# Patient Record
Sex: Female | Born: 1935 | Race: Black or African American | Hispanic: No | Marital: Single | Smoking: Never smoker
Health system: Southern US, Community
[De-identification: ages and names within clinical notes are randomized; demographics above are authoritative.]

## PROBLEM LIST (undated history)

## (undated) DIAGNOSIS — I10 Essential (primary) hypertension: Secondary | ICD-10-CM

## (undated) DIAGNOSIS — I4891 Unspecified atrial fibrillation: Secondary | ICD-10-CM

## (undated) DIAGNOSIS — I8290 Acute embolism and thrombosis of unspecified vein: Secondary | ICD-10-CM

## (undated) DIAGNOSIS — I639 Cerebral infarction, unspecified: Secondary | ICD-10-CM

## (undated) DIAGNOSIS — D649 Anemia, unspecified: Secondary | ICD-10-CM

## (undated) DIAGNOSIS — M1990 Unspecified osteoarthritis, unspecified site: Secondary | ICD-10-CM

## (undated) DIAGNOSIS — I82409 Acute embolism and thrombosis of unspecified deep veins of unspecified lower extremity: Secondary | ICD-10-CM

## (undated) DIAGNOSIS — E785 Hyperlipidemia, unspecified: Secondary | ICD-10-CM

## (undated) DIAGNOSIS — I2510 Atherosclerotic heart disease of native coronary artery without angina pectoris: Secondary | ICD-10-CM

## (undated) DIAGNOSIS — K219 Gastro-esophageal reflux disease without esophagitis: Secondary | ICD-10-CM

## (undated) HISTORY — DX: Hyperlipidemia, unspecified: E78.5

## (undated) HISTORY — DX: Unspecified atrial fibrillation: I48.91

## (undated) HISTORY — DX: Atherosclerotic heart disease of native coronary artery without angina pectoris: I25.10

## (undated) HISTORY — DX: Acute embolism and thrombosis of unspecified vein: I82.90

## (undated) HISTORY — DX: Acute embolism and thrombosis of unspecified deep veins of unspecified lower extremity: I82.409

## (undated) HISTORY — DX: Cerebral infarction, unspecified: I63.9

## (undated) HISTORY — PX: CHOLECYSTECTOMY: SHX55

## (undated) HISTORY — DX: Essential (primary) hypertension: I10

## (undated) HISTORY — DX: Unspecified osteoarthritis, unspecified site: M19.90

## (undated) HISTORY — PX: OTHER SURGICAL HISTORY: SHX169

---

## 1962-12-05 HISTORY — PX: ABDOMINAL HYSTERECTOMY: SHX81

## 1998-04-07 ENCOUNTER — Encounter: Admission: RE | Admit: 1998-04-07 | Discharge: 1998-04-07 | Payer: Self-pay | Admitting: Family Medicine

## 1998-04-09 ENCOUNTER — Ambulatory Visit (HOSPITAL_COMMUNITY): Admission: RE | Admit: 1998-04-09 | Discharge: 1998-04-09 | Payer: Self-pay | Admitting: *Deleted

## 1998-04-20 ENCOUNTER — Encounter: Admission: RE | Admit: 1998-04-20 | Discharge: 1998-04-20 | Payer: Self-pay | Admitting: Family Medicine

## 1998-04-21 ENCOUNTER — Encounter: Admission: RE | Admit: 1998-04-21 | Discharge: 1998-04-21 | Payer: Self-pay | Admitting: Family Medicine

## 1998-07-02 ENCOUNTER — Encounter: Admission: RE | Admit: 1998-07-02 | Discharge: 1998-07-02 | Payer: Self-pay | Admitting: Family Medicine

## 1998-07-03 ENCOUNTER — Ambulatory Visit: Admission: RE | Admit: 1998-07-03 | Discharge: 1998-07-03 | Payer: Self-pay | Admitting: Sports Medicine

## 1998-08-06 ENCOUNTER — Ambulatory Visit (HOSPITAL_COMMUNITY): Admission: RE | Admit: 1998-08-06 | Discharge: 1998-08-06 | Payer: Self-pay | Admitting: Cardiovascular Disease

## 1998-09-01 ENCOUNTER — Encounter: Admission: RE | Admit: 1998-09-01 | Discharge: 1998-09-01 | Payer: Self-pay | Admitting: Family Medicine

## 1999-12-21 ENCOUNTER — Encounter: Admission: RE | Admit: 1999-12-21 | Discharge: 1999-12-21 | Payer: Self-pay | Admitting: Sports Medicine

## 1999-12-27 ENCOUNTER — Encounter: Admission: RE | Admit: 1999-12-27 | Discharge: 1999-12-27 | Payer: Self-pay | Admitting: Family Medicine

## 2000-01-05 ENCOUNTER — Encounter: Admission: RE | Admit: 2000-01-05 | Discharge: 2000-01-05 | Payer: Self-pay | Admitting: Family Medicine

## 2000-01-07 ENCOUNTER — Encounter: Admission: RE | Admit: 2000-01-07 | Discharge: 2000-01-07 | Payer: Self-pay | Admitting: Family Medicine

## 2000-01-13 ENCOUNTER — Encounter: Admission: RE | Admit: 2000-01-13 | Discharge: 2000-01-13 | Payer: Self-pay | Admitting: Family Medicine

## 2000-01-13 ENCOUNTER — Other Ambulatory Visit: Admission: RE | Admit: 2000-01-13 | Discharge: 2000-01-13 | Payer: Self-pay | Admitting: *Deleted

## 2000-01-25 ENCOUNTER — Encounter: Admission: RE | Admit: 2000-01-25 | Discharge: 2000-01-25 | Payer: Self-pay | Admitting: *Deleted

## 2000-01-25 ENCOUNTER — Encounter: Payer: Self-pay | Admitting: Sports Medicine

## 2002-10-29 ENCOUNTER — Encounter: Payer: Self-pay | Admitting: Emergency Medicine

## 2002-10-29 ENCOUNTER — Emergency Department (HOSPITAL_COMMUNITY): Admission: EM | Admit: 2002-10-29 | Discharge: 2002-10-29 | Payer: Self-pay | Admitting: Emergency Medicine

## 2004-01-12 ENCOUNTER — Encounter: Admission: RE | Admit: 2004-01-12 | Discharge: 2004-01-12 | Payer: Self-pay | Admitting: Cardiovascular Disease

## 2005-09-30 ENCOUNTER — Inpatient Hospital Stay (HOSPITAL_COMMUNITY): Admission: EM | Admit: 2005-09-30 | Discharge: 2005-10-07 | Payer: Self-pay | Admitting: Family Medicine

## 2007-04-26 ENCOUNTER — Emergency Department (HOSPITAL_COMMUNITY): Admission: EM | Admit: 2007-04-26 | Discharge: 2007-04-26 | Payer: Self-pay | Admitting: Emergency Medicine

## 2007-12-10 ENCOUNTER — Inpatient Hospital Stay (HOSPITAL_COMMUNITY): Admission: EM | Admit: 2007-12-10 | Discharge: 2007-12-15 | Payer: Self-pay | Admitting: *Deleted

## 2007-12-13 ENCOUNTER — Ambulatory Visit: Payer: Self-pay | Admitting: Vascular Surgery

## 2011-04-22 NOTE — Discharge Summary (Signed)
[name], [name]             ACCOUNT NO.:  [id number]   MEDICAL RECORD NO.:  [id number]          PATIENT TYPE:  INP   LOCATION:  3728                         FACILITY:  [facility]   PHYSICIAN:  Ricki Rodriguez, M.D.  DATE OF BIRTH:  [birth year]   DATE OF ADMISSION:  09/30/2005  DATE OF DISCHARGE:  10/07/2005                                 DISCHARGE SUMMARY   PRINCIPAL DIAGNOSES:  1.  Lower extremity venous thromboembolism.  2.  Paroxysmal ventricular tachycardia.  3.  Hypertension.  4.  Anemia.  5.  Long-term use of anticoagulant.  6.  Constipation.   DISCHARGE MEDICATIONS:  1.  Lipitor 20 mg, one daily.  2.  K-Dur 10 mEq, one daily.  3.  Lotensin 5 mg, one daily.  4.  Coumadin 5 mg, one daily.  5.  Lasix 20 mg on Monday, Wednesday and Friday.  6.  Ferrous sulfate 325 mg, one twice daily.  7.  Prevacid 15 mg daily.  8.  Lasix is now up the dose and will be three times a week.  9.  Continue a multivitamin one daily.  10. Tums, two daily.  11. Actonel 35 mg, one a week.  12. Pain management with Darvocet-N 100, up to four times daily p.r.n.   NOTATION:  The patient is to stop Benicar and Lotrel.   DIET:  A low-fat, low-salt, non-vitamin K diet.  No green vegetables.   ACTIVITY:  Increase activity slowly.  Use crutches as needed.   WOUND CARE:  Not applicable.   SPECIAL INSTRUCTIONS:  Get a prothrombin time in three days, in one week and  in one month.   FOLLOWUP:  Follow up with Dr. Ricki Rodriguez in 10 days.  The patient will  call [phone] for an appointment.   HISTORY OF PRESENT ILLNESS:  This 77 year old black female comes in with a  three-day history of left leg swelling and pain without chest pain,  shortness of breath or sweating spell.   PHYSICAL EXAMINATION:  VITAL SIGNS:  Pulse 60, respirations 14, blood  pressure 130/90.  The patient is 5 feet 7 inches tall and weighs 172 pounds.  GENERAL:  The patient was alert and oriented x3.  HEENT:  The patient  is normocephalic and atraumatic.  She wears glasses.  She has brown eyes.  Conjunctivae pink.  Sclerae white.  NECK:  No jugular vein distention, bilateral carotid bruit, left more than  the right.  Has a full range of motion of the neck.  LUNGS:  Clear bilaterally.  HEART:  Normal S1, S2, with a grade 2/6 systolic ejection murmur.  ABDOMEN:  Soft, nontender.  EXTREMITIES:  With 1-2+ edema on the right and 2-3+ edema of the left leg  with warmth and tenderness over the calf area.  No cyanosis or clubbing.  Severe arthritic changes of the left knee.  CNS:  The cranial nerves are grossly intact. Bilateral good grips.  The  patient is right-handed.   LABORATORY DATA:  Hemoglobin 10.2, hematocrit 30.  Normal WBC count,  platelet count.  Normal PT, INR.  Subsequent PT was 22  with an INR of 1.9.  Electrolytes are normal.  Glucose borderline 101.  Creatinine borderline at  1.3.  BUN 23.  Lipid profile showed a total cholesterol of 157,  triglycerides 71, HDL cholesterol 49, LDL cholesterol of 94.  TSH 2.7.  Iron  level low at 35 with 19% saturation.   Her chest x-ray showed no active disease.   Electrocardiogram showed a sinus bradycardia with an anteroseptal  infarction.   Doppler of the left lower extremity showed evidence of a deep venous  thrombosis in the posterior tibial vein to popliteal vein to the mid-femoral  vein.   HOSPITAL COURSE:  The patient was admitted to the telemetry unit.  She was  started on subcutaneous Lovenox 80 mg b.i.d. Her condition improved over 72  hours of hospitalization.  Her Coumadin dose was adjusted to increase the  INR to 1.9.  With evidence of bradycardia, her Toprol was discontinued on  October 06, 2005.  The ferrous sulfate was added for iron deficiency anemia.   DISPOSITION:  The patient will be encouraged to have an outpatient GI  consultation.  On October 07, 2005, she was discharged home in satisfactory  condition, with a significant  improvement in her left leg swelling and  tenderness.  She will be followed by me and will have periodic prothrombin  time checked.      Ricki Rodriguez, M.D.  Electronically Signed     ASK/MEDQ  D:  01/05/2006  T:  01/06/2006  Job:  [id number]

## 2011-04-22 NOTE — H&P (Signed)
NAMEKIMBERLIN, SCHEEL             ACCOUNT NO.:  [id number]   MEDICAL RECORD NO.:  [id number]          PATIENT TYPE:  OBV   LOCATION:  3728                         FACILITY:  [facility]   PHYSICIAN:  Ricki Rodriguez, M.D.  DATE OF BIRTH:  06/17/1925   DATE OF ADMISSION:  09/30/2005  DATE OF DISCHARGE:                                HISTORY & PHYSICAL   CHIEF COMPLAINT:  Left leg pain.   HISTORY OF PRESENT ILLNESS:  This 77 year old black female has a 3-day  history of leg swelling and pain without chest pain, shortness of breath, or  sweating spells.   PAST MEDICAL HISTORY:  Negative for diabetes. Positive for hypertension for  25 years. No history of smoking, no history of alcohol intake. Positive  history of elevated cholesterol level, myocardial infarction, and family  history of premature coronary artery disease.   PAST SURGICAL HISTORY:  Includes total hysterectomy in 1964 and cardiac  catheterization in 1999.   CURRENT MEDICATIONS:  1.  Enteric-coated aspirin 325 mg daily.  2.  Tylenol one daily.  3.  Multivitamin one daily.  4.  Tums one daily.  5.  Lotrel 5/10 mg one daily.  6.  Benicar 20 mg one daily.  7.  Lasix 40 mg one daily.  8.  K-Dur 10 mEq one daily.  9.  Lipitor 10 mg one daily.  10. Prevacid 15 mg one daily.  11. Darvocet-N 100 one four times daily as needed.  12. Actonel 35 mg one a week.   ALLERGIES:  Some ANTIBIOTIC a long time ago; otherwise, no known drug  allergies.   PERSONAL HISTORY:  The patient is single. Had three sons, two living age of  31 and 84, one died. Had one daughter stillborn and one daughter deceased,  one living.   FAMILY HISTORY:  Mother and father died of old age. Had two brothers, both  of whom died; one died 2 years ago, one died 10 years ago. Has one sister  who is living.   REVIEW OF SYSTEMS:  Positive vision change with the patient wearing glasses.  Negative weight gain or weight loss. Negative hearing loss. Positive  denture  use. Negative asthma, bronchitis, hemoptysis, or pneumonia. Positive  palpitations, chest pain, congestive heart failure with leg edema. Negative  GI bleed, peptic ulcer disease, hepatitis, blood transfusion. Negative  kidney stone, stroke, seizures, or psychiatric admissions.   PHYSICAL EXAMINATION:  VITAL SIGNS:  Pulse 60, respirations 14, blood  pressure 130/90, height 5 feet 7 inches, weight 172 pounds approximately.  GENERAL:  The patient was alert, oriented x3.  HEENT:  The patient is normocephalic, atraumatic, wears glasses, brown eyes.  Conjunctivae pink sclerae white.  NECK:  No JVD.  Bilateral carotid bruit, left more than the right. Has full  range of motion on the neck.  LUNGS:  Clear bilaterally.  HEART:  Normal S1, S2, with a grade 2/6 systolic ejection murmur.  ABDOMEN:  Soft and nontender.  EXTREMITIES:  Show 1-2+ edema right and 2-3+ edema on the left leg, with  warm and tenderness over the calf area. No cyanosis or  clubbing. Severe  arthritic changes of the left knee.  CENTRAL NERVOUS SYSTEM:  Cranial nerves grossly intact, bilateral equal  grips. The patient is right-handed.   LABORATORY DATA:  Pending.   Doppler of the left lower extremity positive for deep venous thrombosis.   ASSESSMENT:  1.  Left leg thrombophlebitis.  2.  Hypertension.  3.  Hyperlipidemia.  4.  Bilateral knee pain.  5.  Anemia.   PLAN:  Start subcutaneous Lovenox 80 mg every 12 hours and continue home  medications.      Ricki Rodriguez, M.D.  Electronically Signed     ASK/MEDQ  D:  09/30/2005  T:  10/01/2005  Job:  [id number]

## 2011-04-22 NOTE — Discharge Summary (Signed)
[name], [name]             ACCOUNT NO.:  [id number]   MEDICAL RECORD NO.:  [id number]          PATIENT TYPE:  INP   LOCATION:  6526                         FACILITY:  [facility]   PHYSICIAN:  Ricki Rodriguez, M.D.  DATE OF BIRTH:  [birth year]   DATE OF ADMISSION:  12/10/2007  DATE OF DISCHARGE:  12/15/2007                               DISCHARGE SUMMARY   FINAL DIAGNOSES:  1. Atrial fibrillation.  2. Urinary tract infection.  3. Hypertension.  4. Hyperlipidemia.  5. Contusion of the face.  6. Fall.   DISCHARGE MEDICATIONS:  1. Cipro 250 mg twice daily x5 days.  2. Protonix 40 mg daily.  3. Lipitor 10 mg in the evening.  4. Coumadin 2.5 mg in the evening.  5. Diltiazem 180 mg extended release one daily in the morning.  6. Toprol XL 25 mg one daily in the evening.  7. Tylenol 650 mg up to four times daily as needed.  8. Lasix (furosemide) 20 mg once or twice a week as needed for leg      swelling.  9. KCl 10 mEq once or twice a week as needed with Lasix use.   DISCHARGE DIET:  Low-sodium heart-healthy diet.   DISCHARGE ACTIVITY:  The patient to walk with assistance.   SPECIAL INSTRUCTIONS:  The patient to stop any activity that causes  chest pain, shortness of breath, dizziness, sweating or excessive  weakness.   FOLLOWUP:  With Dr. Orpah Cobb in 1 week.  The patient or family to  call [phone] for appointment.   HISTORY:  This 77 year old white female presented with a fall.  EMS  found heart rate into the 140s.  The patient has a history of atrial  fibrillation with high-grade AV block in the past.  Her past history is  positive for hypertension for 27 years, elevated cholesterol level.   PHYSICAL EXAMINATION:  VITAL SIGNS:  Temperature 97, pulse 75-140,  respirations 16, blood pressure 122/81.  GENERAL:  The patient is averagely built and nourished.  HEENT:  The patient has brown eyes.  Pupils equal, round and reactive to  light.  Conjunctivae pink.  Sclerae are  nonicteric.  The patient wears  glasses.  NECK:  Negative JVD.  Positive bruit bilaterally.  LUNGS:  Clear bilaterally.  HEART:  Normal S1-S2 with grade 3/6 systolic murmur.  ABDOMEN:  Soft.  EXTREMITIES:  1+ ankle edema.  Positive arthritic changes in both hands  and feet.  SKIN:  Warm and dry.  NEUROLOGICALLY:  The patient was grossly intact.  Had bilateral equal  grips and is right-handed.   LABORATORY DATA:  Near normal electrolytes, BUN, creatinine.  Normal  hemoglobin, hematocrit, WBC count, platelet count.  Urinalysis negative  except for protein.  Cardiac enzymes normal.   EKG with atrial fibrillation with rapid ventricular response.  Post  medication EKG showed atrial fibrillation with controlled ventricular  response.   Echocardiogram showed mildly decreased LV systolic function with a  diastolic dysfunction, moderate mitral regurgitation.   HOSPITAL COURSE:  The patient was admitted to the telemetry unit.  She  was started on Cardizem  and her home medications.  Her heart rate was  well-controlled.  The patient complained of some weakness.  Had  occasional bradyarrhythmias.  Hence, Cardizem and beta-blocker doses  were decreased.  She was started on Coumadin.  She was also given  physical therapy for ambulation.  From her fall, she had occipital scalp  hematoma.  Otherwise, her CT brain was negative.  She had a lower  extremity venous Doppler with no evidence of DVT.  She was started on  Cipro 250 mg b.i.d. for her urinary tract infection, and on December 15, 2007, she was discharged home in satisfactory condition with follow up  by me in 1 week.      Ricki Rodriguez, M.D.  Electronically Signed     ASK/MEDQ  D:  01/23/2008  T:  01/24/2008  Job:  [id number]

## 2011-05-17 ENCOUNTER — Other Ambulatory Visit: Payer: Self-pay | Admitting: Cardiovascular Disease

## 2011-05-17 ENCOUNTER — Ambulatory Visit
Admission: RE | Admit: 2011-05-17 | Discharge: 2011-05-17 | Disposition: A | Discharge status: Home / Self Care | Payer: Medicaid Other | Source: Ambulatory Visit | Attending: Cardiovascular Disease | Admitting: Cardiovascular Disease

## 2011-05-17 DIAGNOSIS — R1110 Vomiting, unspecified: Secondary | ICD-10-CM

## 2011-05-28 ENCOUNTER — Emergency Department (HOSPITAL_COMMUNITY)
Admission: EM | Admit: 2011-05-28 | Discharge: 2011-05-28 | Disposition: A | Discharge status: Home / Self Care | Payer: PRIVATE HEALTH INSURANCE | Attending: Emergency Medicine | Admitting: Emergency Medicine

## 2011-05-28 ENCOUNTER — Emergency Department (HOSPITAL_COMMUNITY): Payer: PRIVATE HEALTH INSURANCE

## 2011-05-28 DIAGNOSIS — R112 Nausea with vomiting, unspecified: Secondary | ICD-10-CM | POA: Insufficient documentation

## 2011-05-28 DIAGNOSIS — M129 Arthropathy, unspecified: Secondary | ICD-10-CM | POA: Insufficient documentation

## 2011-05-28 DIAGNOSIS — R109 Unspecified abdominal pain: Secondary | ICD-10-CM | POA: Insufficient documentation

## 2011-05-28 DIAGNOSIS — K8020 Calculus of gallbladder without cholecystitis without obstruction: Secondary | ICD-10-CM | POA: Insufficient documentation

## 2011-05-28 DIAGNOSIS — I2510 Atherosclerotic heart disease of native coronary artery without angina pectoris: Secondary | ICD-10-CM | POA: Insufficient documentation

## 2011-05-28 DIAGNOSIS — I10 Essential (primary) hypertension: Secondary | ICD-10-CM | POA: Insufficient documentation

## 2011-05-28 LAB — COMPREHENSIVE METABOLIC PANEL
Albumin: 3.1 g/dL — ABNORMAL LOW (ref 3.5–5.2)
Alkaline Phosphatase: 77 U/L (ref 39–117)
BUN: 11 mg/dL (ref 6–23)
CO2: 26 mEq/L (ref 19–32)
Chloride: 101 mEq/L (ref 96–112)
Creatinine, Ser: 1.14 mg/dL — ABNORMAL HIGH (ref 0.50–1.10)
GFR calc Af Amer: 55 mL/min — ABNORMAL LOW (ref >60)
GFR calc non Af Amer: 45 mL/min — ABNORMAL LOW (ref >60)
Glucose, Bld: 94 mg/dL (ref 70–99)
Potassium: 3.6 mEq/L (ref 3.5–5.1)
Total Bilirubin: 0.4 mg/dL (ref 0.3–1.2)

## 2011-05-28 LAB — CBC
HCT: 36.7 % (ref 36.0–46.0)
Hemoglobin: 12.4 g/dL (ref 12.0–15.0)
MCV: 76.9 fL — ABNORMAL LOW (ref 78.0–100.0)
RDW: 14.6 % (ref 11.5–15.5)
WBC: 9.7 10*3/uL (ref 4.0–10.5)

## 2011-05-28 LAB — DIFFERENTIAL
Basophils Absolute: 0 10*3/uL (ref 0.0–0.1)
Eosinophils Relative: 0 % (ref 0–5)
Lymphocytes Relative: 26 % (ref 12–46)
Lymphs Abs: 2.5 10*3/uL (ref 0.7–4.0)
Monocytes Absolute: 0.5 10*3/uL (ref 0.1–1.0)
Neutro Abs: 6.7 10*3/uL (ref 1.7–7.7)

## 2011-05-28 LAB — URINALYSIS, ROUTINE W REFLEX MICROSCOPIC
Bilirubin Urine: NEGATIVE
Leukocytes, UA: NEGATIVE
Nitrite: NEGATIVE
Protein, ur: >300 mg/dL — AB
Urobilinogen, UA: 1 mg/dL (ref 0.0–1.0)
pH: 6 (ref 5.0–8.0)

## 2011-05-28 LAB — URINE MICROSCOPIC-ADD ON

## 2011-05-28 LAB — LIPASE, BLOOD: Lipase: 18 U/L (ref 11–59)

## 2011-06-01 ENCOUNTER — Encounter (INDEPENDENT_AMBULATORY_CARE_PROVIDER_SITE_OTHER): Payer: Self-pay | Admitting: General Surgery

## 2011-06-01 ENCOUNTER — Ambulatory Visit (INDEPENDENT_AMBULATORY_CARE_PROVIDER_SITE_OTHER): Payer: Medicare Other | Admitting: General Surgery

## 2011-06-01 DIAGNOSIS — K8020 Calculus of gallbladder without cholecystitis without obstruction: Secondary | ICD-10-CM

## 2011-06-01 DIAGNOSIS — Z972 Presence of dental prosthetic device (complete) (partial): Secondary | ICD-10-CM

## 2011-06-01 DIAGNOSIS — I519 Heart disease, unspecified: Secondary | ICD-10-CM

## 2011-06-01 DIAGNOSIS — E785 Hyperlipidemia, unspecified: Secondary | ICD-10-CM | POA: Insufficient documentation

## 2011-06-01 DIAGNOSIS — E7800 Pure hypercholesterolemia, unspecified: Secondary | ICD-10-CM

## 2011-06-01 DIAGNOSIS — I10 Essential (primary) hypertension: Secondary | ICD-10-CM | POA: Insufficient documentation

## 2011-06-01 DIAGNOSIS — H409 Unspecified glaucoma: Secondary | ICD-10-CM | POA: Insufficient documentation

## 2011-06-01 DIAGNOSIS — R109 Unspecified abdominal pain: Secondary | ICD-10-CM | POA: Insufficient documentation

## 2011-06-01 DIAGNOSIS — D509 Iron deficiency anemia, unspecified: Secondary | ICD-10-CM | POA: Insufficient documentation

## 2011-06-01 DIAGNOSIS — D649 Anemia, unspecified: Secondary | ICD-10-CM

## 2011-06-01 DIAGNOSIS — R634 Abnormal weight loss: Secondary | ICD-10-CM

## 2011-06-01 DIAGNOSIS — R112 Nausea with vomiting, unspecified: Secondary | ICD-10-CM | POA: Insufficient documentation

## 2011-06-01 DIAGNOSIS — Z803 Family history of malignant neoplasm of breast: Secondary | ICD-10-CM | POA: Insufficient documentation

## 2011-06-01 DIAGNOSIS — M1990 Unspecified osteoarthritis, unspecified site: Secondary | ICD-10-CM

## 2011-06-01 NOTE — Progress Notes (Signed)
Subjective:     Patient ID: [name], female   DOB: Dec 23, 1924, 77 y.o.   MRN: [id number]    BP 118/76  Pulse 64  Temp(Src) 96.4 F (35.8 C) (Temporal)  Ht 5\' 4"  (1.626 m)  Wt 141 lb 3.2 oz (64.048 kg)  BMI 24.24 kg/m2    HPI  The patient complains of a one to two-month history of intermittent nausea and vomiting.Nothing seems to make it better or worse. The frequency discomfort has been increasing and now occurs up to every other day. The patient's primary care physician and lab work and x-rays were done on May 17, 2011. These were reportedly normal. The patient did have symptoms in weight loss and was seen in emergency department 4 days ago. She underwent ultrasound which reportedly showed multiple gallstones. Review of Systems  Constitutional: Positive for appetite change and unexpected weight change.  HENT: Negative.   Eyes: Negative.   Respiratory: Negative.   Cardiovascular: Negative.   Gastrointestinal: Positive for nausea, vomiting and abdominal pain. Negative for abdominal distention.  Genitourinary: Negative.   Musculoskeletal: Negative.   Neurological: Negative.   Hematological: Negative.   Psychiatric/Behavioral: Negative.        Objective:   Physical Exam  Constitutional: She appears well-developed.  HENT:  Head: Normocephalic and atraumatic.  Eyes: Pupils are equal, round, and reactive to light. No scleral icterus.  Neck: Normal range of motion. Neck supple.  Cardiovascular: Normal rate and normal heart sounds.   Pulmonary/Chest: Effort normal and breath sounds normal. No respiratory distress. She has no wheezes.  Abdominal: Soft. She exhibits no distension and no mass. There is tenderness. There is no guarding.  Musculoskeletal: Normal range of motion.  Lymphadenopathy:    She has no cervical adenopathy.  Neurological: She is alert.  Skin: Skin is warm and dry.   Abdominal tenderness is located mostly in the right upper quadrant. She has a lower  midline scar from her hysterectomy.    Assessment:    Symptomatic cholelithiasis.    Plan:     I have offered laparoscopic cholecystectomy and intraoperative cholangiogram. Seizure, risks, benefits were discussed with the patient and her daughter. These included but were not limited to bleeding infection bowel injury bile duct injury and emergent open procedure. She is agreeable to need to obtain cardiac clearance from her primary care physician. We look forward to scheduling surgery shortly thereafter.

## 2011-06-03 ENCOUNTER — Telehealth (INDEPENDENT_AMBULATORY_CARE_PROVIDER_SITE_OTHER): Payer: Self-pay | Admitting: General Surgery

## 2011-06-03 NOTE — Telephone Encounter (Signed)
Pt asking if [facility] letter has bent sent to have surgery

## 2011-06-04 ENCOUNTER — Telehealth (INDEPENDENT_AMBULATORY_CARE_PROVIDER_SITE_OTHER): Payer: Self-pay | Admitting: General Surgery

## 2011-06-04 ENCOUNTER — Inpatient Hospital Stay (HOSPITAL_COMMUNITY)
Admission: AD | Admit: 2011-06-04 | Discharge: 2011-06-27 | DRG: 414 | Disposition: A | Discharge status: Home Health | Payer: Medicare Other | Source: Ambulatory Visit | Attending: Vascular Surgery | Admitting: Vascular Surgery

## 2011-06-04 ENCOUNTER — Inpatient Hospital Stay (HOSPITAL_COMMUNITY): Payer: Medicare Other

## 2011-06-04 DIAGNOSIS — I10 Essential (primary) hypertension: Secondary | ICD-10-CM | POA: Diagnosis present

## 2011-06-04 DIAGNOSIS — I4891 Unspecified atrial fibrillation: Secondary | ICD-10-CM | POA: Diagnosis present

## 2011-06-04 DIAGNOSIS — Z886 Allergy status to analgesic agent status: Secondary | ICD-10-CM

## 2011-06-04 DIAGNOSIS — K8020 Calculus of gallbladder without cholecystitis without obstruction: Secondary | ICD-10-CM

## 2011-06-04 DIAGNOSIS — I2510 Atherosclerotic heart disease of native coronary artery without angina pectoris: Secondary | ICD-10-CM | POA: Diagnosis present

## 2011-06-04 DIAGNOSIS — E119 Type 2 diabetes mellitus without complications: Secondary | ICD-10-CM | POA: Diagnosis present

## 2011-06-04 DIAGNOSIS — K551 Chronic vascular disorders of intestine: Secondary | ICD-10-CM | POA: Diagnosis present

## 2011-06-04 DIAGNOSIS — E860 Dehydration: Secondary | ICD-10-CM | POA: Diagnosis present

## 2011-06-04 DIAGNOSIS — D62 Acute posthemorrhagic anemia: Secondary | ICD-10-CM | POA: Diagnosis not present

## 2011-06-04 DIAGNOSIS — Z8673 Personal history of transient ischemic attack (TIA), and cerebral infarction without residual deficits: Secondary | ICD-10-CM

## 2011-06-04 DIAGNOSIS — T8110XA Postprocedural shock unspecified, initial encounter: Secondary | ICD-10-CM | POA: Diagnosis not present

## 2011-06-04 DIAGNOSIS — D72829 Elevated white blood cell count, unspecified: Secondary | ICD-10-CM | POA: Diagnosis not present

## 2011-06-04 DIAGNOSIS — K8010 Calculus of gallbladder with chronic cholecystitis without obstruction: Principal | ICD-10-CM | POA: Diagnosis present

## 2011-06-04 DIAGNOSIS — Z86718 Personal history of other venous thrombosis and embolism: Secondary | ICD-10-CM

## 2011-06-04 DIAGNOSIS — R634 Abnormal weight loss: Secondary | ICD-10-CM | POA: Diagnosis present

## 2011-06-04 DIAGNOSIS — E876 Hypokalemia: Secondary | ICD-10-CM | POA: Diagnosis not present

## 2011-06-04 DIAGNOSIS — J9600 Acute respiratory failure, unspecified whether with hypoxia or hypercapnia: Secondary | ICD-10-CM | POA: Diagnosis not present

## 2011-06-04 DIAGNOSIS — K219 Gastro-esophageal reflux disease without esophagitis: Secondary | ICD-10-CM | POA: Diagnosis present

## 2011-06-04 DIAGNOSIS — R1013 Epigastric pain: Secondary | ICD-10-CM

## 2011-06-04 DIAGNOSIS — I708 Atherosclerosis of other arteries: Secondary | ICD-10-CM | POA: Diagnosis present

## 2011-06-04 DIAGNOSIS — I252 Old myocardial infarction: Secondary | ICD-10-CM

## 2011-06-04 LAB — COMPREHENSIVE METABOLIC PANEL
Alkaline Phosphatase: 75 U/L (ref 39–117)
BUN: 16 mg/dL (ref 6–23)
Creatinine, Ser: 1.41 mg/dL — ABNORMAL HIGH (ref 0.50–1.10)
GFR calc Af Amer: 43 mL/min — ABNORMAL LOW (ref >60)
Glucose, Bld: 104 mg/dL — ABNORMAL HIGH (ref 70–99)
Potassium: 4.2 mEq/L (ref 3.5–5.1)
Total Bilirubin: 0.5 mg/dL (ref 0.3–1.2)
Total Protein: 8.3 g/dL (ref 6.0–8.3)

## 2011-06-04 LAB — CBC
HCT: 36.5 % (ref 36.0–46.0)
Hemoglobin: 13 g/dL (ref 12.0–15.0)
MCHC: 35.6 g/dL (ref 30.0–36.0)
MCV: 75.7 fL — ABNORMAL LOW (ref 78.0–100.0)
RDW: 14.6 % (ref 11.5–15.5)

## 2011-06-04 LAB — AMYLASE: Amylase: 96 U/L (ref 0–105)

## 2011-06-04 LAB — LIPASE, BLOOD: Lipase: 18 U/L (ref 11–59)

## 2011-06-05 ENCOUNTER — Inpatient Hospital Stay (HOSPITAL_COMMUNITY): Payer: Medicare Other

## 2011-06-05 MED ORDER — IOHEXOL 300 MG/ML  SOLN
80.0000 mL | Freq: Once | INTRAMUSCULAR | Status: AC | PRN
  Administered 2011-06-05: 80 mL via INTRAVENOUS

## 2011-06-06 ENCOUNTER — Other Ambulatory Visit (INDEPENDENT_AMBULATORY_CARE_PROVIDER_SITE_OTHER): Payer: Self-pay

## 2011-06-06 DIAGNOSIS — R110 Nausea: Secondary | ICD-10-CM

## 2011-06-06 MED ORDER — PROMETHAZINE HCL 12.5 MG PO TABS: 12.5000 mg | ORAL_TABLET | Freq: Four times a day (QID) | ORAL | Status: AC | PRN

## 2011-06-06 NOTE — H&P (Signed)
[name]             ACCOUNT NO.:  [id number]  MEDICAL RECORD NO.:  [id number]  LOCATION:  5155                         FACILITY:  [facility]  PHYSICIAN:  Adolph Pollack, M.D.DATE OF BIRTH:  10-23-1925  DATE OF ADMISSION:  06/04/2011 DATE OF DISCHARGE:                             HISTORY & PHYSICAL   CHIEF COMPLAINT:  Abdominal pain, nausea, and vomiting.  HISTORY:  [name] is an 77 year old female who for the past 6-8 weeks has had progressively increasing abdominal pain with nausea, vomiting particularly after meals.  She has lost a total of 22 pounds during this time.  She was given some Prilosec per primary care physician, but continued to have these symptoms.  She went to the emergency department on May 28, 2011, and was evaluated there.  At that time, she had cholelithiasis on ultrasound with no evidence of acute cholecystitis.  Liver function tests were normal as was lipase.  She was then seen by Dr. Janee Lowe on June 01, 2011 and cholecystectomy was discussed.  She needed to get cardiac clearance with Dr. Algie Coffer, which apparently she has according to her family.  However, her symptoms were progressively worsening.  She could not keep anything down and none of the medication was helping her nausea or her pain.  They called me on the phone and I directed them to come to the [facility] to be admitted.  PAST MEDICAL HISTORY: 1. Coronary artery disease. 2. Myocardial infarction. 3. Atrial fibrillation. 4. Cerebrovascular accident. 5. Hypertension. 6. Arthritis. 7. Lower extremity DVT. 8. Hyperlipidemia.  PREVIOUS OPERATION:  Hysterectomy.  ALLERGIES:  MORPHINE.  MEDICATIONS: 1. Multivitamin. 2. Iron. 3. Potassium chloride 10 mEq daily. 4. Cartia 180 mEq a day. 5. Furosemide 20 mg a day. 6. Omeprazole 20 mg twice a day. 7. Actonel 35 mg weekly. 8. Promethazine p.r.n. 9. Lisinopril 5 mg daily. 10.Atorvastatin 10 mg in p.m. 11.CoQ10.  SOCIAL  HISTORY:  She lives with her family.  She used to dip snuff.  No alcohol use.  REVIEW OF SYSTEMS:  CONSTITUTIONAL:  Weight loss as mentioned above. CARDIOVASCULAR:  As per past medical history.  PULMONARY:  Denies any asthma, COPD, or pneumonia.  GI:  No peptic ulcer disease, hepatitis. GU:  No kidney stones.  ENDOCRINE:  No diabetes.  HEMATOLOGIC:  No blood transfusions.  NEUROLOGIC:  No seizures.  PHYSICAL EXAMINATION:  GENERAL:  Elderly weak-appearing appearing female, but she is in no acute distress currently and very pleasant and cooperative. VITAL SIGNS:  Temperature is 97.9, blood pressure 103/72, pulse 72, respiratory rate 22, O2 sats 100% on room air. EYES:  Wears glasses.  Extraocular motion intact.  No icterus. NECK:  Supple without obvious mass. RESPIRATORY:  Breath sounds distant, but equal and clear. CARDIOVASCULAR:  Regular rate, regular rhythm with trace pedal edema. No JVD. ABDOMEN:  Soft with lower midline scar present.  There is some mild epigastric and right upper quadrant tenderness.  No obvious mass palpable.  No hernia. MUSCULOSKELETAL:  Has some trace pretibial edema as mentioned before.  Laboratory data has been ordered.  However, on May 28, 2011, her liver function tests and lipase were normal.  Ultrasound from May 28, 2011 demonstrates  cholelithiasis with no evidence of acute cholecystitis.  Common bile duct diameter 6-7 mm which was reportedly normal for age.  IMPRESSION:  Epigastric pain with cholelithiasis and 22-pound weight loss.  Symptoms could be explained by cholelithiasis; however, given her age and significant weight loss over a short period of time, I think we need to rule out other occult possibilities.  PLAN:  We will admit to the hospital.  Start IV fluid hydration and placed on bowel rest.  We will check laboratory data.  We will check CT of the abdomen and pelvis as well.  If nothing else shows up, but I think we could proceed with  laparoscopic cholecystectomy.  I have discussed the procedure and risks with her and her family.  The risks include but are not limited to bleeding, infection, wound healing problems, anesthesia, bile leak, accidental injury to the bile duct/liver/intestine, and postprandial diarrhea.  They seemed to understand and agreed with the plan.     Adolph Pollack, M.D.     Kari Baars  D:  06/04/2011  T:  06/05/2011  Job:  [id number]  cc:   Ricki Rodriguez, M.D.  Electronically Signed by Avel Peace M.D. on 06/06/2011 09:27:16 AM

## 2011-06-06 NOTE — Telephone Encounter (Signed)
Patient's daughter called after hours and Dr Ezzard Standing was paged.  We were still in clinic so I returned the call.  Daughter Hollie Salk states her mother is having pain and nausea when she eats.  She is eating small, soft meals and vomits them back up.  The Zofran is not helping the nausea and Tylenol is not helping the pain.  She is allergic to Morphine and uses [facility] on [address] [phone].  I addressed this with Dr Ezzard Standing and he advised  Order Phenergen 12.5mg  po 1 q 6hrs prn nausea #15 no refill.  She can take Aspirin or Advil for the pain.  I called the Phenergen in to [facility] and left instructions on the daughter's voicemail.

## 2011-06-07 ENCOUNTER — Inpatient Hospital Stay (HOSPITAL_COMMUNITY): Payer: Medicare Other

## 2011-06-07 LAB — BASIC METABOLIC PANEL
BUN: 7 mg/dL (ref 6–23)
Calcium: 8.1 mg/dL — ABNORMAL LOW (ref 8.4–10.5)
Creatinine, Ser: 0.95 mg/dL (ref 0.50–1.10)
GFR calc Af Amer: >60 mL/min (ref >60)
GFR calc non Af Amer: 56 mL/min — ABNORMAL LOW (ref >60)

## 2011-06-07 MED ORDER — IOHEXOL 350 MG/ML SOLN
200.0000 mL | Freq: Once | INTRAVENOUS | Status: AC | PRN
  Administered 2011-06-07: 200 mL via INTRAVENOUS

## 2011-06-08 LAB — BASIC METABOLIC PANEL
Chloride: 103 mEq/L (ref 96–112)
GFR calc Af Amer: >60 mL/min (ref >60)
GFR calc non Af Amer: >60 mL/min (ref >60)
Potassium: 4.5 mEq/L (ref 3.5–5.1)

## 2011-06-08 LAB — CBC
MCHC: 34 g/dL (ref 30.0–36.0)
Platelets: 188 10*3/uL (ref 150–400)
RDW: 14.6 % (ref 11.5–15.5)
WBC: 9.2 10*3/uL (ref 4.0–10.5)

## 2011-06-09 DIAGNOSIS — K551 Chronic vascular disorders of intestine: Secondary | ICD-10-CM

## 2011-06-09 LAB — BASIC METABOLIC PANEL
BUN: 5 mg/dL — ABNORMAL LOW (ref 6–23)
Chloride: 102 mEq/L (ref 96–112)
GFR calc Af Amer: >60 mL/min (ref >60)
GFR calc non Af Amer: >60 mL/min (ref >60)
Glucose, Bld: 107 mg/dL — ABNORMAL HIGH (ref 70–99)
Potassium: 3.7 mEq/L (ref 3.5–5.1)
Sodium: 135 mEq/L (ref 135–145)

## 2011-06-09 LAB — CBC
HCT: 33.7 % — ABNORMAL LOW (ref 36.0–46.0)
Hemoglobin: 11.8 g/dL — ABNORMAL LOW (ref 12.0–15.0)
MCHC: 35 g/dL (ref 30.0–36.0)
RDW: 14.5 % (ref 11.5–15.5)
WBC: 9.1 10*3/uL (ref 4.0–10.5)

## 2011-06-09 LAB — URINALYSIS, ROUTINE W REFLEX MICROSCOPIC
Bilirubin Urine: NEGATIVE
Leukocytes, UA: NEGATIVE
Nitrite: NEGATIVE
Specific Gravity, Urine: 1.011 (ref 1.005–1.030)
Urobilinogen, UA: 1 mg/dL (ref 0.0–1.0)
pH: 6 (ref 5.0–8.0)

## 2011-06-09 LAB — URINE MICROSCOPIC-ADD ON

## 2011-06-10 DIAGNOSIS — K551 Chronic vascular disorders of intestine: Secondary | ICD-10-CM

## 2011-06-10 DIAGNOSIS — I739 Peripheral vascular disease, unspecified: Secondary | ICD-10-CM

## 2011-06-11 LAB — CBC
HCT: 33.7 % — ABNORMAL LOW (ref 36.0–46.0)
Hemoglobin: 11.7 g/dL — ABNORMAL LOW (ref 12.0–15.0)
MCH: 26.4 pg (ref 26.0–34.0)
MCHC: 34.7 g/dL (ref 30.0–36.0)
MCV: 76.1 fL — ABNORMAL LOW (ref 78.0–100.0)
RDW: 14.6 % (ref 11.5–15.5)

## 2011-06-11 LAB — BASIC METABOLIC PANEL
BUN: 6 mg/dL (ref 6–23)
CO2: 25 mEq/L (ref 19–32)
Calcium: 8.4 mg/dL (ref 8.4–10.5)
Creatinine, Ser: 0.76 mg/dL (ref 0.50–1.10)
GFR calc non Af Amer: >60 mL/min (ref >60)
Glucose, Bld: 114 mg/dL — ABNORMAL HIGH (ref 70–99)
Sodium: 139 mEq/L (ref 135–145)

## 2011-06-12 LAB — CBC
MCH: 26.1 pg (ref 26.0–34.0)
MCHC: 34.5 g/dL (ref 30.0–36.0)
MCV: 75.6 fL — ABNORMAL LOW (ref 78.0–100.0)
Platelets: 259 10*3/uL (ref 150–400)
RBC: 4.1 MIL/uL (ref 3.87–5.11)

## 2011-06-13 ENCOUNTER — Inpatient Hospital Stay (HOSPITAL_COMMUNITY): Payer: Medicare Other

## 2011-06-13 ENCOUNTER — Other Ambulatory Visit: Payer: Self-pay | Admitting: Vascular Surgery

## 2011-06-13 DIAGNOSIS — A419 Sepsis, unspecified organism: Secondary | ICD-10-CM

## 2011-06-13 DIAGNOSIS — J9600 Acute respiratory failure, unspecified whether with hypoxia or hypercapnia: Secondary | ICD-10-CM

## 2011-06-13 DIAGNOSIS — K551 Chronic vascular disorders of intestine: Secondary | ICD-10-CM

## 2011-06-13 DIAGNOSIS — K55059 Acute (reversible) ischemia of intestine, part and extent unspecified: Secondary | ICD-10-CM

## 2011-06-13 DIAGNOSIS — K8010 Calculus of gallbladder with chronic cholecystitis without obstruction: Secondary | ICD-10-CM

## 2011-06-13 LAB — APTT: aPTT: 108 seconds — ABNORMAL HIGH (ref 24–37)

## 2011-06-13 LAB — CBC
HCT: 38.2 % (ref 36.0–46.0)
Hemoglobin: 13.8 g/dL (ref 12.0–15.0)
MCH: 26.7 pg (ref 26.0–34.0)
MCHC: 35.4 g/dL (ref 30.0–36.0)
MCHC: 36.1 g/dL — ABNORMAL HIGH (ref 30.0–36.0)
MCV: 75.5 fL — ABNORMAL LOW (ref 78.0–100.0)
Platelets: 264 10*3/uL (ref 150–400)
RBC: 4.08 MIL/uL (ref 3.87–5.11)
RBC: 4.78 MIL/uL (ref 3.87–5.11)

## 2011-06-13 LAB — BASIC METABOLIC PANEL
BUN: 4 mg/dL — ABNORMAL LOW (ref 6–23)
BUN: 4 mg/dL — ABNORMAL LOW (ref 6–23)
Chloride: 107 mEq/L (ref 96–112)
Chloride: 106 mEq/L (ref 96–112)
GFR calc Af Amer: >60 mL/min (ref >60)
GFR calc Af Amer: >60 mL/min (ref >60)
Glucose, Bld: 99 mg/dL (ref 70–99)
Potassium: 3.7 mEq/L (ref 3.5–5.1)
Potassium: 4.2 mEq/L (ref 3.5–5.1)
Sodium: 136 mEq/L (ref 135–145)

## 2011-06-13 LAB — GLUCOSE, CAPILLARY: Glucose-Capillary: 138 mg/dL — ABNORMAL HIGH (ref 70–99)

## 2011-06-13 LAB — MAGNESIUM: Magnesium: 1.3 mg/dL — ABNORMAL LOW (ref 1.5–2.5)

## 2011-06-13 LAB — PROTIME-INR: INR: 1.21 (ref 0.00–1.49)

## 2011-06-14 ENCOUNTER — Inpatient Hospital Stay (HOSPITAL_COMMUNITY): Payer: Medicare Other

## 2011-06-14 LAB — COMPREHENSIVE METABOLIC PANEL
ALT: 29 U/L (ref 0–35)
AST: 46 U/L — ABNORMAL HIGH (ref 0–37)
Albumin: 1.5 g/dL — ABNORMAL LOW (ref 3.5–5.2)
Alkaline Phosphatase: 53 U/L (ref 39–117)
Calcium: 7.6 mg/dL — ABNORMAL LOW (ref 8.4–10.5)
Potassium: 4 mEq/L (ref 3.5–5.1)
Sodium: 135 mEq/L (ref 135–145)
Total Protein: 4.4 g/dL — ABNORMAL LOW (ref 6.0–8.3)

## 2011-06-14 LAB — CBC
Hemoglobin: 12.8 g/dL (ref 12.0–15.0)
MCHC: 36.5 g/dL — ABNORMAL HIGH (ref 30.0–36.0)
Platelets: 179 10*3/uL (ref 150–400)
RDW: 15.7 % — ABNORMAL HIGH (ref 11.5–15.5)

## 2011-06-14 LAB — CORTISOL: Cortisol, Plasma: 17.3 ug/dL

## 2011-06-14 LAB — POCT I-STAT 4, (NA,K, GLUC, HGB,HCT)
Glucose, Bld: 125 mg/dL — ABNORMAL HIGH (ref 70–99)
HCT: 35 % — ABNORMAL LOW (ref 36.0–46.0)
Sodium: 139 mEq/L (ref 135–145)

## 2011-06-14 LAB — PHOSPHORUS: Phosphorus: 3.8 mg/dL (ref 2.3–4.6)

## 2011-06-14 NOTE — Op Note (Signed)
[name], LABINE             ACCOUNT NO.:  [id number]  MEDICAL RECORD NO.:  [id number]  LOCATION:  2313                         FACILITY:  [facility]  PHYSICIAN:  Angelia Mould. [name], M.D.DATE OF BIRTH:  March 11, 1925  DATE OF PROCEDURE:  06/13/2011 DATE OF DISCHARGE:                              OPERATIVE REPORT   PREOPERATIVE DIAGNOSIS:  Chronic cholecystitis with cholelithiasis.  POSTOPERATIVE DIAGNOSIS:  Chronic cholecystitis with cholelithiasis.  OPERATION PERFORMED:  Open cholecystectomy.  SURGEON:  Angelia Mould. [name], MD  FIRST ASSISTANT:  Janetta Hora. Fields, MD  OPERATIVE INDICATIONS:  This is an [name] with multiple medical problems including coronary artery disease, history of myocardial infarction, atrial fibrillation, cerebrovascular accident, hypertension, lower extremity deep venous thrombosis, and hyperlipidemia.  She recently presented with postprandial abdominal pain and weight loss and was found to have gallstones.  She was initially seen by our group and thought to require cholecystectomy.  In the interim, she was admitted to the hospital for further progression of symptoms and further weight loss and diffuse vascular disease was diagnosed.  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  She was evaluated by Dr. Darrick Penna who felt that she would need an embolectomy and possible aorto-superior mesenteric artery bypass.  Because of the overlap of her gallbladder symptoms and the presence of gallstones, we offered cholecystectomy to the patient.  Both she and her family wanted to have this done assuming that the vascular procedure went well.  Dr. Darrick Penna and I discussed this preoperatively and decided this was a reasonable course of action.  OPERATIVE TECHNIQUE:  The patient was brought to the operating room and underwent general  anesthesia.  Dr. Darrick Penna performed exploratory laparotomy, and ultimately an aorto-superior mesenteric artery bypass with a synthetic graft.  That went well.  After he closed the retroperitoneum, he then called me to the operating room for the cholecystectomy.  Dr. Darrick Penna assisted me throughout the cholecystectomy.  Exposure was good with self-retaining retractors.  The liver had a normal color, but it was a little bit lumpy.  There were no liver nodules.  The gallbladder was distended and tense, but thin walled. There was no acute inflammation.  I could see the cystic duct and common bile duct fairly easily.  Exposure was created.  The gallbladder, infundibulum, and fundus were grasped with Kelly clamps.  The gallbladder was dissected from its gallbladder bed from the fundus downward.  We did make one small hole in the gallbladder and some bile was spilled and quickly evacuated.  We further dissected the gallbladder from the fundus downward until we had completely dissected it free.  We isolated the small cystic artery, controlled it with multiple metal clips, and divided it.  We then isolated the cystic duct and could clearly see the single cystic duct completely separate from the common bile duct.  The cystic duct was then secured with three metal clips and divided and the gallbladder removed.  The subphrenic space and subhepatic space and the right paracolic gutter were then extensively irrigated with about 2 liters of saline.  The bed of the gallbladder  was cauterized and was completely hemostatic at the completion of the case. I chose to leave a drain because of the risk of bile leak and contamination of the aortic graft.  A #19-French Blake drain was placed in the subhepatic space down to the foramen of Winslow and brought out through separate stab incision in the right subcostal area, sutured to the skin with nylon suture and connected to a suction bulb.  Further inspection  revealed no bleeding from the gallbladder bed and no evidence of any bile leak, whatsoever.  The estimated blood loss for the cholecystectomy was probably 10-20 mL. There were no intraoperative complications for this for the procedure.  At this point of the case, the control of the operative procedure was returned to Dr. Darrick Penna who was going to close the abdominal wall.     Angelia Mould. [name], M.D.     HMI/MEDQ  D:  06/13/2011  T:  06/14/2011  Job:  [id number]  cc:   Janetta Hora. Darrick Penna, MD  Electronically Signed by Claud Kelp M.D. on 06/14/2011 02:32:41 PM

## 2011-06-15 ENCOUNTER — Inpatient Hospital Stay (HOSPITAL_COMMUNITY): Payer: Medicare Other

## 2011-06-15 LAB — BASIC METABOLIC PANEL
CO2: 22 mEq/L (ref 19–32)
Calcium: 7.4 mg/dL — ABNORMAL LOW (ref 8.4–10.5)
Creatinine, Ser: 0.97 mg/dL (ref 0.50–1.10)
GFR calc non Af Amer: 55 mL/min — ABNORMAL LOW (ref >60)
Glucose, Bld: 134 mg/dL — ABNORMAL HIGH (ref 70–99)
Sodium: 138 mEq/L (ref 135–145)

## 2011-06-15 LAB — URINE MICROSCOPIC-ADD ON

## 2011-06-15 LAB — PHOSPHORUS: Phosphorus: 4.1 mg/dL (ref 2.3–4.6)

## 2011-06-15 LAB — CBC
Hemoglobin: 10.2 g/dL — ABNORMAL LOW (ref 12.0–15.0)
MCH: 27.9 pg (ref 26.0–34.0)
MCHC: 35.1 g/dL (ref 30.0–36.0)
MCV: 79.7 fL (ref 78.0–100.0)
Platelets: 172 10*3/uL (ref 150–400)

## 2011-06-15 LAB — URINALYSIS, ROUTINE W REFLEX MICROSCOPIC
Bilirubin Urine: NEGATIVE
Glucose, UA: NEGATIVE mg/dL
Hgb urine dipstick: NEGATIVE
Specific Gravity, Urine: 1.018 (ref 1.005–1.030)
Urobilinogen, UA: 0.2 mg/dL (ref 0.0–1.0)

## 2011-06-16 ENCOUNTER — Inpatient Hospital Stay (HOSPITAL_COMMUNITY): Payer: Medicare Other

## 2011-06-16 LAB — CBC
Hemoglobin: 10.8 g/dL — ABNORMAL LOW (ref 12.0–15.0)
MCH: 28.3 pg (ref 26.0–34.0)
MCHC: 35.5 g/dL (ref 30.0–36.0)
MCV: 79.6 fL (ref 78.0–100.0)
RBC: 3.82 MIL/uL — ABNORMAL LOW (ref 3.87–5.11)

## 2011-06-16 LAB — COMPREHENSIVE METABOLIC PANEL
CO2: 22 mEq/L (ref 19–32)
Calcium: 7.6 mg/dL — ABNORMAL LOW (ref 8.4–10.5)
Creatinine, Ser: 0.9 mg/dL (ref 0.50–1.10)
GFR calc Af Amer: >60 mL/min (ref >60)
GFR calc non Af Amer: 60 mL/min — ABNORMAL LOW (ref >60)
Glucose, Bld: 154 mg/dL — ABNORMAL HIGH (ref 70–99)
Sodium: 138 mEq/L (ref 135–145)
Total Protein: 4.7 g/dL — ABNORMAL LOW (ref 6.0–8.3)

## 2011-06-16 LAB — AMYLASE: Amylase: 107 U/L — ABNORMAL HIGH (ref 0–105)

## 2011-06-16 LAB — MAGNESIUM: Magnesium: 2.2 mg/dL (ref 1.5–2.5)

## 2011-06-16 LAB — PHOSPHORUS: Phosphorus: 3.1 mg/dL (ref 2.3–4.6)

## 2011-06-16 LAB — LIPASE, BLOOD: Lipase: 8 U/L — ABNORMAL LOW (ref 11–59)

## 2011-06-16 NOTE — Consult Note (Signed)
NAMEJANEAL, [name] NO.:  [id number]  MEDICAL RECORD NO.:  [id number]  LOCATION:  5155                         FACILITY:  [facility]  PHYSICIAN:  Fransisco Hertz, MD       DATE OF BIRTH:  1925-05-31  DATE OF CONSULTATION:  06/09/2011 DATE OF DISCHARGE:                                CONSULTATION   Requested by Dr. Sherrie George with General Surgery.  REASON FOR CONSULTATION:  Possible chronic mesenteric ischemia.  HISTORY OF PRESENT ILLNESS:  This is an 77 year old patient who presents with chief complaint of abdominal pain.  She was admitted to the General Surgery Service due to cholelithiasis.  This patient's pain has been ongoing for at least 2-3 months.  She is not exactly certain of chronology.  Her recollection is somewhat limited.  She does note that certain foods such as fatty foods trigger pain, but she notes that it is not consistent postprandial pain.  She does note also nausea, vomiting, some diarrhea on this admission.  She thinks she has lost about 22 pounds over a week's time period, but apparently she had told to general surgeon over this was actually over a 2-3 months time.  Denies currently any fevers or chills.  She also notes that she has multiple medical problems, her atherosclerotic risk factors include hyperlipidemia and hypertension.  She has previously had a MRI and stroke.  She denies any history of intermittent claudication or tissue loss in her feet.  PAST MEDICAL HISTORY:  Includes coronary artery disease, status post an MI, hypertension, hypercholesterolemia, atrial fibrillation and stroke, history of lower extremity deep vein thrombosis and cholelithiasis.  PAST SURGICAL HISTORY:  Includes a total abdominal hysterectomy.  FAMILY HISTORY:  Unknown.  She does not remember exactly the medical problems her mother and father had.  SOCIAL HISTORY:  She denies any cigarette use.  Used to use snuff, but at this point, has not been  using it for years.  She is not exactly certain of the chronology for that.  Denies any alcohol or illicit drug use.  MEDICATION:  She is on multivitamin, iron, potassium, Cartia, Lasix, omeprazole, Actonel, Phenergan, lisinopril, Lipitor, Co-Q.  ALLERGIES:  MORPHINE.  REVIEW OF SYSTEMS:  Significant for the weight loss as noted above, otherwise the rest of her review of systems was as above, otherwise negative.  PHYSICAL EXAMINATION:  VITAL SIGNS:  Current temperature of 100.1 degrees Fahrenheit, blood pressure 122/91, heart rate of 84, respirations were 18, saturating 94% on room air. GENERAL:  She appears elderly, alert and oriented x3, in no apparent distress. HEAD:  Normocephalic, atraumatic.  There is obvious temporalis wasting. ENT:  Hearing is grossly intact.  Oropharynx without any erythema or exudate.  Nares without any drainage or erythema. NECK:  Supple neck.  No nuchal rigidity.  No obvious lymphadenopathy palpable. EYE:  She has arcus senilis bilaterally.  Otherwise, pupils were equal, round, and reactive to light.  Extraocular movements were intact. PULMONARY:  Symmetric expansion, good air movement.  No rales, rhonchi or wheezing. CARDIAC:  Regular rate and rhythm.  Normal S1 and S2.  No murmurs, rubs, thrills or gallops. VASCULAR:  Palpable radial, brachial pulses, palpable carotids  with no bruits.  Abdominal aorta was not prominent in the midline and abdomen. Palpable femorals.  No palpable popliteal or pedal pulses. ABDOMEN:  Soft abdomen, mild tenderness in the epigastric area and right upper quadrant.  There is no guarding, no rebound and no obvious hepatosplenomegaly.  I could not appreciate any masses.  No costovertebral angle tenderness. MUSCULOSKELETAL:  She had 5/5 strength in all extremities, she is able to ambulate without assistance.  There are no signs of any an ischemia in either feet or any frank gangrene or ulcers. NEURO:  Cranial nerves II-XII  were grossly intact.  Motor exam was as above.  Sensation is grossly intact in all extremities. PSYCH:  Judgment was intact.  Mood and affect were appropriate for clinical situation. SKIN:  I did not note any rashes on extremities were as listed above. LYMPHATICS:  No cervical, axillary, or inguinal lymphadenopathy.  I reviewed labs on this patient.  She had chemistries, sodium 135, potassium 3.7, chloride 102, bicarb 25, glucose is 107, BUN 5, creatinine 0.84.  CBC, white count was 9.1, H and H are 11.8 and 33.7, platelet count 193.  Previously, amylase and lipase were done which were both completely normal.  She had also a comprehensive metabolic panel in which the LFTs were normal except for an albumin up to 3.1.  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  There is extensive amount of atherosclerotic calcified disease within this patient's aorta.  There is a patent IMA.  I do not see an extensive stenosis as read by the radiologist.  There is evidence of perfusion to the distal branches of the celiac access and also to the SMA.  Additionally, the right common iliac artery has a significant stenosis near its bifurcation of the aorta.  MEDICAL DECISION-MAKING:  This is an 77 year old patient with multiple comorbidities, previously has had an MI and stroke before, that presents with likely thrombotic, chronic mesenteric ischemia.  She has no evidence currently of acute ischemia, so no emergent intervention needs to be immediately taken.  However, this patient needs to have the gold- standard diagnostic test completed. an aortogram with possible selective mesenteric angiogram, additionally, in the process of doing this the right common iliac artery will likely be crossed.  I would  check a gradient across this common iliac artery to see if it needs possible  angioplasty or stenting.  I had a long discussion with this  patient in regards  to risks of this procedure include, but are not limited to bleeding, infection,  possible embolization, possible rupture of intervene vessels, possible need for additional procedures.  She is aware of these risks and agreed to proceed forward.  I will arrange for this possible selective mesenteric angiogram and possible iliac intervention for tomorrow to be performed by Dr. Darrick Penna.  In this patient given her previous history of MI she also will need cardiac evaluation for risk stratification, possible preoperative optimization.  I suspect that she will not tolerate an open revascularization procedure well given her advanced age and multiple comorbidities.     Fransisco Hertz, MD     BLC/MEDQ  D:  06/09/2011  T:  06/10/2011  Job:  [id number]  Electronically Signed by Leonides Sake MD on 06/16/2011 10:40:19 AM

## 2011-06-17 ENCOUNTER — Inpatient Hospital Stay (HOSPITAL_COMMUNITY): Payer: Medicare Other

## 2011-06-17 DIAGNOSIS — R5381 Other malaise: Secondary | ICD-10-CM

## 2011-06-17 DIAGNOSIS — J9600 Acute respiratory failure, unspecified whether with hypoxia or hypercapnia: Secondary | ICD-10-CM

## 2011-06-17 LAB — MAGNESIUM: Magnesium: 2.1 mg/dL (ref 1.5–2.5)

## 2011-06-17 LAB — CROSSMATCH
ABO/RH(D): A POS
Unit division: 0

## 2011-06-17 LAB — BASIC METABOLIC PANEL
CO2: 22 mEq/L (ref 19–32)
Calcium: 7.5 mg/dL — ABNORMAL LOW (ref 8.4–10.5)
Creatinine, Ser: 0.95 mg/dL (ref 0.50–1.10)
GFR calc Af Amer: >60 mL/min (ref >60)
GFR calc non Af Amer: 56 mL/min — ABNORMAL LOW (ref >60)
Sodium: 142 mEq/L (ref 135–145)

## 2011-06-17 LAB — GLUCOSE, CAPILLARY

## 2011-06-17 LAB — CBC
MCH: 28 pg (ref 26.0–34.0)
MCHC: 35.2 g/dL (ref 30.0–36.0)
MCV: 79.5 fL (ref 78.0–100.0)
Platelets: 217 10*3/uL (ref 150–400)
RDW: 17.2 % — ABNORMAL HIGH (ref 11.5–15.5)

## 2011-06-18 LAB — GLUCOSE, CAPILLARY
Glucose-Capillary: 124 mg/dL — ABNORMAL HIGH (ref 70–99)
Glucose-Capillary: 121 mg/dL — ABNORMAL HIGH (ref 70–99)
Glucose-Capillary: 138 mg/dL — ABNORMAL HIGH (ref 70–99)

## 2011-06-18 LAB — BASIC METABOLIC PANEL
Chloride: 114 mEq/L — ABNORMAL HIGH (ref 96–112)
GFR calc Af Amer: 59 mL/min — ABNORMAL LOW (ref >60)
GFR calc non Af Amer: 49 mL/min — ABNORMAL LOW (ref >60)
Glucose, Bld: 136 mg/dL — ABNORMAL HIGH (ref 70–99)
Potassium: 3.9 mEq/L (ref 3.5–5.1)
Sodium: 141 mEq/L (ref 135–145)

## 2011-06-18 LAB — CBC
Hemoglobin: 10.4 g/dL — ABNORMAL LOW (ref 12.0–15.0)
MCHC: 34.9 g/dL (ref 30.0–36.0)
RDW: 18 % — ABNORMAL HIGH (ref 11.5–15.5)
WBC: 12.6 10*3/uL — ABNORMAL HIGH (ref 4.0–10.5)

## 2011-06-18 LAB — PHOSPHORUS: Phosphorus: 3 mg/dL (ref 2.3–4.6)

## 2011-06-19 LAB — GLUCOSE, CAPILLARY
Glucose-Capillary: 85 mg/dL (ref 70–99)
Glucose-Capillary: 121 mg/dL — ABNORMAL HIGH (ref 70–99)

## 2011-06-20 LAB — BASIC METABOLIC PANEL
BUN: 12 mg/dL (ref 6–23)
Calcium: 7.4 mg/dL — ABNORMAL LOW (ref 8.4–10.5)
Creatinine, Ser: 1.03 mg/dL (ref 0.50–1.10)
GFR calc non Af Amer: 51 mL/min — ABNORMAL LOW (ref >60)
Glucose, Bld: 108 mg/dL — ABNORMAL HIGH (ref 70–99)
Sodium: 144 mEq/L (ref 135–145)

## 2011-06-20 LAB — CBC
HCT: 30.2 % — ABNORMAL LOW (ref 36.0–46.0)
Hemoglobin: 10.5 g/dL — ABNORMAL LOW (ref 12.0–15.0)
MCH: 28.1 pg (ref 26.0–34.0)
MCHC: 34.8 g/dL (ref 30.0–36.0)
MCV: 80.7 fL (ref 78.0–100.0)

## 2011-06-20 LAB — GLUCOSE, CAPILLARY
Glucose-Capillary: 82 mg/dL (ref 70–99)
Glucose-Capillary: 114 mg/dL — ABNORMAL HIGH (ref 70–99)

## 2011-06-20 NOTE — Op Note (Signed)
NAMEROCKY, RISHEL             ACCOUNT NO.:  [id number]  MEDICAL RECORD NO.:  [id number]  LOCATION:                                 FACILITY:  PHYSICIAN:  Loni Abdon E. Zaydenn Balaguer, MD  DATE OF BIRTH:  May 19, 1925  DATE OF PROCEDURE:  06/13/2011 DATE OF DISCHARGE:                              OPERATIVE REPORT   PROCEDURE: 1. Thrombectomy of superior mesenteric artery. 2. Bovine pericardial patch, superior mesenteric artery. 3. Aorta to superior mesenteric artery bypass with 6-mm Dacron.  PREOPERATIVE DIAGNOSIS:  Chronic mesenteric ischemia.  POSTOPERATIVE DIAGNOSIS:  Chronic mesenteric ischemia.  ANESTHESIA:  General.  ASSISTANTS:  Fransisco Hertz, MD, Cari Caraway, MD, Della Goo, PA-C  INDICATIONS:  The patient is a 77 year old female with approximately 6- week history of poor tolerance of food and 22-pound weight loss.  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 She also has a known history of atrial fibrillation.  It is currently unknown whether this is embolic phenomena or chronic occlusion.  OPERATIVE FINDINGS: 1. A 6-mm Dacron graft from infrarenal aorta to superior mesenteric     artery. 2. High-grade stenosis of superior mesenteric artery, probably at the     aortic origin with atherosclerotic plaque. 3. Severely calcified abdominal aorta. 4. Well-perfused bowel at conclusion of case.  OPERATIVE DETAIL:  After obtaining informed consent, the patient was taken to the operating room.  The patient was placed in supine position on operating table.  After induction of general anesthesia and endotracheal intubation, nasogastric tube was placed, Foley catheter was placed.  The patient's entire abdomen was prepped and draped in usual sterile fashion.  A midline laparotomy incision was then performed extending from the xiphoid to pubis.  Incision was carried down  through the subcutaneous tissues through the fascia and the peritoneum was incised with full lengthy incision.  Omni retractor was then brought up in the operative field for assistance in retraction.  The small bowel was reflected to the right and packed away.  The bowel had fairly normal appearance, although there were large number of collaterals in the retroperitoneum.  Retroperitoneum was entered.  The infrarenal abdominal aorta was heavily calcified as well as the iliac arteries.  The inferior mesenteric artery was identified and protected.  Dissection was carried up to the level of the ligament of Treitz and this was taken down with cautery dissection and the superior mesenteric artery was identified and dissected free circumferentially.  It was fairly soft on its external surface and not really calcified but certainly thickened and seemed to be full of thrombus.  Next the infrarenal abdominal aorta was dissected free on its anterior two third surface.  The renal vein was identified and protected and reflected superiorly.  The left and right renal arteries were identified.  A space was cleared just below the renal arteries that would be suitable for clamping of an aortic bypass was necessary.  There was a soft area approximately 3-4 cm in length extending from just below the renal arteries down to a very heavily calcified aorta below this. There was not enough room to apply a side-biting  clamp and it was determined that the patient would need to have the aorta fully clamped if an aortic bypass was necessary.  At this point, the patient was given a bolus of 7000 units of heparin.  It should be noted the patient was given an additional 7000 units of heparin during the course of the case. After appropriate circulation time, the superior mesenteric artery was clamped proximally by controlling it with a vessel loop proximally and distally.  Approximately 4 cm of the superior mesenteric  artery was exposed near its origin at the aorta down to just at the level of the duodenum.  A longitudinal opening was made in the superior mesenteric artery and there was fresh thrombus found within this.  A #4 Fogarty catheter was used to thrombectomize the SMA distally and there was good backbleeding that was almost pulsatile in character.  This was then reconfirmed with vessel loop.  Attempt was then made to pass a Fogarty catheter proximally.  Catheter would not pass initially.  At this point, I proceeded to pass several coronary dilators through the SMA.  I was able to dilate the SMA track up to a 5-mm dilator with fairly brisk inflow.  Some atherosclerotic plaque was also removed.  There appeared to be a narrowing right at the aorta SMA junction on passing the dilators.  I was then able to pass Fogarty catheter up into the abdominal aorta and again the inflow was fairly good at this point. This was then recontrolled proximally with a Henley clamp right at the level of the aortic origin.  A bovine pericardial patch was then brought up in the operative field and sewn on as patch angioplasty using a running 6-0 Prolene suture.  Just prior to completion of anastomosis, this was fore bled, back bled, and thoroughly flushed.  Anastomosis was secured.  Vessel loops were released.  There was flow in the SMA at this point, however, it was not pulsatile and I was not happy with the inflow.  I thought that if it is left in this shape that she would be at high risk to reocclude the SMA in the near future.  At this point, it was decided to perform an aorta to superior mesenteric artery bypass. The aorta was clamped just below the renal arteries from the aortic DeBakey clamp and just above a very heavily calcified aorta giving [place] approximately 4 cm of working room on the aorta.  This provided very good proximal control, however, the distal control there was some weeping from the distal clamp but  I did not feel that we could make this any better without injuring the distal aorta.  A longitudinal opening was made in the aorta at the soft area just below the renal arteries and the aortotomy was enlarged using a 5-mm aortic punch.  The size of the hole was of good match for a 6-mm Dacron graft.  I did not feel we had enough room on the aortic side to place an 8-mm graft and this would not be very good size match for a superior mesenteric artery as well which was approximately 4 mm artery.  I then proceeded to sew the proximal anastomosis end of graft to side of aorta using a running 5-0 Prolene suture.  The inferior portion of anastomosis was fairly difficult and tedious to the heavy calcification of the aorta.  At completion anastomosis, this was tested and there was found to be one area of leak along the right side of  the anastomosis.  This was repaired with a single 5-0 pledgeted suture.  Proximal anastomosis was then hemostatic at this point and the clamp was removed from the aorta establishing distal flow to the lower extremities.  The patient's blood pressure briefly dropped into the 70 systolic range and this quickly recovered with fluid and pressors and we were able to wean the pressors back off. Attention was then turned to the distal anastomosis.  Due to the previous location of the bovine pericardial patch, anastomosis had to be completed on the posterior wall of the SMA so that the graft would have good lie and also avoid taking down this entire patch.  This again made the anastomosis fairly difficult due to the posterior location of the anastomosis.  The artery was opened longitudinally and the arteriotomy again opened further with a 5 mm punch to make the hole a good size, fit for the 6-mm graft.  The graft was then sewn using a running 6-0 Prolene suture.  Just prior to completion anastomosis, this was fore bled, back bled, and thoroughly flushed.  Anastomosis was  secured, clamps released. There was a leak on the posterior wall which was again repaired with a 6- 0 Prolene suture with a pledget.  Hemostasis was then obtained.  FloSeal was then applied to both the proximal and distal anastomosis and the heparin was reversed with 100 mg of protamine.  The wound was then again for the anastomosis was then inspected, again found to be hemostatic.  I then closed the retroperitoneum using a running 3-0 Vicryl suture and returned the viscera to its normal position.  At this point, Dr. [name] came in the room and performed a cholecystectomy.  There was some bile spillage from the gallbladder but this did not spill throughout the abdomen and seemed to be fairly controlled at the field of the gallbladder itself.  He thoroughly irrigated this at the end of the case as well trying to avoid any cross contamination of the graft.  The cholecystectomy is dictated as separate operative note by Dr. [name]. After he completed the cholecystectomy, I placed a drain in the right upper quadrant and irrigated the abdomen.  I then proceeded to close the abdomen using a running #1 PDS suture and the fascia.  The skin was then closed with staples.  The patient tolerated the procedure well and there were no complications. The patient was taken to the Intensive Care Unit, intubated on a ventilator in stable condition.     Janetta Hora. Ziyan Schoon, MD     CEF/MEDQ  D:  06/14/2011  T:  06/14/2011  Job:  [id number]  Electronically Signed by Fabienne Bruns MD on 06/20/2011 09:55:34 AM

## 2011-06-20 NOTE — Op Note (Signed)
NAMEJAYLON, [name]             ACCOUNT NO.:  [id number]  MEDICAL RECORD NO.:  [id number]  LOCATION:  5155                         FACILITY:  [facility]  PHYSICIAN:  Janetta Hora. Tierra Thoma, MD  DATE OF BIRTH:  Sep 22, 1925  DATE OF PROCEDURE:  06/10/2011 DATE OF DISCHARGE:                              OPERATIVE REPORT   PROCEDURE: 1. Abdominal aortogram with lower extremity runoff. 2. Mesenteric angiogram.  PREOPERATIVE DIAGNOSIS:  Mesenteric ischemia.  POSTOPERATIVE DIAGNOSIS:  Mesenteric ischemia.  ANESTHESIA:  Local.  OPERATIVE DETAILS:  After obtaining informed consent, the patient taken to PV Lab.  The patient placed in supine position on the angio table. Both groins were prepped and draped in the usual sterile fashion.  Local anesthesia was infiltrated over the left common femoral artery.  An introducer needle was used to cannulate the left common femoral artery and a 0.035 Versacore wire threaded into the abdominal aorta under fluoroscopic guidance.  Next, a 6-French sheath was placed over the guidewire into the left common femoral artery.  This was thoroughly flushed with heparinized saline.  A 5-French pigtail catheter was then placed over the guidewire into the abdominal aorta and abdominal aortogram obtained in an AP projection.  This shows bilateral single renal arteries which are patent.  The inferior mesenteric artery is visualized and has a high-grade greater than 90% stenosis.  There is underfilling of the celiac artery as well as the mesenteric arcades. The mesentery supplies primarily from the inferior mesenteric artery. No obvious flow was seen coming down the main trunk of the superior mesenteric or celiac arteries.  A lateral and oblique projection of the abdominal aorta was also performed.  On the lateral projection, there is no visualization of the celiac or superior mesenteric artery.  There is a small area which appears to be a stump of the superior  mesenteric artery, but no flow distally into this.  Even on delay, there is no significant contrast opacification of the distal superior mesenteric artery or celiac artery. The oblique view also shows no significant filling of these mesenteric vessels.  Next, the pigtail catheter was pulled down just above the aortic bifurcation.  There is a 50% stenosis of the right common iliac artery. Lower extremity runoff views were then performed.  The left and right external and internal iliac is patent.  The right external and internal iliac artery is patent.  In the left lower extremity, the left common femoral artery is patent. The left profunda femoris artery is patent.  The left superficial femoral artery is diffusely diseased and calcified with several subtotal occlusion in its mid segment.  The left popliteal artery is patent.  All three tibial vessels are occluded.  There is diminutive flow via a very diseased posterior tibial artery to the left foot.  In the right lower extremity, the right common femoral artery is patent. The right profunda femoris artery is patent.  The right superficial femoral artery is similar to the left with diffuse calcific disease and areas of the subtotal occlusion in its mid segment.  The right popliteal artery is patent.  However, all 3 tibial vessels in the right leg are occluded and there is no named runoff  vessel to the right foot.  Next, pigtail catheter was removed over a guidewire.  The sheath was left in place to be pulled in the holding area.  The patient tolerated the procedure well and there were no complications.  OPERATIVE FINDINGS: 1. 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  Based on the     CT angio findings, I would suggest that this is most likely embolic     in nature, although with calcification of her vessels, it could be     a combination of thrombotic and embolic material.  Her inferior     mesenteric artery had a 90%  stenosis.  The patient will be     scheduled for exploratory laparotomy, potential embolectomy, or     bypass of her superior mesenteric artery early next week.  She will     be placed on a heparin drip later today. 2. Right common iliac 50% stenosis. 3. 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     Janetta Hora. Julitza Rickles, MD     CEF/MEDQ  D:  06/10/2011  T:  06/11/2011  Job:  [id number]  Electronically Signed by Fabienne Bruns MD on 06/20/2011 09:55:24 AM

## 2011-06-21 LAB — BASIC METABOLIC PANEL
Calcium: 7.2 mg/dL — ABNORMAL LOW (ref 8.4–10.5)
GFR calc Af Amer: >60 mL/min (ref >60)
GFR calc non Af Amer: >60 mL/min (ref >60)
Potassium: 3.9 mEq/L (ref 3.5–5.1)
Sodium: 142 mEq/L (ref 135–145)

## 2011-06-21 LAB — GLUCOSE, CAPILLARY
Glucose-Capillary: 107 mg/dL — ABNORMAL HIGH (ref 70–99)
Glucose-Capillary: 88 mg/dL (ref 70–99)
Glucose-Capillary: 90 mg/dL (ref 70–99)

## 2011-06-22 LAB — CBC
HCT: 24.8 % — ABNORMAL LOW (ref 36.0–46.0)
MCV: 81.8 fL (ref 78.0–100.0)
Platelets: 208 10*3/uL (ref 150–400)
RBC: 3.03 MIL/uL — ABNORMAL LOW (ref 3.87–5.11)
WBC: 18.3 10*3/uL — ABNORMAL HIGH (ref 4.0–10.5)

## 2011-06-22 LAB — BASIC METABOLIC PANEL
BUN: 8 mg/dL (ref 6–23)
CO2: 20 mEq/L (ref 19–32)
Chloride: 115 mEq/L — ABNORMAL HIGH (ref 96–112)
Creatinine, Ser: 0.96 mg/dL (ref 0.50–1.10)
GFR calc Af Amer: >60 mL/min (ref >60)
Potassium: 3.5 mEq/L (ref 3.5–5.1)

## 2011-06-22 LAB — GLUCOSE, CAPILLARY
Glucose-Capillary: 88 mg/dL (ref 70–99)
Glucose-Capillary: 100 mg/dL — ABNORMAL HIGH (ref 70–99)
Glucose-Capillary: 105 mg/dL — ABNORMAL HIGH (ref 70–99)

## 2011-06-23 LAB — CBC
HCT: 23.5 % — ABNORMAL LOW (ref 36.0–46.0)
Hemoglobin: 8.2 g/dL — ABNORMAL LOW (ref 12.0–15.0)
MCH: 28.4 pg (ref 26.0–34.0)
MCHC: 34.9 g/dL (ref 30.0–36.0)
RDW: 18.8 % — ABNORMAL HIGH (ref 11.5–15.5)

## 2011-06-23 LAB — GLUCOSE, CAPILLARY
Glucose-Capillary: 121 mg/dL — ABNORMAL HIGH (ref 70–99)
Glucose-Capillary: 108 mg/dL — ABNORMAL HIGH (ref 70–99)

## 2011-06-23 LAB — BASIC METABOLIC PANEL
BUN: 6 mg/dL (ref 6–23)
Calcium: 7.4 mg/dL — ABNORMAL LOW (ref 8.4–10.5)
Creatinine, Ser: 0.87 mg/dL (ref 0.50–1.10)
GFR calc Af Amer: >60 mL/min (ref >60)
GFR calc non Af Amer: >60 mL/min (ref >60)
Glucose, Bld: 88 mg/dL (ref 70–99)
Potassium: 3.6 mEq/L (ref 3.5–5.1)

## 2011-06-24 LAB — CBC
MCH: 27.9 pg (ref 26.0–34.0)
Platelets: 262 10*3/uL (ref 150–400)
RBC: 2.83 MIL/uL — ABNORMAL LOW (ref 3.87–5.11)
RDW: 18.5 % — ABNORMAL HIGH (ref 11.5–15.5)
WBC: 14.6 10*3/uL — ABNORMAL HIGH (ref 4.0–10.5)

## 2011-06-25 LAB — GLUCOSE, CAPILLARY: Glucose-Capillary: 101 mg/dL — ABNORMAL HIGH (ref 70–99)

## 2011-06-25 LAB — CROSSMATCH
ABO/RH(D): A POS
Unit division: 0

## 2011-06-25 LAB — CBC
HCT: 30.9 % — ABNORMAL LOW (ref 36.0–46.0)
Hemoglobin: 11 g/dL — ABNORMAL LOW (ref 12.0–15.0)
MCH: 29.1 pg (ref 26.0–34.0)
MCHC: 35.6 g/dL (ref 30.0–36.0)
MCV: 81.7 fL (ref 78.0–100.0)

## 2011-06-25 LAB — BASIC METABOLIC PANEL
BUN: 6 mg/dL (ref 6–23)
CO2: 23 mEq/L (ref 19–32)
Calcium: 7.6 mg/dL — ABNORMAL LOW (ref 8.4–10.5)
Creatinine, Ser: 0.96 mg/dL (ref 0.50–1.10)
GFR calc non Af Amer: 55 mL/min — ABNORMAL LOW (ref >60)
Glucose, Bld: 96 mg/dL (ref 70–99)
Sodium: 136 mEq/L (ref 135–145)

## 2011-06-26 LAB — CBC
Hemoglobin: 12.4 g/dL (ref 12.0–15.0)
MCH: 29.3 pg (ref 26.0–34.0)
MCH: 29.4 pg (ref 26.0–34.0)
MCHC: 35.4 g/dL (ref 30.0–36.0)
MCHC: 35.2 g/dL (ref 30.0–36.0)
MCV: 83.4 fL (ref 78.0–100.0)
RBC: 4.22 MIL/uL (ref 3.87–5.11)
RDW: 17.3 % — ABNORMAL HIGH (ref 11.5–15.5)

## 2011-06-26 LAB — GLUCOSE, CAPILLARY
Glucose-Capillary: 98 mg/dL (ref 70–99)
Glucose-Capillary: 99 mg/dL (ref 70–99)
Glucose-Capillary: 93 mg/dL (ref 70–99)
Glucose-Capillary: 96 mg/dL (ref 70–99)

## 2011-06-26 LAB — BASIC METABOLIC PANEL
BUN: 6 mg/dL (ref 6–23)
Chloride: 106 mEq/L (ref 96–112)
Creatinine, Ser: 1.02 mg/dL (ref 0.50–1.10)
GFR calc Af Amer: >60 mL/min (ref >60)
GFR calc non Af Amer: 52 mL/min — ABNORMAL LOW (ref >60)
Glucose, Bld: 98 mg/dL (ref 70–99)
Potassium: 4 mEq/L (ref 3.5–5.1)

## 2011-06-27 LAB — CBC
HCT: 30.5 % — ABNORMAL LOW (ref 36.0–46.0)
Hemoglobin: 10.8 g/dL — ABNORMAL LOW (ref 12.0–15.0)
MCH: 29.4 pg (ref 26.0–34.0)
MCHC: 35.4 g/dL (ref 30.0–36.0)
MCV: 83.1 fL (ref 78.0–100.0)
RDW: 17.3 % — ABNORMAL HIGH (ref 11.5–15.5)

## 2011-06-27 LAB — BASIC METABOLIC PANEL
CO2: 24 mEq/L (ref 19–32)
Calcium: 7.7 mg/dL — ABNORMAL LOW (ref 8.4–10.5)
Creatinine, Ser: 0.86 mg/dL (ref 0.50–1.10)
Glucose, Bld: 84 mg/dL (ref 70–99)

## 2011-06-27 LAB — GLUCOSE, CAPILLARY: Glucose-Capillary: 98 mg/dL (ref 70–99)

## 2011-07-07 ENCOUNTER — Encounter: Payer: Self-pay | Admitting: Vascular Surgery

## 2011-07-07 ENCOUNTER — Ambulatory Visit (INDEPENDENT_AMBULATORY_CARE_PROVIDER_SITE_OTHER): Payer: Medicare Other | Admitting: Vascular Surgery

## 2011-07-07 VITALS — BP 116/79 | HR 73 | Resp 20 | Ht 64.0 in | Wt 156.0 lb

## 2011-07-07 DIAGNOSIS — I739 Peripheral vascular disease, unspecified: Secondary | ICD-10-CM | POA: Insufficient documentation

## 2011-07-07 DIAGNOSIS — K551 Chronic vascular disorders of intestine: Secondary | ICD-10-CM

## 2011-07-07 NOTE — Progress Notes (Signed)
F/U THROMBECTOMY OF SUPERIOR MESENTERIC ARTERY

## 2011-07-07 NOTE — Progress Notes (Signed)
Subjective:     Patient ID: [name], female   DOB: 04-22-1925, 77 y.o.   MRN: [id number]  HPI patient is an 77 year old female who recently underwent thrombectomy of her superior mesenteric artery with superior mesenteric artery bypass. She returns today for postoperative followup. She denies any postprandial abdominal pain. She is eating well. She has gained some weight. She is having normal bowel movements with no nausea or vomiting.   Review of Systems     Objective:   Physical Exam Blood pressure 116/79, pulse 73, resp. rate 20, height 5\' 4"  (1.626 m), weight 156 lb (70.761 kg).  Abdomen: Soft nontender nondistended well-healed midline laparotomy incision  Extremities: 1-2+ edema bilaterally, feet pink and warm, 2+ femoral pulses     Assessment:    Doing well s/p mesenteric bypas    Plan:     Follow up 6 months mesenteric duplex

## 2011-07-21 ENCOUNTER — Ambulatory Visit: Payer: Medicaid Other | Admitting: Vascular Surgery

## 2011-07-29 ENCOUNTER — Telehealth: Payer: Self-pay

## 2011-07-29 NOTE — Telephone Encounter (Signed)
Rec'd call from pt's daughter-in-law, Myra.  States pt. has had bilat feet swelling and c/o pain alternating w/ numbness of feet. Was seen at podiatrist yesterday, 8/23, and right small toe was noted to be a blue discoloration on bottom of toe.  Was advised per podiatrist to get appt. w/ Dr. Darrick Penna.   Informed daughter-in-law nurse will obtain order for vascular study, and will contact her about an appt.  Advised to take pt. to ER if sx's worsen/progress.  Verb. understanding.

## 2011-08-04 ENCOUNTER — Encounter: Payer: Self-pay | Admitting: Vascular Surgery

## 2011-08-04 ENCOUNTER — Other Ambulatory Visit: Payer: Self-pay

## 2011-08-04 DIAGNOSIS — I739 Peripheral vascular disease, unspecified: Secondary | ICD-10-CM

## 2011-08-04 NOTE — Discharge Summary (Signed)
[name], [name]             ACCOUNT NO.:  [id number]  MEDICAL RECORD NO.:  [id number]  LOCATION:  2012                         FACILITY:  [facility]  PHYSICIAN:  Janetta Hora. Jerremy Maione, MD  DATE OF BIRTH:  04/21/1925  DATE OF ADMISSION:  06/04/2011 DATE OF DISCHARGE:  06/27/2011                              DISCHARGE SUMMARY   ADMISSION DIAGNOSIS:  Epigastric pain with cholelithiasis.  HISTORY OF PRESENT ILLNESS:  This is an 77 year old female who for the past 6-8 weeks had progressively increasing abdominal pain with nausea, vomiting, particularly after meals.  She has lost a total of 22 pounds during this time.  She was given Prilosec per her primary care physician, but continued to have these symptoms.  She went to the emergency department on May 28, 2011, and was evaluated.  At sometime, she had cholelithiasis on ultrasound with no evidence of acute cholecystitis.  Liver function tests were normal as was lipase.  She was then seen by Dr. Janee Morn on June 01, 2011, and cholecystectomy was discussed.  She needed to get cardiac clearance with Dr. Algie Coffer  which apparently she will have according to her family.  However, her symptoms were progressively worsening.  She cannot keep anything down and none of medications were helping her nausea or pain.  HOSPITAL COURSE:  She is admitted to the hospital where she continued to have nausea after eating and pain as well as loose stools with an abdominal pain.  A Vascular Surgery consult was obtained for probable mesenteric ischemia.  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  On June 10, 2011, she went for mesenteric aortogram.  The findings were most likely embolic disease as well as mesenteric.  The best option was to do a mesenteric revascularization, but this was high risk.  The patient was placed on IV heparin and the plan was to take her to the OR the next Monday.  On June 13, 2011,  she was taken to the operating room where she underwent an open cholecystectomy by Dr. [name] as well as thrombectomy of the superior mesenteric artery and bovine pericardial patch to the superior mesenteric artery and aortas to superior mesenteric artery bypass with 6- mm Dacron graft by Dr. Darrick Penna.  She tolerated the procedure well, and was transported to the recovery room in satisfactory condition.  She did fairly well overnight.  On postoperative day #1, her abdomen was soft with no distention.  She had no palpable brachial or radial pulse preoperatively and probably had significant subclavian artery disease. Dr. Darrick Penna is willing to accept the systolic blood pressure greater than 100 in light of this.  She also has significant lower extremity occlusive disease with no patent vessels below the popliteal and no revascularization options.  Plan is doing dopamine and continue antibiotics.  On postoperative day #2, the patient was extubated.  Her JP drain was left in.  She was still requiring dopamine by postoperative day #2.  She did have acute surgical blood loss anemia and was transfused packed red blood cells.  She did have leukocytosis postoperatively, but was on Solu-Cortef.  By postoperative day #4, her diet was able  to be advanced to clear liquids.  Her white blood cell count did continue to improve.  Physical Therapy consult was obtained. She did have some atrial fibrillation.  Her diet was advanced.  On postoperative day #8, the patient was eating well.  Her JP drain was removed.  The patient did have some V-tach, Dr. Algie Coffer was following. By June 27, 2011, her acute surgical anemia was stable.  Her occult blood was negative.  She was doing quite well and was discharged home that day.  DISCHARGE INSTRUCTIONS:  She is discharged home with extensive instructions on wound care and progressive ambulation.  She is instructed not to drive or perform any heavy lifting until returning  to see Dr. Darrick Penna in his office.  DISCHARGE DIAGNOSES: 1. Chronic mesenteric ischemia.     a.     Status post thrombectomy of superior mesenteric artery on      June 13, 2011.     b.     Bovine pericardial patch to superior mesenteric artery on      June 13, 2011.     c.     Aorta to superior mesenteric artery bypass on June 13, 2011. 2. Chronic cholecystitis with cholelithiasis.     a.     Status post cholecystectomy on June 13, 2011. 3. Coronary artery disease. 4. Myocardial infarction. 5. Atrial fibrillation. 6. Cerebrovascular accident. 7. Hypertension. 8. Arthritis. 9. Lower extremity deep vein thrombosis. 10.Hyperlipidemia.  DISCHARGE MEDICATIONS: 1. Zofran 8 mg p.o. q.6 hours p.r.n. 2. Promethazine 25 mg half-tablet b.i.d. p.r.n. 3. CoQ10 one daily. 4. Multivitamin p.o. daily. 5. Iron 65 mg p.o. daily. 6. Omeprazole 20 mg p.o. b.i.d. 7. Lisinopril 5 mg p.o. daily. 8. Actonel 35 mg weekly. 9. Lasix 20 mg p.o. daily. 10.Lipitor 10 mg p.o. daily. 11.Cartia XT 108 mg p.o. daily. 12.Potassium 20 mEq p.o. daily.  FOLLOWUP: 1. The patient is to follow up with Dr. Darrick Penna in 2 weeks. 2. The patient is to follow with Dr. Algie Coffer in 1 week.     Newton Pigg, PA   ______________________________ Janetta Hora Alexia Dinger, MD    SE/MEDQ  D:  07/18/2011  T:  07/18/2011  Job:  [id number]  Electronically Signed by Newton Pigg PA on 07/19/2011 09:56:53 AM Electronically Signed by Fabienne Bruns MD on 08/04/2011 06:26:34 PM

## 2011-08-05 ENCOUNTER — Encounter: Payer: Self-pay | Admitting: Vascular Surgery

## 2011-08-05 ENCOUNTER — Ambulatory Visit (INDEPENDENT_AMBULATORY_CARE_PROVIDER_SITE_OTHER): Payer: Medicare Other | Admitting: Vascular Surgery

## 2011-08-05 ENCOUNTER — Other Ambulatory Visit (INDEPENDENT_AMBULATORY_CARE_PROVIDER_SITE_OTHER): Payer: Medicare Other

## 2011-08-05 VITALS — BP 114/73 | HR 79 | Resp 18 | Ht 64.0 in | Wt 147.0 lb

## 2011-08-05 DIAGNOSIS — L98499 Non-pressure chronic ulcer of skin of other sites with unspecified severity: Secondary | ICD-10-CM

## 2011-08-05 DIAGNOSIS — I739 Peripheral vascular disease, unspecified: Secondary | ICD-10-CM

## 2011-08-05 MED ORDER — TRAMADOL HCL 50 MG PO TABS: 50.0000 mg | ORAL_TABLET | Freq: Four times a day (QID) | ORAL | Status: AC | PRN

## 2011-08-05 NOTE — Progress Notes (Signed)
[facility] [place]  Established Critical Limb Ischemia Patient  History of Present Illness  [name] is a 77 y.o. female who presents with chief complaint: R 5th toe discoloration.  The pt is s/p aortomesenteric bypass, for which she has fully recovered and her appetite and ability to eat has greatly improved.  Prior to that surgery, she underwent mesenteric angiogram and B leg runoff.  On that study, she had severe B tibial disease.  She notes since the aortomesenteric bypass increased rest pain in both legs (R>L).  Since last Thursday, she has noticed her R 5th toe changing from cyanotic to black.  She also notes drainage of clear fluid from that toe.  Past Medical History, Past Surgical History, Social History, Family History, Medications, Allergies, and Review of Systems are unchanged from previous evaluation on 07/07/11.  Physical Examination  Filed Vitals:   08/05/11 1645  BP: 114/73  Pulse: 79  Resp: 18    General: A&O x 3, WDWN  Eyes: PERRLA, EOMI  Pulmonary: Sym exp, good air movt, CTAB, no rales, rhonchi, & wheezing  Cardiac: RRR, Nl S1, S2, no Murmurs, rubs or gallops  Vascular: Vessel Right Left  Radial Palpable Palpable  Ulnary Palpable Palpable  Brachial Palpable Palpable  Carotid Palpable, without bruit Palpable, without bruit  Aorta Non-palpable N/A  Femoral Palpable Palpable  Popliteal Non-palpable Non-palpable  PT Non-palpable Non-palpable  DP Palpable Palpable   Gastrointestinal: soft, NTND, -G/R, - HSM, - masses, - CVAT B, inc well healed  Musculoskeletal: M/S 5/5 throughout, B feet cool to touch, R foot with dependent rubor, 5th toe gangrene without frank pus draining, no smell  Neurologic: CN 2-12 intact , Pain and light touch intact in extremities except B feet: decrease light touch to distal shin, Motor exam as listed above  Non-Invasive Vascular Imaging ABI (Date: 08/05/11)  RLE: 0.43, Peroneal and DP: mono  LLE:  0.71, PT and DP: mono  Medical Decision Making  [name] is a 77 y.o. female who presents with: wet gangrene of right foot with non-revasc. B tibial disease.   Based on the patient's vascular studies and examination, I have offered the patient: R AKA.  The patient is already on antibiotics given by her PCM.  I gave the patient Ultram for pain control.  I discussed in depth with the patient the nature of atherosclerosis, and emphasized the importance of maximal medical management including strict control of blood pressure, blood glucose, and lipid levels, obtaining regular exercise, and cessation of smoking.  The patient is aware that without maximal medical management the underlying atherosclerotic disease process will progress, limiting the benefit of any interventions.  The patient is going to discuss possible AKA with her family prior to proceeding.  Thank you for allowing [place] to participate in this patient's care.  Leonides Sake, MD [facility] [place] Office: [phone] Pager: [phone]

## 2011-08-19 ENCOUNTER — Ambulatory Visit (INDEPENDENT_AMBULATORY_CARE_PROVIDER_SITE_OTHER): Payer: Medicare Other | Admitting: Physician Assistant

## 2011-08-19 ENCOUNTER — Encounter: Payer: Self-pay | Admitting: Physician Assistant

## 2011-08-19 VITALS — BP 144/102 | HR 112 | Resp 20 | Ht 64.0 in | Wt 147.0 lb

## 2011-08-19 DIAGNOSIS — M79606 Pain in leg, unspecified: Secondary | ICD-10-CM

## 2011-08-19 DIAGNOSIS — M79609 Pain in unspecified limb: Secondary | ICD-10-CM

## 2011-08-19 NOTE — Progress Notes (Signed)
[facility] [place]  Established Critical Limb Ischemia Patient  History of Present Illness  [name] is a 77 y.o. female who presents with chief complaint of continued RLE pain.  The patient has rest pain and dry gangrene of the right 5th toe which is unchanged and has been present for several months.  She has unreconstructable bilat tibial disease.  At her last eval with Dr. Imogene Burn 08/05/11 he explained this to the pt and stated that her only option was for a right AKA.  Pt refused surgery and today continues to refuse amputation.  She presents to request additional pain medication.  She was provided with Ultram 50mg  #30 at her last eval.  Her sx are unchanged and she has not had progression of wounds nor development of new wounds.  She is otherwise without complaint.   Past Medical History, Past Surgical History, Social History, Family History, Medications, Allergies, and Review of Systems are unchanged from previous evaluation on 08/05/11  Physical Examination  Filed Vitals:   08/19/11 1602  BP: 144/102  Pulse: 112  Resp: 20    General: A&O x 3, WDWN AA female,   Eyes: PERRLA, EOMI,  Pulmonary: Normal resp effort and BS  Cardiac: RRR; no Murmurs, rubs or gallops  Vascular: Vessel Right Left  Radial Palpable Palpable  Ulnary Palpable Palpable  Brachial Palpable Palpable  Carotid Palpable, without bruit Palpable, without bruit  Aorta Non-palpable N/A  Femoral Palpable Palpable  Popliteal Non-palpable Non-palpable  PT Non-Palpable Non-Palpable  DP Palpable Palpable   Musculoskeletal:  Extremities without ischemic changes except dry gangrene of the right 5th toe; no drainage or odor present. Bilat feet cool; min edema bilat LE   Neurologic: Non focal; CN 2-12 intact   Non-Invasive Vascular Imaging ABI (Date: 08/05/11)  RLE:0.43  LLE: 0.71  Medical Decision Making  [name] is a 78 y.o. female who presents with chronic right LE  pain secondary to non reconstructable tibial disease   Based on the patient's vascular studies and examination, the pt was again offered a right AKA which she again refused.   The pt will be referred to the pain clinic for continued aid with her discomfort.   She was provided with a script for Ultram 50mg  1-2 po Q 4-6 hours prn pain #30 no refills to aid with her discomfort until she can be evaled by the pain clinic  She will keep her scheduled f/u with Dr. Darrick Penna regarding her mesenteric bypass.    Clinic MD: Imogene Burn

## 2011-08-25 LAB — BASIC METABOLIC PANEL
BUN: 19
CO2: 27
CO2: 29
Chloride: 102
Chloride: 103
GFR calc Af Amer: 39 — ABNORMAL LOW
Glucose, Bld: 103 — ABNORMAL HIGH
Glucose, Bld: 84
Potassium: 4.2
Potassium: 5.1
Sodium: 134 — ABNORMAL LOW
Sodium: 135

## 2011-08-25 LAB — URINALYSIS, ROUTINE W REFLEX MICROSCOPIC
Glucose, UA: NEGATIVE
Glucose, UA: NEGATIVE
Hgb urine dipstick: NEGATIVE
Ketones, ur: NEGATIVE
Protein, ur: NEGATIVE
Specific Gravity, Urine: 1.01
pH: 5.5
pH: 6.5

## 2011-08-25 LAB — CBC
HCT: 30.7 — ABNORMAL LOW
HCT: 33.2 — ABNORMAL LOW
Hemoglobin: 10.2 — ABNORMAL LOW
Hemoglobin: 11.1 — ABNORMAL LOW
MCHC: 33.3
MCV: 82.8
MCV: 83.4
Platelets: 257
Platelets: 292
RBC: 3.71 — ABNORMAL LOW
RDW: 14.2
RDW: 14.4
WBC: 10.1

## 2011-08-25 LAB — DIFFERENTIAL
Basophils Absolute: 0
Eosinophils Relative: 2
Lymphocytes Relative: 19
Monocytes Absolute: 0.5
Monocytes Relative: 5

## 2011-08-25 LAB — URINE MICROSCOPIC-ADD ON

## 2011-08-25 LAB — COMPREHENSIVE METABOLIC PANEL
AST: 35
Albumin: 3.6
Chloride: 105
Creatinine, Ser: 1.24 — ABNORMAL HIGH
GFR calc Af Amer: 50 — ABNORMAL LOW
Potassium: 3.4 — ABNORMAL LOW
Total Bilirubin: 0.5
Total Protein: 7.6

## 2011-08-25 LAB — CARDIAC PANEL(CRET KIN+CKTOT+MB+TROPI)
CK, MB: 2.9
Troponin I: 0.04

## 2011-08-25 LAB — LIPID PANEL
Cholesterol: 153
HDL: 55
LDL Cholesterol: 85
Total CHOL/HDL Ratio: 2.8
Triglycerides: 64
VLDL: 13

## 2011-08-25 LAB — PROTIME-INR
INR: 1.7 — ABNORMAL HIGH
INR: 2.3 — ABNORMAL HIGH
Prothrombin Time: 20.1 — ABNORMAL HIGH
Prothrombin Time: 25.9 — ABNORMAL HIGH

## 2011-08-25 LAB — POCT CARDIAC MARKERS
CKMB, poc: 2
Troponin i, poc: <0.05

## 2011-12-01 NOTE — Telephone Encounter (Signed)
Close encounter 

## 2012-01-03 ENCOUNTER — Ambulatory Visit: Payer: Medicare Other | Admitting: Physical Therapy

## 2012-01-09 ENCOUNTER — Ambulatory Visit
Admission: RE | Admit: 2012-01-09 | Discharge: 2012-01-09 | Disposition: A | Discharge status: Home / Self Care | Payer: Medicare Other | Source: Ambulatory Visit | Attending: Cardiovascular Disease | Admitting: Cardiovascular Disease

## 2012-01-09 ENCOUNTER — Other Ambulatory Visit: Payer: Self-pay | Admitting: Cardiovascular Disease

## 2012-01-09 DIAGNOSIS — R05 Cough: Secondary | ICD-10-CM

## 2012-01-09 DIAGNOSIS — R062 Wheezing: Secondary | ICD-10-CM

## 2012-01-11 ENCOUNTER — Inpatient Hospital Stay (HOSPITAL_COMMUNITY)
Admission: AD | Admit: 2012-01-11 | Discharge: 2012-01-18 | DRG: 293 | Disposition: A | Discharge status: Home Health | Payer: Medicare Other | Source: Ambulatory Visit | Attending: Cardiovascular Disease | Admitting: Cardiovascular Disease

## 2012-01-11 ENCOUNTER — Encounter (HOSPITAL_COMMUNITY): Payer: Self-pay | Admitting: General Practice

## 2012-01-11 DIAGNOSIS — I4891 Unspecified atrial fibrillation: Secondary | ICD-10-CM | POA: Diagnosis present

## 2012-01-11 DIAGNOSIS — I739 Peripheral vascular disease, unspecified: Secondary | ICD-10-CM | POA: Diagnosis present

## 2012-01-11 DIAGNOSIS — I519 Heart disease, unspecified: Secondary | ICD-10-CM | POA: Diagnosis present

## 2012-01-11 DIAGNOSIS — E7800 Pure hypercholesterolemia, unspecified: Secondary | ICD-10-CM | POA: Diagnosis present

## 2012-01-11 DIAGNOSIS — Z98811 Dental restoration status: Secondary | ICD-10-CM

## 2012-01-11 DIAGNOSIS — E876 Hypokalemia: Secondary | ICD-10-CM | POA: Diagnosis not present

## 2012-01-11 DIAGNOSIS — J40 Bronchitis, not specified as acute or chronic: Secondary | ICD-10-CM | POA: Diagnosis present

## 2012-01-11 DIAGNOSIS — Z803 Family history of malignant neoplasm of breast: Secondary | ICD-10-CM

## 2012-01-11 DIAGNOSIS — E785 Hyperlipidemia, unspecified: Secondary | ICD-10-CM | POA: Diagnosis present

## 2012-01-11 DIAGNOSIS — D638 Anemia in other chronic diseases classified elsewhere: Secondary | ICD-10-CM | POA: Diagnosis present

## 2012-01-11 DIAGNOSIS — I5021 Acute systolic (congestive) heart failure: Principal | ICD-10-CM | POA: Diagnosis present

## 2012-01-11 DIAGNOSIS — S78119A Complete traumatic amputation at level between unspecified hip and knee, initial encounter: Secondary | ICD-10-CM

## 2012-01-11 DIAGNOSIS — Z8673 Personal history of transient ischemic attack (TIA), and cerebral infarction without residual deficits: Secondary | ICD-10-CM

## 2012-01-11 DIAGNOSIS — I2510 Atherosclerotic heart disease of native coronary artery without angina pectoris: Secondary | ICD-10-CM | POA: Diagnosis present

## 2012-01-11 DIAGNOSIS — M1990 Unspecified osteoarthritis, unspecified site: Secondary | ICD-10-CM | POA: Diagnosis present

## 2012-01-11 DIAGNOSIS — Z86718 Personal history of other venous thrombosis and embolism: Secondary | ICD-10-CM

## 2012-01-11 DIAGNOSIS — I10 Essential (primary) hypertension: Secondary | ICD-10-CM | POA: Diagnosis present

## 2012-01-11 DIAGNOSIS — I509 Heart failure, unspecified: Secondary | ICD-10-CM | POA: Diagnosis present

## 2012-01-11 DIAGNOSIS — Z886 Allergy status to analgesic agent status: Secondary | ICD-10-CM

## 2012-01-11 DIAGNOSIS — D509 Iron deficiency anemia, unspecified: Secondary | ICD-10-CM | POA: Diagnosis present

## 2012-01-11 DIAGNOSIS — M129 Arthropathy, unspecified: Secondary | ICD-10-CM | POA: Diagnosis present

## 2012-01-11 DIAGNOSIS — E039 Hypothyroidism, unspecified: Secondary | ICD-10-CM | POA: Diagnosis present

## 2012-01-11 DIAGNOSIS — Z880 Allergy status to penicillin: Secondary | ICD-10-CM

## 2012-01-11 DIAGNOSIS — I252 Old myocardial infarction: Secondary | ICD-10-CM

## 2012-01-11 DIAGNOSIS — K219 Gastro-esophageal reflux disease without esophagitis: Secondary | ICD-10-CM | POA: Diagnosis present

## 2012-01-11 DIAGNOSIS — Z90710 Acquired absence of both cervix and uterus: Secondary | ICD-10-CM

## 2012-01-11 HISTORY — DX: Gastro-esophageal reflux disease without esophagitis: K21.9

## 2012-01-11 HISTORY — DX: Anemia, unspecified: D64.9

## 2012-01-11 LAB — CBC
MCH: 26.2 pg (ref 26.0–34.0)
MCHC: 34.1 g/dL (ref 30.0–36.0)
MCV: 76.9 fL — ABNORMAL LOW (ref 78.0–100.0)
Platelets: 384 10*3/uL (ref 150–400)
RBC: 3.81 MIL/uL — ABNORMAL LOW (ref 3.87–5.11)
RDW: 16.5 % — ABNORMAL HIGH (ref 11.5–15.5)

## 2012-01-11 LAB — COMPREHENSIVE METABOLIC PANEL
ALT: 25 U/L (ref 0–35)
Albumin: 2.5 g/dL — ABNORMAL LOW (ref 3.5–5.2)
Alkaline Phosphatase: 58 U/L (ref 39–117)
Calcium: 8.9 mg/dL (ref 8.4–10.5)
GFR calc Af Amer: 64 mL/min — ABNORMAL LOW (ref >90)
Potassium: 2.9 mEq/L — ABNORMAL LOW (ref 3.5–5.1)
Sodium: 139 mEq/L (ref 135–145)
Total Protein: 8.4 g/dL — ABNORMAL HIGH (ref 6.0–8.3)

## 2012-01-11 LAB — TSH: TSH: 48.783 u[IU]/mL — ABNORMAL HIGH (ref 0.350–4.500)

## 2012-01-11 LAB — PROTIME-INR: Prothrombin Time: 14.9 seconds (ref 11.6–15.2)

## 2012-01-11 LAB — CARDIAC PANEL(CRET KIN+CKTOT+MB+TROPI)
CK, MB: 2.8 ng/mL (ref 0.3–4.0)
CK, MB: 2.4 ng/mL (ref 0.3–4.0)
Troponin I: <0.3 ng/mL (ref <0.30)
Troponin I: <0.3 ng/mL (ref <0.30)

## 2012-01-11 LAB — DIFFERENTIAL
Basophils Relative: 0 % (ref 0–1)
Eosinophils Absolute: 0 10*3/uL (ref 0.0–0.7)
Eosinophils Relative: 0 % (ref 0–5)
Neutrophils Relative %: 65 % (ref 43–77)

## 2012-01-11 LAB — MAGNESIUM: Magnesium: 1.8 mg/dL (ref 1.5–2.5)

## 2012-01-11 LAB — APTT: aPTT: 33 seconds (ref 24–37)

## 2012-01-11 MED ORDER — ROSUVASTATIN CALCIUM 5 MG PO TABS
5.0000 mg | ORAL_TABLET | Freq: Every day | ORAL | Status: DC
  Administered 2012-01-11 – 2012-01-18 (×8): 5 mg via ORAL
  Filled 2012-01-11 (×8): qty 1

## 2012-01-11 MED ORDER — SODIUM CHLORIDE 0.9 % IV SOLN: 250.0000 mL | INTRAVENOUS | Status: DC | PRN

## 2012-01-11 MED ORDER — POTASSIUM CHLORIDE CRYS ER 20 MEQ PO TBCR
20.0000 meq | EXTENDED_RELEASE_TABLET | Freq: Four times a day (QID) | ORAL | Status: DC
  Administered 2012-01-12 (×2): 20 meq via ORAL
  Filled 2012-01-11 (×5): qty 1

## 2012-01-11 MED ORDER — POTASSIUM CHLORIDE CRYS ER 10 MEQ PO TBCR
10.0000 meq | EXTENDED_RELEASE_TABLET | Freq: Two times a day (BID) | ORAL | Status: DC
  Administered 2012-01-13 – 2012-01-14 (×3): 10 meq via ORAL
  Filled 2012-01-11 (×4): qty 1

## 2012-01-11 MED ORDER — SODIUM CHLORIDE 0.9 % IJ SOLN
3.0000 mL | Freq: Two times a day (BID) | INTRAMUSCULAR | Status: DC
  Administered 2012-01-11 – 2012-01-18 (×14): 3 mL via INTRAVENOUS

## 2012-01-11 MED ORDER — CARVEDILOL 3.125 MG PO TABS
3.1250 mg | ORAL_TABLET | Freq: Two times a day (BID) | ORAL | Status: DC
  Administered 2012-01-11 – 2012-01-18 (×15): 3.125 mg via ORAL
  Filled 2012-01-11 (×16): qty 1

## 2012-01-11 MED ORDER — POTASSIUM CHLORIDE CRYS ER 10 MEQ PO TBCR
10.0000 meq | EXTENDED_RELEASE_TABLET | Freq: Two times a day (BID) | ORAL | Status: DC
  Administered 2012-01-11: 10 meq via ORAL
  Filled 2012-01-11 (×2): qty 1

## 2012-01-11 MED ORDER — SIMVASTATIN 20 MG PO TABS
20.0000 mg | ORAL_TABLET | Freq: Every day | ORAL | Status: DC
  Filled 2012-01-11: qty 1

## 2012-01-11 MED ORDER — POTASSIUM CHLORIDE CRYS ER 20 MEQ PO TBCR
20.0000 meq | EXTENDED_RELEASE_TABLET | Freq: Four times a day (QID) | ORAL | Status: DC
  Administered 2012-01-11: 20 meq via ORAL

## 2012-01-11 MED ORDER — ENOXAPARIN SODIUM 30 MG/0.3ML ~~LOC~~ SOLN
30.0000 mg | SUBCUTANEOUS | Status: DC
  Administered 2012-01-11 – 2012-01-18 (×8): 30 mg via SUBCUTANEOUS
  Filled 2012-01-11 (×8): qty 0.3

## 2012-01-11 MED ORDER — ONDANSETRON HCL 4 MG/2ML IJ SOLN: 4.0000 mg | Freq: Four times a day (QID) | INTRAMUSCULAR | Status: DC | PRN

## 2012-01-11 MED ORDER — SIMVASTATIN 20 MG PO TABS: 20.0000 mg | ORAL_TABLET | Freq: Every day | ORAL | Status: DC

## 2012-01-11 MED ORDER — RAMIPRIL 2.5 MG PO CAPS
2.5000 mg | ORAL_CAPSULE | Freq: Two times a day (BID) | ORAL | Status: DC
  Administered 2012-01-11 – 2012-01-12 (×3): 2.5 mg via ORAL
  Filled 2012-01-11 (×6): qty 1

## 2012-01-11 MED ORDER — FUROSEMIDE 10 MG/ML IJ SOLN
40.0000 mg | Freq: Two times a day (BID) | INTRAMUSCULAR | Status: AC
  Administered 2012-01-11 – 2012-01-12 (×2): 40 mg via INTRAVENOUS
  Filled 2012-01-11 (×2): qty 4

## 2012-01-11 MED ORDER — AMIODARONE HCL 200 MG PO TABS
200.0000 mg | ORAL_TABLET | Freq: Every day | ORAL | Status: DC
  Administered 2012-01-12 – 2012-01-18 (×6): 200 mg via ORAL
  Filled 2012-01-11 (×8): qty 1

## 2012-01-11 MED ORDER — PANTOPRAZOLE SODIUM 40 MG PO TBEC
40.0000 mg | DELAYED_RELEASE_TABLET | Freq: Every day | ORAL | Status: DC
  Administered 2012-01-12 – 2012-01-18 (×7): 40 mg via ORAL
  Filled 2012-01-11 (×7): qty 1

## 2012-01-11 MED ORDER — SODIUM CHLORIDE 0.9 % IJ SOLN: 3.0000 mL | INTRAMUSCULAR | Status: DC | PRN

## 2012-01-11 MED ORDER — DILTIAZEM HCL ER COATED BEADS 120 MG PO CP24
120.0000 mg | ORAL_CAPSULE | Freq: Every day | ORAL | Status: DC
  Administered 2012-01-12 – 2012-01-13 (×2): 120 mg via ORAL
  Filled 2012-01-11 (×4): qty 1

## 2012-01-11 MED ORDER — ACETAMINOPHEN 325 MG PO TABS
650.0000 mg | ORAL_TABLET | ORAL | Status: DC | PRN
  Administered 2012-01-13: 650 mg via ORAL
  Filled 2012-01-11 (×2): qty 2

## 2012-01-11 MED ORDER — POTASSIUM CHLORIDE CRYS ER 20 MEQ PO TBCR
20.0000 meq | EXTENDED_RELEASE_TABLET | Freq: Four times a day (QID) | ORAL | Status: DC
  Filled 2012-01-11: qty 1

## 2012-01-11 NOTE — H&P (Signed)
[name] is an 78 y.o. female.   Chief Complaint: leg edema and shortness of breath. HPI: 78 years old black female with 1 week history of leg edema and shortness of breath without fever and chest pain but with some cough.  Past Medical History  Diagnosis Date  . Coronary artery disease   . Hypertension   . Myocardial infarction   . Hyperlipidemia   . Atrial fibrillation   . Stroke   . DVT (deep venous thrombosis)   . Mesenteric ischemia   . Arthritis   . Weight loss   . Blood clot in vein   . Shortness of breath   . Anemia   . GERD (gastroesophageal reflux disease)       Past Surgical History  Procedure Date  . Abdominal hysterectomy 1964  . Thrombectomy of superior mesenteric artery   . Abdominal aortogram   . Cholecystectomy   . Rt aka     Family History  Problem Relation Age of Onset  . Cancer Brother   . Diabetes Paternal Aunt    Social History:  reports that she has never smoked. She quit smokeless tobacco use about 43 years ago. Her smokeless tobacco use included Snuff. She reports that she does not drink alcohol or use illicit drugs.  Allergies:  Allergies  Allergen Reactions  . Morphine And Related Anaphylaxis  . Penicillins Other (See Comments)    Makes patient sick    Medications Prior to Admission  Medication Dose Route Frequency Provider Last Rate Last Dose  . 0.9 %  sodium chloride infusion  250 mL Intravenous PRN Ricki Rodriguez, MD      . acetaminophen (TYLENOL) tablet 650 mg  650 mg Oral Q4H PRN Ricki Rodriguez, MD      . amiodarone (PACERONE) tablet 200 mg  200 mg Oral Daily Ricki Rodriguez, MD      . carvedilol (COREG) tablet 3.125 mg  3.125 mg Oral BID WC Ricki Rodriguez, MD   3.125 mg at 01/11/12 1725  . diltiazem (CARDIZEM CD) 24 hr capsule 120 mg  120 mg Oral Daily Ricki Rodriguez, MD      . enoxaparin (LOVENOX) injection 30 mg  30 mg Subcutaneous Q24H Ricki Rodriguez, MD   30 mg at 01/11/12 1324  . furosemide (LASIX) injection 40 mg   40 mg Intravenous Q12H Ricki Rodriguez, MD   40 mg at 01/11/12 1325  . ondansetron (ZOFRAN) injection 4 mg  4 mg Intravenous Q6H PRN Ricki Rodriguez, MD      . pantoprazole (PROTONIX) EC tablet 40 mg  40 mg Oral Q1200 Ricki Rodriguez, MD      . potassium chloride (K-DUR,KLOR-CON) CR tablet 10 mEq  10 mEq Oral BID Ricki Rodriguez, MD   10 mEq at 01/11/12 1324  . potassium chloride SA (K-DUR,KLOR-CON) CR tablet 20 mEq  20 mEq Oral QID Ricki Rodriguez, MD      . ramipril (ALTACE) capsule 2.5 mg  2.5 mg Oral BID Ricki Rodriguez, MD   2.5 mg at 01/11/12 1324  . rosuvastatin (CRESTOR) tablet 5 mg  5 mg Oral q1800 Ricki Rodriguez, MD   5 mg at 01/11/12 1725  . sodium chloride 0.9 % injection 3 mL  3 mL Intravenous Q12H Ricki Rodriguez, MD   3 mL at 01/11/12 1325  . sodium chloride 0.9 % injection 3 mL  3 mL Intravenous PRN Ricki Rodriguez, MD      .  DISCONTD: simvastatin (ZOCOR) tablet 20 mg  20 mg Oral q1800 Ricki Rodriguez, MD      . DISCONTD: simvastatin (ZOCOR) tablet 20 mg  20 mg Oral q1800 Ricki Rodriguez, MD       Medications Prior to Admission  Medication Sig Dispense Refill  . atorvastatin (LIPITOR) 10 MG tablet Take 10 mg by mouth daily.        Marland Kitchen co-enzyme Q-10 30 MG capsule Take 100 mg by mouth 3 (three) times daily.        Marland Kitchen diltiazem (CARDIZEM CD) 180 MG 24 hr capsule Take 180 mg by mouth daily.        . ferrous gluconate (FERGON) 246 (28 FE) MG tablet Take 65 mg by mouth daily with breakfast.        . furosemide (LASIX) 20 MG tablet Take 20 mg by mouth daily.       . Multiple Vitamin (MULTIVITAMIN) tablet Take 1 tablet by mouth daily.        Marland Kitchen omeprazole (PRILOSEC) 20 MG capsule Take 20 mg by mouth daily.        . potassium chloride (K-DUR,KLOR-CON) 10 MEQ tablet Take 10 mEq by mouth daily.        . risedronate (ACTONEL) 35 MG tablet Take 35 mg by mouth every 7 (seven) days. with water on empty stomach, nothing by mouth or lie down for next 30 minutes.         Results for orders placed during  the hospital encounter of 01/11/12 (from the past 48 hour(s))  CBC     Status: Abnormal   Collection Time   01/11/12 12:00 PM      Component Value Range Comment   WBC 10.6 (*) 4.0 - 10.5 (K/uL)    RBC 3.81 (*) 3.87 - 5.11 (MIL/uL)    Hemoglobin 10.0 (*) 12.0 - 15.0 (g/dL)    HCT 81.1 (*) 91.4 - 46.0 (%)    MCV 76.9 (*) 78.0 - 100.0 (fL)    MCH 26.2  26.0 - 34.0 (pg)    MCHC 34.1  30.0 - 36.0 (g/dL)    RDW 78.2 (*) 95.6 - 15.5 (%)    Platelets 384  150 - 400 (K/uL)   DIFFERENTIAL     Status: Normal   Collection Time   01/11/12 12:00 PM      Component Value Range Comment   Neutrophils Relative 65  43 - 77 (%)    Neutro Abs 6.8  1.7 - 7.7 (K/uL)    Lymphocytes Relative 27  12 - 46 (%)    Lymphs Abs 2.9  0.7 - 4.0 (K/uL)    Monocytes Relative 8  3 - 12 (%)    Monocytes Absolute 0.8  0.1 - 1.0 (K/uL)    Eosinophils Relative 0  0 - 5 (%)    Eosinophils Absolute 0.0  0.0 - 0.7 (K/uL)    Basophils Relative 0  0 - 1 (%)    Basophils Absolute 0.0  0.0 - 0.1 (K/uL)   COMPREHENSIVE METABOLIC PANEL     Status: Abnormal   Collection Time   01/11/12 12:00 PM      Component Value Range Comment   Sodium 139  135 - 145 (mEq/L)    Potassium 2.9 (*) 3.5 - 5.1 (mEq/L)    Chloride 101  96 - 112 (mEq/L)    CO2 28  19 - 32 (mEq/L)    Glucose, Bld 86  70 - 99 (mg/dL)  BUN 8  6 - 23 (mg/dL)    Creatinine, Ser 0.96  0.50 - 1.10 (mg/dL)    Calcium 8.9  8.4 - 10.5 (mg/dL)    Total Protein 8.4 (*) 6.0 - 8.3 (g/dL)    Albumin 2.5 (*) 3.5 - 5.2 (g/dL)    AST 37  0 - 37 (U/L)    ALT 25  0 - 35 (U/L)    Alkaline Phosphatase 58  39 - 117 (U/L)    Total Bilirubin 0.4  0.3 - 1.2 (mg/dL)    GFR calc non Af Amer 55 (*) >90 (mL/min)    GFR calc Af Amer 64 (*) >90 (mL/min)   PROTIME-INR     Status: Normal   Collection Time   01/11/12 12:00 PM      Component Value Range Comment   Prothrombin Time 14.9  11.6 - 15.2 (seconds)    INR 1.15  0.00 - 1.49    APTT     Status: Normal   Collection Time   01/11/12 12:00  PM      Component Value Range Comment   aPTT 33  24 - 37 (seconds)   MAGNESIUM     Status: Normal   Collection Time   01/11/12 12:00 PM      Component Value Range Comment   Magnesium 1.8  1.5 - 2.5 (mg/dL)   TSH     Status: Abnormal   Collection Time   01/11/12 12:00 PM      Component Value Range Comment   TSH 48.783 (*) 0.350 - 4.500 (uIU/mL)   CARDIAC PANEL(CRET KIN+CKTOT+MB+TROPI)     Status: Normal   Collection Time   01/11/12 12:10 PM      Component Value Range Comment   Total CK 77  7 - 177 (U/L)    CK, MB 2.8  0.3 - 4.0 (ng/mL)    Troponin I <0.30  <0.30 (ng/mL)    Relative Index RELATIVE INDEX IS INVALID  0.0 - 2.5     No results found.  @ROS @ No weight gain, wears glasses, + cataract surgery,  + dentures. No hearing loss, + Tinnitus, No asthma, + chest pain, No palpitations, + leg edema. + right AKA, + diarrhea, + constipation, No hepatitis, + CVA, No seizures, No psych admission.  Blood pressure 159/76, pulse 72, temperature 99 F (37.2 C), temperature source Oral, resp. rate 18, height 5\' 4"  (1.626 m), weight 60.9 kg (134 lb 4.2 oz), SpO2 97.00%. HEENT: The patient is normocephalic and atraumatic, with brown eyes. Pupils  equal, round, and reactive to light. Conjunctiva pink. Sclerae muddy. Mucous  membranes pink and moist.  NECK: + JVD, no carotid bruits.  LUNGS: Basal crackles, bilaterally.  HEART: Normal S1 and S2. A grade 2/6 systolic murmur.  ABDOMEN: Soft, non- tender.  EXTREMITIES: 2 + edema. No cyanosis or clubbing. Right AKA. CNS: Cranial nerves grossly intact. Bilateral equal grips SKIN: Warm and dry.   Assessment/Plan Acute left heart systolic failure  IV Lasix Atrial fibrillation  PO Amiodarone Hypertension Right AKA Anemia of chronic disease, iron deficiency  Slow Iron  Stuart Mirabile S 01/11/2012, 7:03 PM

## 2012-01-11 NOTE — Progress Notes (Signed)
Pt K 2.9 with last labs drawn.  MD has been made aware.  Will continue to monitor. Nino Glow RN

## 2012-01-11 NOTE — Progress Notes (Signed)
[name] direct admit from Dr. Roseanne Kaufman office via car transport by daughter-in-law and caretaker.  Both at bedside.  Dr. Algie Coffer contacted at office for admission order entry. [name], RN 01/11/12 @ 1050

## 2012-01-12 ENCOUNTER — Other Ambulatory Visit: Payer: Self-pay

## 2012-01-12 LAB — CARDIAC PANEL(CRET KIN+CKTOT+MB+TROPI)
CK, MB: 2.2 ng/mL (ref 0.3–4.0)
Total CK: 69 U/L (ref 7–177)
Troponin I: <0.3 ng/mL (ref <0.30)

## 2012-01-12 LAB — BASIC METABOLIC PANEL
BUN: 12 mg/dL (ref 6–23)
Calcium: 8.9 mg/dL (ref 8.4–10.5)
GFR calc Af Amer: 53 mL/min — ABNORMAL LOW (ref >90)
GFR calc non Af Amer: 45 mL/min — ABNORMAL LOW (ref >90)
Glucose, Bld: 97 mg/dL (ref 70–99)
Potassium: 3.7 mEq/L (ref 3.5–5.1)

## 2012-01-12 MED ORDER — AZITHROMYCIN 250 MG PO TABS
250.0000 mg | ORAL_TABLET | Freq: Every day | ORAL | Status: DC
  Administered 2012-01-13: 250 mg via ORAL
  Filled 2012-01-12: qty 1

## 2012-01-12 MED ORDER — GUAIFENESIN 100 MG/5ML PO SOLN
5.0000 mL | Freq: Four times a day (QID) | ORAL | Status: DC
  Administered 2012-01-12 – 2012-01-18 (×26): 100 mg via ORAL
  Filled 2012-01-12 (×28): qty 5

## 2012-01-12 MED ORDER — AZITHROMYCIN 500 MG PO TABS
500.0000 mg | ORAL_TABLET | Freq: Every day | ORAL | Status: AC
  Administered 2012-01-12: 500 mg via ORAL
  Filled 2012-01-12: qty 1

## 2012-01-12 NOTE — Progress Notes (Signed)
PT Cancellation Note  Treatment cancelled today due to patient's refusal to participate.  Attempted for third time today and pt again declining PT.  Will try again tomorrow.    Sunny Schlein, [place] [phone] 01/12/2012, 12:00 PM

## 2012-01-12 NOTE — Progress Notes (Signed)
  Echocardiogram 2D Echocardiogram has been performed.  Juanita Laster Jennfier Abdulla 01/12/2012, 9:39 AM

## 2012-01-12 NOTE — Progress Notes (Signed)
PT Cancellation Note  Attempted to see pt x2 this am, however [place] interrupted and then pt declined secondary to wanting to bathe.     Sunny Schlein, [place] [phone] 01/12/2012, 9:42 AM

## 2012-01-12 NOTE — Plan of Care (Signed)
Problem: Consults Goal: Heart Failure Patient Education (See Patient Education module for education specifics.)  Outcome: Progressing Given HF booklet & started going over how to limit salt intake in her diet T [employer]

## 2012-01-12 NOTE — Progress Notes (Signed)
01/12/12 1330 UR Completed. Tera Mater, RN, BSN

## 2012-01-12 NOTE — Progress Notes (Signed)
Subjective:  + Cough. No leg edema.  Objective:  Vital Signs in the last 24 hours: Temp:  [98.7 F (37.1 C)-99.4 F (37.4 C)] 98.7 F (37.1 C) (02/07 0700) Pulse Rate:  [64-85] 64  (02/07 0700) Cardiac Rhythm:  [-] Other (Comment) (02/06 1055) Resp:  [18-19] 18  (02/07 0700) BP: (102-159)/(61-78) 110/61 mmHg (02/07 0700) SpO2:  [97 %-99 %] 98 % (02/07 0700) Weight:  [57.5 kg (126 lb 12.2 oz)-60.9 kg (134 lb 4.2 oz)] 57.5 kg (126 lb 12.2 oz) (02/07 0425)  Physical Exam: BP Readings from Last 1 Encounters:  01/12/12 110/61    Wt Readings from Last 1 Encounters:  01/12/12 57.5 kg (126 lb 12.2 oz)    Weight change:   HEENT: [place]/AT, Eyes-Brown, PERL, EOMI, Conjunctiva-Pink, Sclera-Non-icteric Neck: + JVD, No bruit, Trachea midline. Lungs:  Clearing, Bilateral. Cardiac:  Regular rhythm, normal S1 and S2, no S3.  Abdomen:  Soft, non-tender. Extremities:  No edema present. No cyanosis. No clubbing. CNS: AxOx3, Cranial nerves grossly intact, moves all 4 extremities. Right handed. Skin: Warm and dry.   Intake/Output from previous day: 02/06 0701 - 02/07 0700 In: 444 [P.O.:440; IV Piggyback:4] Out: 2900 [Urine:2900]    Lab Results: BMET    Component Value Date/Time   NA 135 01/12/2012 0400   K 3.7 01/12/2012 0400   CL 97 01/12/2012 0400   CO2 30 01/12/2012 0400   GLUCOSE 97 01/12/2012 0400   BUN 12 01/12/2012 0400   CREATININE 1.08 01/12/2012 0400   CALCIUM 8.9 01/12/2012 0400   GFRNONAA 45* 01/12/2012 0400   GFRAA 53* 01/12/2012 0400   CBC    Component Value Date/Time   WBC 10.6* 01/11/2012 1200   RBC 3.81* 01/11/2012 1200   HGB 10.0* 01/11/2012 1200   HCT 29.3* 01/11/2012 1200   PLT 384 01/11/2012 1200   MCV 76.9* 01/11/2012 1200   MCH 26.2 01/11/2012 1200   MCHC 34.1 01/11/2012 1200   RDW 16.5* 01/11/2012 1200   LYMPHSABS 2.9 01/11/2012 1200   MONOABS 0.8 01/11/2012 1200   EOSABS 0.0 01/11/2012 1200   BASOSABS 0.0 01/11/2012 1200   CARDIAC ENZYMES Lab Results  Component Value Date   CKTOTAL 69  01/12/2012   CKMB 2.2 01/12/2012   TROPONINI <0.30 01/12/2012    Assessment/Plan:  Patient Active Hospital Problem List:  Acute left heart systolic failure   IV Lasix  Atrial fibrillation   PO Amiodarone  Hypertension  Right AKA  Anemia of chronic disease, iron deficiency   Slow Iron Cough/Bronchitis  Z-pak + Robitussin   LOS: 1 day    Orpah Cobb  MD  01/12/2012, 9:40 AM

## 2012-01-13 LAB — BASIC METABOLIC PANEL
BUN: 12 mg/dL (ref 6–23)
Calcium: 8.8 mg/dL (ref 8.4–10.5)
Creatinine, Ser: 0.99 mg/dL (ref 0.50–1.10)
GFR calc Af Amer: 59 mL/min — ABNORMAL LOW (ref >90)
GFR calc non Af Amer: 51 mL/min — ABNORMAL LOW (ref >90)

## 2012-01-13 MED ORDER — FUROSEMIDE 10 MG/ML IJ SOLN
40.0000 mg | Freq: Once | INTRAMUSCULAR | Status: AC
  Administered 2012-01-13: 40 mg via INTRAVENOUS
  Filled 2012-01-13: qty 4

## 2012-01-13 MED ORDER — NITROGLYCERIN 0.2 MG/HR TD PT24
0.2000 mg | MEDICATED_PATCH | Freq: Every day | TRANSDERMAL | Status: DC
  Administered 2012-01-13: 0.2 mg via TRANSDERMAL
  Filled 2012-01-13 (×2): qty 1

## 2012-01-13 MED ORDER — RAMIPRIL 5 MG PO CAPS
5.0000 mg | ORAL_CAPSULE | Freq: Two times a day (BID) | ORAL | Status: DC
  Administered 2012-01-13 – 2012-01-18 (×10): 5 mg via ORAL
  Filled 2012-01-13 (×12): qty 1

## 2012-01-13 NOTE — Evaluation (Signed)
Physical Therapy Evaluation Patient Details Name: [name] MRN: [id number] DOB: 1925-03-25 Today's Date: 01/13/2012  Problem List:  Patient Active Problem List  Diagnoses  . Weight loss  . Wears dentures  . High blood pressure  . Heart disease  . High cholesterol  . Nausea / vomiting/diarrhea  . Abdominal pain  . Anemia  . Glaucoma or retinopathy  . Arthritis  . Family history of breast cancer  . PAD (peripheral artery disease)  . Systolic CHF, acute    Past Medical History:  Past Medical History  Diagnosis Date  . Coronary artery disease   . Hypertension   . Myocardial infarction   . Hyperlipidemia   . Atrial fibrillation   . Stroke   . DVT (deep venous thrombosis)   . Mesenteric ischemia   . Arthritis   . Weight loss   . Blood clot in vein   . Shortness of breath   . Anemia   . GERD (gastroesophageal reflux disease)    Past Surgical History:  Past Surgical History  Procedure Date  . Abdominal hysterectomy 1964  . Thrombectomy of superior mesenteric artery   . Abdominal aortogram   . Cholecystectomy   . Rt aka     PT Assessment/Plan/Recommendation PT Assessment Clinical Impression Statement: pt is an 78 y/o female adm. with SOB and LE edema due to HF.  She could benefit from PT to address mobility deficits (AKA within last 6 months) and to address decr. activity tolerance.  Pt having some difficulty mobilizing between pieces of equipment that are not like her home equipment.  Recommend continued PT after D/C PT Recommendation/Assessment: Patient will need skilled PT in the acute care venue PT Problem List: Decreased strength;Decreased activity tolerance;Decreased balance;Decreased mobility;Decreased safety awareness;Decreased knowledge of use of DME PT Therapy Diagnosis : Generalized weakness;Other (comment) (difficulty mobilizing) PT Plan PT Frequency: Min 3X/week PT Treatment/Interventions: Functional mobility training;Therapeutic  activities;Balance training;Patient/family education PT Recommendation Follow Up Recommendations: Outpatient PT (vs HHPT (pt to continue ongoing PT)) Equipment Recommended: Defer to next venue PT Goals  Acute Rehab PT Goals PT Goal Formulation: With patient Time For Goal Achievement: 7 days Pt will go Supine/Side to Sit: with modified independence PT Goal: Supine/Side to Sit - Progress: Goal set today Pt will go Sit to Stand: with min assist PT Goal: Sit to Stand - Progress: Goal set today Pt will Transfer Bed to Chair/Chair to Bed: Other (comment) (min guard A) PT Transfer Goal: Bed to Chair/Chair to Bed - Progress: Goal set today  PT Evaluation Precautions/Restrictions  Precautions Precautions: Fall Prior Functioning  Home Living Lives With: Son;Other (Comment) (daughter in law and 2 grandchildren) Receives Help From: Family;Personal care attendant (PCA   M-F 8am to 6pm) Type of Home: House Home Layout: Two level;Able to live on main level with bedroom/bathroom Alternate Level Stairs-Number of Steps:  (uses a stair lift to get to the family room downstairs) Home Access: Stairs to enter [employer] of Steps:  (family bumps her up the steps in the W/C) Bathroom Shower/Tub: Tub/shower unit;Curtain Bathroom Toilet: Standard Home Adaptive Equipment: Bedside commode/3-in-1;Grab bars in shower;Tub transfer bench;Wheelchair - powered;Reacher (3 wheel walker, ) Additional Comments: pt has 24hr A between family and Caregivers.  pt notes CG A's to wash feet and start lowe body dressing and homemaking.   Prior Function Level of Independence: Needs assistance with ADLs;Independent with transfers;Needs assistance with homemaking Able to Take Stairs?: No Cognition Cognition Arousal/Alertness: Awake/alert Overall Cognitive Status: Appears within functional limits  for tasks assessed Sensation/Coordination Coordination Gross Motor Movements are Fluid and Coordinated: Yes Fine  Motor Movements are Fluid and Coordinated: Not tested Extremity Assessment RUE Assessment RUE Assessment: Within Functional Limits LUE Assessment LUE Assessment: Within Functional Limits RLE Assessment RLE Assessment: Within Functional Limits (strength grossly 3/5) LLE Assessment LLE Assessment: Within Functional Limits Mobility (including Balance) Bed Mobility Bed Mobility: No Transfers Transfers: Yes Sit to Stand: 3: Mod assist;With upper extremity assist;With armrests;From chair/3-in-1;Other (comment) (pt can come to 3/4 stand for pericare without assist) Sit to Stand Details (indicate cue type and reason): pt can push up without assist,but needs assist to come forward over her BOS Stand Pivot Transfers: 3: Mod assist Stand Pivot Transfer Details (indicate cue type and reason): assist to get over BOS Squat Pivot Transfers: 4: Min assist;With upper extremity assistance;With armrests Squat Pivot Transfer Details (indicate cue type and reason): pt needs vc's to deal with technique and hand placement when the set up is different from what she has practiced with. Ambulation/Gait Ambulation/Gait: No  Posture/Postural Control Posture/Postural Control: No significant limitations Balance Balance Assessed: Yes Static Sitting Balance Static Sitting - Balance Support: No upper extremity supported;Feet supported Static Sitting - Level of Assistance: 5: Stand by assistance Dynamic Sitting Balance Dynamic Sitting - Balance Support: Right upper extremity supported;Left upper extremity supported;Feet supported Dynamic Sitting - Level of Assistance: 4: Min assist Exercise    End of Session PT - End of Session Activity Tolerance: Patient tolerated treatment well Patient left: in bed;with call bell in reach Nurse Communication: Mobility status for transfers General Behavior During Session: [facility] for tasks performed Cognition: [facility] for tasks performed  [name], [name] 01/13/2012, 1:38  PM  01/13/2012  Encinal Bing, PT [phone] (pager)

## 2012-01-13 NOTE — Progress Notes (Signed)
01/13/12 0820 Nursing Note: Pt is complaining of feelings SOB and fullness in chest. Pt repositioned and put on 1 liter of oxygen. Vital signs 127/82 100% 1 liter, 67 Sinus rhythm . Pt states " Its helping". Will continue to monitor pt. Helayne Metsker Scientist, clinical (histocompatibility and immunogenetics).

## 2012-01-13 NOTE — Progress Notes (Signed)
Subjective:  Shortness of breath last night. Feeling better with supplemental oxygen.  Objective:  Vital Signs in the last 24 hours: Temp:  [98.5 F (36.9 C)-99 F (37.2 C)] 98.5 F (36.9 C) (02/08 0442) Pulse Rate:  [61-68] 66  (02/08 0442) Cardiac Rhythm:  [-] Heart block (02/08 0532) Resp:  [18-20] 18  (02/08 0442) BP: (105-144)/(67-73) 144/73 mmHg (02/08 0442) SpO2:  [93 %-98 %] 93 % (02/08 0442) Weight:  [56.2 kg (123 lb 14.4 oz)] 56.2 kg (123 lb 14.4 oz) (02/08 0442)  Physical Exam: BP Readings from Last 1 Encounters:  01/13/12 144/73    Wt Readings from Last 1 Encounters:  01/13/12 56.2 kg (123 lb 14.4 oz)    Weight change: -4.7 kg (-10 lb 5.8 oz)  HEENT: [place]/AT, Eyes-Brown, PERL, EOMI, Conjunctiva-Pink, Sclera-Non-icteric Neck: No JVD, No bruit, Trachea midline. Lungs:  Clear, Bilateral. Cardiac:  Regular rhythm, normal S1 and S2, no S3.  Abdomen:  Soft, non-tender. Extremities:  No edema present. No cyanosis. No clubbing. CNS: AxOx3, Cranial nerves grossly intact, moves all 4 extremities. Right handed. Skin: Warm and dry.   Intake/Output from previous day: 02/07 0701 - 02/08 0700 In: 1266 [P.O.:1260; I.V.:6] Out: 976 [Urine:975; Stool:1]    Lab Results: BMET    Component Value Date/Time   NA 137 01/13/2012 0417   K 3.7 01/13/2012 0417   CL 98 01/13/2012 0417   CO2 31 01/13/2012 0417   GLUCOSE 97 01/13/2012 0417   BUN 12 01/13/2012 0417   CREATININE 0.99 01/13/2012 0417   CALCIUM 8.8 01/13/2012 0417   GFRNONAA 51* 01/13/2012 0417   GFRAA 59* 01/13/2012 0417   CBC    Component Value Date/Time   WBC 10.6* 01/11/2012 1200   RBC 3.81* 01/11/2012 1200   HGB 10.0* 01/11/2012 1200   HCT 29.3* 01/11/2012 1200   PLT 384 01/11/2012 1200   MCV 76.9* 01/11/2012 1200   MCH 26.2 01/11/2012 1200   MCHC 34.1 01/11/2012 1200   RDW 16.5* 01/11/2012 1200   LYMPHSABS 2.9 01/11/2012 1200   MONOABS 0.8 01/11/2012 1200   EOSABS 0.0 01/11/2012 1200   BASOSABS 0.0 01/11/2012 1200   CARDIAC ENZYMES Lab  Results  Component Value Date   CKTOTAL 69 01/12/2012   CKMB 2.2 01/12/2012   TROPONINI <0.30 01/12/2012    Assessment/Plan:  Patient Active Hospital Problem List:  Acute left heart systolic failure   Continue IV Lasix  Atrial fibrillation   PO Amiodarone  Hypertension  Right AKA  Anemia of chronic disease, iron deficiency   Slow Iron  Cough/Bronchitis   Z-pak + Robitussin    LOS: 2 days    Orpah Cobb  MD  01/13/2012, 9:37 AM

## 2012-01-13 NOTE — Plan of Care (Signed)
Problem: Phase I Progression Outcomes Goal: EF % per last Echo/documented,Core Reminder form on chart Outcome: Completed/Met Date Met:  01/13/12 EF 45%-50% based on echo obtained 01/12/12

## 2012-01-14 ENCOUNTER — Inpatient Hospital Stay (HOSPITAL_COMMUNITY): Payer: Medicare Other

## 2012-01-14 LAB — BASIC METABOLIC PANEL
BUN: 13 mg/dL (ref 6–23)
Calcium: 8.7 mg/dL (ref 8.4–10.5)
Chloride: 95 mEq/L — ABNORMAL LOW (ref 96–112)
Creatinine, Ser: 1.13 mg/dL — ABNORMAL HIGH (ref 0.50–1.10)
GFR calc Af Amer: 50 mL/min — ABNORMAL LOW (ref >90)

## 2012-01-14 LAB — CBC
HCT: 28.4 % — ABNORMAL LOW (ref 36.0–46.0)
Hemoglobin: 9.7 g/dL — ABNORMAL LOW (ref 12.0–15.0)
MCH: 26.7 pg (ref 26.0–34.0)
MCH: 26.1 pg (ref 26.0–34.0)
MCHC: 35.3 g/dL (ref 30.0–36.0)
MCHC: 34.2 g/dL (ref 30.0–36.0)
Platelets: 410 10*3/uL — ABNORMAL HIGH (ref 150–400)
RDW: 15.9 % — ABNORMAL HIGH (ref 11.5–15.5)
RDW: 15.9 % — ABNORMAL HIGH (ref 11.5–15.5)

## 2012-01-14 LAB — DIFFERENTIAL
Basophils Absolute: 0 10*3/uL (ref 0.0–0.1)
Basophils Relative: 0 % (ref 0–1)
Eosinophils Absolute: 0 10*3/uL (ref 0.0–0.7)
Neutrophils Relative %: 71 % (ref 43–77)

## 2012-01-14 MED ORDER — VANCOMYCIN HCL IN DEXTROSE 1-5 GM/200ML-% IV SOLN
1000.0000 mg | Freq: Once | INTRAVENOUS | Status: AC
  Administered 2012-01-14: 1000 mg via INTRAVENOUS
  Filled 2012-01-14: qty 200

## 2012-01-14 MED ORDER — VANCOMYCIN HCL IN DEXTROSE 1-5 GM/200ML-% IV SOLN
1000.0000 mg | INTRAVENOUS | Status: DC
  Administered 2012-01-15: 1000 mg via INTRAVENOUS
  Filled 2012-01-14 (×2): qty 200

## 2012-01-14 MED ORDER — LEVOTHYROXINE SODIUM 100 MCG PO TABS
100.0000 ug | ORAL_TABLET | Freq: Every day | ORAL | Status: DC
  Administered 2012-01-15 – 2012-01-18 (×4): 100 ug via ORAL
  Filled 2012-01-14 (×6): qty 1

## 2012-01-14 NOTE — Progress Notes (Signed)
1000 Blood pressure 87.63  Placed a call to MD . BP meds put on hold until further ordersn . Pt will be continuedv tyo monitor

## 2012-01-14 NOTE — Progress Notes (Addendum)
ANTIBIOTIC CONSULT NOTE - INITIAL  Pharmacy Consult for Vancomycin and Zosyn Indication: fevers/cough   Allergies  Allergen Reactions  . Morphine And Related Anaphylaxis  . Penicillins Other (See Comments)    Makes patient sick    Patient Measurements: Height: 5\' 4"  (162.6 cm) (bed scale patient is right bka) Weight: 123 lb 14.4 oz (56.2 kg) IBW/kg (Calculated) : 54.7   Vital Signs: Temp: 101.8 F (38.8 C) (02/08 2208) Temp src: Oral (02/08 2208) BP: 109/70 mmHg (02/08 2208) Pulse Rate: 71  (02/08 2208) Intake/Output from previous day: 02/08 0701 - 02/09 0700 In: 823 [P.O.:820; I.V.:3] Out: 800 [Urine:800] Intake/Output from this shift: Total I/O In: 3 [I.V.:3] Out: -   Labs:  Basename 01/13/12 0417 01/12/12 0400 01/11/12 1200  WBC -- -- 10.6*  HGB -- -- 10.0*  PLT -- -- 384  LABCREA -- -- --  CREATININE 0.99 1.08 0.92   Estimated Creatinine Clearance: 35.9 ml/min (by C-G formula based on Cr of 0.99).  Medical History: Past Medical History  Diagnosis Date  . Coronary artery disease   . Hypertension   . Myocardial infarction   . Hyperlipidemia   . Atrial fibrillation   . Stroke   . DVT (deep venous thrombosis)   . Mesenteric ischemia   . Arthritis   . Weight loss   . Blood clot in vein   . Shortness of breath   . Anemia   . GERD (gastroesophageal reflux disease)     Medications:  Prescriptions prior to admission  Medication Sig Dispense Refill  . amiodarone (PACERONE) 200 MG tablet Take 200 mg by mouth daily.      Marland Kitchen atorvastatin (LIPITOR) 10 MG tablet Take 10 mg by mouth daily.        Marland Kitchen co-enzyme Q-10 30 MG capsule Take 100 mg by mouth 3 (three) times daily.        Marland Kitchen diltiazem (CARDIZEM CD) 180 MG 24 hr capsule Take 180 mg by mouth daily.        Marland Kitchen docusate sodium (COLACE) 100 MG capsule Take 100 mg by mouth daily as needed. For constipation      . ferrous gluconate (FERGON) 246 (28 FE) MG tablet Take 65 mg by mouth daily with breakfast.        .  furosemide (LASIX) 20 MG tablet Take 20 mg by mouth daily.       . Multiple Vitamin (MULTIVITAMIN) tablet Take 1 tablet by mouth daily.        Marland Kitchen omeprazole (PRILOSEC) 20 MG capsule Take 20 mg by mouth daily.        Marland Kitchen OVER THE COUNTER MEDICATION Take 30 mLs by mouth every 4 (four) hours as needed. For cough  Adult multi-symptom cold medicine      . potassium chloride (K-DUR,KLOR-CON) 10 MEQ tablet Take 10 mEq by mouth daily.        . risedronate (ACTONEL) 35 MG tablet Take 35 mg by mouth every 7 (seven) days. with water on empty stomach, nothing by mouth or lie down for next 30 minutes.       Marland Kitchen DISCONTD: ciprofloxacin (CIPRO) 500 MG tablet Take 500 mg by mouth 2 (two) times daily.        Marland Kitchen DISCONTD: HYDROcodone-acetaminophen (NORCO) 10-325 MG per tablet Take 1 tablet by mouth every 6 (six) hours as needed.        Marland Kitchen DISCONTD: lisinopril (PRINIVIL,ZESTRIL) 5 MG tablet Take 5 mg by mouth daily.        Marland Kitchen  DISCONTD: ondansetron (ZOFRAN) 4 MG tablet Take 4 mg by mouth every 6 (six) hours as needed. Verify dosage with patient at next visit. Received at ER over weekend.       Marland Kitchen DISCONTD: promethazine (PHENERGAN) 25 MG tablet Take 25 mg by mouth every 6 (six) hours as needed.        Scheduled:    . amiodarone  200 mg Oral Daily  . carvedilol  3.125 mg Oral BID WC  . diltiazem  120 mg Oral Daily  . enoxaparin  30 mg Subcutaneous Q24H  . furosemide  40 mg Intravenous Once  . guaiFENesin  5 mL Oral QID  . nitroGLYCERIN  0.2 mg Transdermal Daily  . pantoprazole  40 mg Oral Q1200  . potassium chloride  10 mEq Oral BID  . ramipril  5 mg Oral BID  . rosuvastatin  5 mg Oral q1800  . sodium chloride  3 mL Intravenous Q12H  . DISCONTD: azithromycin  250 mg Oral Daily  . DISCONTD: ramipril  2.5 mg Oral BID   Assessment: 78 yo female with fevers for empiric antibiotics  Goal of Therapy:  Vancomycin trough level 15-20 mcg/ml  Plan:  Vancomycin 1 g IV q48h Zosyn 3.375 g IV q8h  Eddie Candle 01/14/2012,12:43 AM  Addendum Pt with h/o intolerance to penicillins.   Changed to Primaxin 250 mg IV q8h.  Geannie Risen, PharmD, BCPS 01/14/2012 1:38 AM

## 2012-01-14 NOTE — Progress Notes (Signed)
Subjective:  Patient complains of a cough with yellowish mucus spiked a temp to 101.8 last night noted to have elevated WBCs Patient denies any urinary complaints Her blood pressure remains in the 80s to 90s BP medications held this morning  Objective:  Vital Signs in the last 24 hours: Temp:  [98.3 F (36.8 C)-101.8 F (38.8 C)] 98.3 F (36.8 C) (02/09 0417) Pulse Rate:  [63-71] 65  (02/09 0900) Resp:  [18] 18  (02/09 0417) BP: (81-132)/(61-82) 87/61 mmHg (02/09 0900) SpO2:  [92 %-95 %] 92 % (02/09 0900) Weight:  [59.6 kg (131 lb 6.3 oz)] 59.6 kg (131 lb 6.3 oz) (02/09 0417)  Intake/Output from previous day: 02/08 0701 - 02/09 0700 In: 1143 [P.O.:940; I.V.:3; IV Piggyback:200] Out: 1300 [Urine:1300] Intake/Output from this shift: Total I/O In: 240 [P.O.:240] Out: -   Physical Exam: General appearance: alert and cooperative Neck: no carotid bruit, no JVD and supple, symmetrical, trachea midline Lungs: Occasional bilateral rhonchi and basilar Rales noted Heart: regular rate and rhythm, S1, S2 normal and Soft systolic murmur and S3 gallop noted Abdomen: soft, non-tender; bowel sounds normal; no masses,  no organomegaly Extremities: extremities normal, atraumatic, no cyanosis or edema  Lab Results:  Basename 01/14/12 0600 01/14/12 0103  WBC 14.3* 15.9*  HGB 9.7* 9.5*  PLT 392 410*    Basename 01/14/12 0600 01/13/12 0417  NA 133* 137  K 3.7 3.7  CL 95* 98  CO2 30 31  GLUCOSE 98 97  BUN 13 12  CREATININE 1.13* 0.99    Basename 01/12/12 0400 01/11/12 2026  TROPONINI <0.30 <0.30   Hepatic Function Panel  Basename 01/11/12 1200  PROT 8.4*  ALBUMIN 2.5*  AST 37  ALT 25  ALKPHOS 58  BILITOT 0.4  BILIDIR --  IBILI --   No results found for this basename: CHOL in the last 72 hours No results found for this basename: PROTIME in the last 72 hours  Imaging: Dg Chest Port 1 View  01/14/2012  *RADIOLOGY REPORT*  Clinical indication:  Fevers.  PORTABLE CHEST - 1  VIEW  Comparison: 01/09/2012  Findings: Heart size appears mildly enlarged.  No pleural effusion identified.  Diffusely increased interstitial markings are stable from previous exam.  No airspace consolidation.  IMPRESSION:  1.  No change in aeration to the lungs compared with 01/09/2012. 2.  No evidence for pneumonia.  Original Report Authenticated By: Rosealee Albee, M.D.    Cardiac Studies:  Assessment/Plan:  Resolving acute systolic heart failure Valvular heart disease Hypothyroidism Peripheral vascular disease Status post right above-knee amputation Hypothyroidism Plan As per orders  LOS: 3 days    [name] N 01/14/2012, 10:57 AM

## 2012-01-14 NOTE — Progress Notes (Signed)
1040 Dr. Sharyn Lull called back with orders re :low BP . Carried out

## 2012-01-15 NOTE — Progress Notes (Signed)
Subjective patient denies any chest pain or shortness of breath State coughing has improved no further fever or chills Blood cultures are still pending  Objective:  Vital Signs in the last 24 hours: Temp:  [98.6 F (37 C)-99.5 F (37.5 C)] 98.6 F (37 C) (02/10 0413) Pulse Rate:  [64-70] 65  (02/10 0413) Resp:  [18-19] 18  (02/10 0413) BP: (93-107)/(60-72) 107/72 mmHg (02/10 0413) SpO2:  [91 %-94 %] 94 % (02/10 0413) Weight:  [57.7 kg (127 lb 3.3 oz)] 57.7 kg (127 lb 3.3 oz) (02/10 0500)  Intake/Output from previous day: 02/09 0701 - 02/10 0700 In: 650 [P.O.:650] Out: 1002 [Urine:1000; Stool:2] Intake/Output from this shift: Total I/O In: -  Out: 600 [Urine:600]  Physical Exam: General appearance: alert and cooperative Neck: no carotid bruit, no JVD and supple, symmetrical, trachea midline Lungs: Decreased breath sounds at bases with occasional rales Heart: regular rate and rhythm, S1, S2 normal and Soft systolic murmur and S3 gallop noted Abdomen: soft, non-tender; bowel sounds normal; no masses,  no organomegaly Extremities: No clubbing cyanosis edema right above-knee amputation  Lab Results:  Basename 01/14/12 0600 01/14/12 0103  WBC 14.3* 15.9*  HGB 9.7* 9.5*  PLT 392 410*    Basename 01/14/12 0600 01/13/12 0417  NA 133* 137  K 3.7 3.7  CL 95* 98  CO2 30 31  GLUCOSE 98 97  BUN 13 12  CREATININE 1.13* 0.99   No results found for this basename: TROPONINI:2,CK,MB:2 in the last 72 hours Hepatic Function Panel No results found for this basename: PROT,ALBUMIN,AST,ALT,ALKPHOS,BILITOT,BILIDIR,IBILI in the last 72 hours No results found for this basename: CHOL in the last 72 hours No results found for this basename: PROTIME in the last 72 hours  Imaging: Imaging results have been reviewed and Dg Chest Port 1 View  01/14/2012  *RADIOLOGY REPORT*  Clinical indication:  Fevers.  PORTABLE CHEST - 1 VIEW  Comparison: 01/09/2012  Findings: Heart size appears mildly  enlarged.  No pleural effusion identified.  Diffusely increased interstitial markings are stable from previous exam.  No airspace consolidation.  IMPRESSION:  1.  No change in aeration to the lungs compared with 01/09/2012. 2.  No evidence for pneumonia.  Original Report Authenticated By: Rosealee Albee, M.D.    Cardiac Studies:  Assessment/Plan:  Resolving bronchitis Resolving acute systolic heart failure Valvular heart disease Peripheral vascular disease status post right AKA Hypothyroidism Plan Continue present management Check blood cultures and adjust antibiotics as appropriate Dr. Algie Coffer to follow in a.m. check labs in a.m.  LOS: 4 days    Audry Kauzlarich N 01/15/2012, 11:55 AM

## 2012-01-16 LAB — CBC
MCH: 25.9 pg — ABNORMAL LOW (ref 26.0–34.0)
Platelets: 415 10*3/uL — ABNORMAL HIGH (ref 150–400)
RBC: 3.48 MIL/uL — ABNORMAL LOW (ref 3.87–5.11)
WBC: 10.7 10*3/uL — ABNORMAL HIGH (ref 4.0–10.5)

## 2012-01-16 LAB — BASIC METABOLIC PANEL
BUN: 14 mg/dL (ref 6–23)
Creatinine, Ser: 1.14 mg/dL — ABNORMAL HIGH (ref 0.50–1.10)
GFR calc Af Amer: 49 mL/min — ABNORMAL LOW (ref >90)
GFR calc non Af Amer: 43 mL/min — ABNORMAL LOW (ref >90)
Glucose, Bld: 88 mg/dL (ref 70–99)
Potassium: 3.9 mEq/L (ref 3.5–5.1)
Sodium: 137 mEq/L (ref 135–145)

## 2012-01-16 MED ORDER — SODIUM CHLORIDE 0.9 % IV SOLN
250.0000 mg | Freq: Three times a day (TID) | INTRAVENOUS | Status: DC
  Administered 2012-01-16 – 2012-01-17 (×3): 250 mg via INTRAVENOUS
  Filled 2012-01-16 (×5): qty 250

## 2012-01-16 NOTE — Progress Notes (Signed)
Subjective:  Feeling better, Less cough and shortness of breath. No fever.  Objective:  Vital Signs in the last 24 hours: Temp:  [97.9 F (36.6 C)-99 F (37.2 C)] 98.9 F (37.2 C) (02/11 2111) Pulse Rate:  [57-68] 57  (02/11 2111) Cardiac Rhythm:  [-] Normal sinus rhythm (02/11 0730) Resp:  [18-20] 18  (02/11 2111) BP: (108-133)/(65-77) 108/65 mmHg (02/11 2111) SpO2:  [93 %-100 %] 100 % (02/11 2111) Weight:  [56.7 kg (125 lb)] 56.7 kg (125 lb) (02/11 0606)  Physical Exam: BP Readings from Last 1 Encounters:  01/16/12 108/65    Wt Readings from Last 1 Encounters:  01/16/12 56.7 kg (125 lb)    Weight change: -1 kg (-2 lb 3.3 oz)  HEENT: [place]/AT, Eyes-Brown, PERL, EOMI, Conjunctiva-Pink, Sclera-Non-icteric Neck: No JVD, No bruit, Trachea midline. Lungs:  Clear, Bilateral. Cardiac:  Regular rhythm, normal S1 and S2, no S3.  Abdomen:  Soft, non-tender. Extremities:  No edema present. No cyanosis. No clubbing. Right AKA. CNS: AxOx3, Cranial nerves grossly intact, moves all 4 extremities. Right handed. Skin: Warm and dry.   Intake/Output from previous day: 02/10 0701 - 02/11 0700 In: 900 [P.O.:700; IV Piggyback:200] Out: 950 [Urine:950]    Lab Results: BMET    Component Value Date/Time   NA 137 01/16/2012 0608   K 3.9 01/16/2012 0608   CL 99 01/16/2012 0608   CO2 27 01/16/2012 0608   GLUCOSE 88 01/16/2012 0608   BUN 14 01/16/2012 0608   CREATININE 1.14* 01/16/2012 0608   CALCIUM 8.9 01/16/2012 0608   GFRNONAA 43* 01/16/2012 0608   GFRAA 49* 01/16/2012 0608   CBC    Component Value Date/Time   WBC 10.7* 01/16/2012 0608   RBC 3.48* 01/16/2012 0608   HGB 9.0* 01/16/2012 0608   HCT 26.6* 01/16/2012 0608   PLT 415* 01/16/2012 0608   MCV 76.4* 01/16/2012 0608   MCH 25.9* 01/16/2012 0608   MCHC 33.8 01/16/2012 0608   RDW 16.1* 01/16/2012 0608   LYMPHSABS 3.4 01/14/2012 0103   MONOABS 1.1* 01/14/2012 0103   EOSABS 0.0 01/14/2012 0103   BASOSABS 0.0 01/14/2012 0103   CARDIAC ENZYMES Lab  Results  Component Value Date   CKTOTAL 69 01/12/2012   CKMB 2.2 01/12/2012   TROPONINI <0.30 01/12/2012    Assessment/Plan:  Patient Active Hospital Problem List:  Acute left heart systolic failure  Stable Atrial fibrillation  PO Amiodarone  Hypertension  Right AKA  Anemia of chronic disease, iron deficiency  Slow Iron  Cough/Bronchitis/Fever  On Primaxin and Vancomycin pending cultures Hypothyroidism PO Levothyroixine    LOS: 5 days    Orpah Cobb  MD  01/16/2012, 9:34 PM

## 2012-01-16 NOTE — Progress Notes (Signed)
01/16/12  1645 UR Completed. Tera Mater, RN, BSN

## 2012-01-16 NOTE — Progress Notes (Signed)
Physical Therapy Treatment Patient Details Name: [name] MRN: [id number] DOB: 06/05/1925 Today's Date: 01/16/2012  PT Assessment/Plan  PT - Assessment/Plan Comments on Treatment Session: Patient presents with functional mobility close to baseline. Her main issues are due to different set up. PT Plan: Discharge plan remains appropriate;Frequency remains appropriate Equipment Recommended: Defer to next venue PT Goals  Acute Rehab PT Goals PT Goal: Supine/Side to Sit - Progress: Progressing toward goal PT Transfer Goal: Bed to Chair/Chair to Bed - Progress: Progressing toward goal  PT Treatment Precautions/Restrictions  Precautions Precautions: Fall Restrictions Weight Bearing Restrictions: No (RT AKA has prosthesis at home ) Mobility (including Balance) Bed Mobility Supine to Sit: 5: Supervision Supine to Sit Details (indicate cue type and reason): HOB 30 degrees - increased time only Sit to Supine: 6: Modified independent (Device/Increase time) Scooting to [facility]: 5: Set up;With rail Scooting to [facility] Details (indicate cue type and reason): Patient able to perfrom task if bed set appropriately so not attempting to scoot uphill - patient utilizes left lower extremity to assist Transfers Sit to Stand Details (indicate cue type and reason): Patient declines - states she does not do this unless in the parellel bars in PT and does not want to try. Squat Pivot Transfers: 4: Min assist;With armrests Squat Pivot Transfer Details (indicate cue type and reason): To and from 3 in 1 and bed. Patient needs verbal cues for sequencing and technique secondary to difficulty problem solving set up.   Static Sitting Balance Static Sitting - Balance Support: Feet unsupported;No upper extremity supported Static Sitting - Level of Assistance: 6: Modified independent (Device/Increase time) Exercise  Other Exercises Other Exercises: Educated patient in theraband exercises for lower extremity  strengthening - patient states she is aware and perfroms them at home- declines performing them at present (left theraeband in room) End of Session PT - End of Session Equipment Utilized During Treatment: Gait belt Activity Tolerance:  (Self limiting) Patient left: in bed;with call bell in reach Nurse Communication:  (Nurse tech aware - in room) General Behavior During Session: [facility] for tasks performed Cognition: [facility] for tasks performed  Edwyna Perfect, PT  Pager [phone]  01/16/2012, 8:48 AM

## 2012-01-16 NOTE — Progress Notes (Signed)
ANTIBIOTIC CONSULT NOTE - FOLLOW UP  Pharmacy Consult for   Vancomycin & Primaxin Indication:  Empiric pulmonary coverage  Allergies  Allergen Reactions  . Morphine And Related Anaphylaxis  . Penicillins Other (See Comments)    Makes patient sick    Patient Measurements: Height: 5\' 4"  (162.6 cm) (bed scale patient is right bka) Weight: 125 lb (56.7 kg) IBW/kg (Calculated) : 54.7   Vital Signs: Temp: 98.7 F (37.1 C) (02/11 0606) Temp src: Oral (02/11 0606) BP: 118/74 mmHg (02/11 1025) Pulse Rate: 66  (02/11 1025) Intake/Output from previous day: 02/10 0701 - 02/11 0700 In: 900 [P.O.:700; IV Piggyback:200] Out: 950 [Urine:950]  Labs:  [facility] 01/16/12 0608 01/14/12 0600 01/14/12 0103  WBC 10.7* 14.3* 15.9*  HGB 9.0* 9.7* 9.5*  PLT 415* 392 410*  LABCREA -- -- --  CREATININE 1.14* 1.13* --   Estimated Creatinine Clearance: 31.2 ml/min (by C-G formula based on Cr of 1.14).  Assessment:    Day # 3 Vancomycin, s/p Azithromycin 2/7 & 2/8.    Intended Primaxin 250 mg IV q8hrs 2/9, but order not entered at that time, just entered now.   Tmax 99, WBC 10.7.  Blood cultures negative to date.  Goal of Therapy:  Vancomycin trough level 15-20 mcg/ml;  Appropriate Primaxin dose for renal function & infection.  Plan:    Continue Vancomycin 1 gram IV q48 hrs. (given last PM)   Begin Primaxin 250 mg IV q8hrs, as previously intended.  Dennie Fetters, [place] Pager: [phone] 01/16/2012,12:01 PM

## 2012-01-17 LAB — COMPREHENSIVE METABOLIC PANEL
Albumin: 2.2 g/dL — ABNORMAL LOW (ref 3.5–5.2)
Alkaline Phosphatase: 47 U/L (ref 39–117)
BUN: 14 mg/dL (ref 6–23)
Calcium: 8.5 mg/dL (ref 8.4–10.5)
GFR calc Af Amer: 56 mL/min — ABNORMAL LOW (ref >90)
Glucose, Bld: 83 mg/dL (ref 70–99)
Potassium: 3.8 mEq/L (ref 3.5–5.1)
Sodium: 139 mEq/L (ref 135–145)
Total Protein: 7.2 g/dL (ref 6.0–8.3)

## 2012-01-17 LAB — CBC
HCT: 25.1 % — ABNORMAL LOW (ref 36.0–46.0)
Hemoglobin: 8.6 g/dL — ABNORMAL LOW (ref 12.0–15.0)
MCH: 26 pg (ref 26.0–34.0)
MCHC: 34.3 g/dL (ref 30.0–36.0)
RDW: 15.8 % — ABNORMAL HIGH (ref 11.5–15.5)

## 2012-01-17 MED ORDER — CIPROFLOXACIN HCL 500 MG PO TABS
500.0000 mg | ORAL_TABLET | Freq: Two times a day (BID) | ORAL | Status: DC
  Administered 2012-01-17 – 2012-01-18 (×3): 500 mg via ORAL
  Filled 2012-01-17 (×5): qty 1

## 2012-01-17 NOTE — Progress Notes (Signed)
PT Cancellation Note  PT attempted to see patient this afternoon, however treatment cancelled today due to patient's refusal to participate, verbalizing she does not feel well and her feet feel numb. PT will follow up tomorrow.  Thanks!   Romeo Rabon 01/17/2012, 4:56 PM

## 2012-01-17 NOTE — Progress Notes (Signed)
1430 pt complained of feeling numb on L foot. Skin warm and dry to touch denied tingling sensation pulses  present confirmed by doppler . Place a call to Dr. Algie Coffer

## 2012-01-17 NOTE — Progress Notes (Signed)
1454 Dr. Algie Coffer called back and spoken to no further orders given the patient informed

## 2012-01-17 NOTE — Progress Notes (Signed)
Subjective:  Feeling better. No fever.  Objective:  Vital Signs in the last 24 hours: Temp:  [97.9 F (36.6 C)-98.9 F (37.2 C)] 98.7 F (37.1 C) (02/12 0543) Pulse Rate:  [57-68] 62  (02/12 0543) Cardiac Rhythm:  [-] Normal sinus rhythm (02/11 2007) Resp:  [18] 18  (02/12 0543) BP: (108-147)/(65-86) 147/86 mmHg (02/12 0543) SpO2:  [94 %-100 %] 94 % (02/12 0543) Weight:  [58.7 kg (129 lb 6.6 oz)] 58.7 kg (129 lb 6.6 oz) (02/12 0543)  Physical Exam: BP Readings from Last 1 Encounters:  01/17/12 147/86    Wt Readings from Last 1 Encounters:  01/17/12 58.7 kg (129 lb 6.6 oz)    Weight change: 2 kg (4 lb 6.6 oz)  HEENT: [place]/AT, Eyes-Brown, PERL, EOMI, Conjunctiva-Pink, Sclera-Non-icteric Neck: No JVD, No bruit, Trachea midline. Lungs:  Clear, Bilateral. Cardiac:  Regular rhythm, normal S1 and S2, no S3.  Abdomen:  Soft, non-tender. Extremities:  No edema present. No cyanosis. No clubbing. Right AKA CNS: AxOx3, Cranial nerves grossly intact, moves all 4 extremities. Right handed. Skin: Warm and dry.   Intake/Output from previous day: 02/11 0701 - 02/12 0700 In: 860 [P.O.:560; IV Piggyback:300] Out: 2450 [Urine:2450]    Lab Results: BMET    Component Value Date/Time   NA 139 01/17/2012 0530   K 3.8 01/17/2012 0530   CL 101 01/17/2012 0530   CO2 29 01/17/2012 0530   GLUCOSE 83 01/17/2012 0530   BUN 14 01/17/2012 0530   CREATININE 1.02 01/17/2012 0530   CALCIUM 8.5 01/17/2012 0530   GFRNONAA 49* 01/17/2012 0530   GFRAA 56* 01/17/2012 0530   CBC    Component Value Date/Time   WBC 10.8* 01/17/2012 0530   RBC 3.31* 01/17/2012 0530   HGB 8.6* 01/17/2012 0530   HCT 25.1* 01/17/2012 0530   PLT 435* 01/17/2012 0530   MCV 75.8* 01/17/2012 0530   MCH 26.0 01/17/2012 0530   MCHC 34.3 01/17/2012 0530   RDW 15.8* 01/17/2012 0530   LYMPHSABS 3.4 01/14/2012 0103   MONOABS 1.1* 01/14/2012 0103   EOSABS 0.0 01/14/2012 0103   BASOSABS 0.0 01/14/2012 0103   CARDIAC ENZYMES Lab Results  Component  Value Date   CKTOTAL 69 01/12/2012   CKMB 2.2 01/12/2012   TROPONINI <0.30 01/12/2012    Assessment/Plan:  Patient Active Hospital Problem List:  Acute left heart systolic failure   Stable  Atrial fibrillation   PO Amiodarone  Hypertension  Right AKA  Anemia of chronic disease, iron deficiency   Slow Iron  Cough/Bronchitis/Fever   On oral Cipro Hypothyroidism   PO Levothyroixine Increase ambulation Home in AM if stable.   LOS: 6 days    Orpah Cobb  MD  01/17/2012, 9:29 AM

## 2012-01-18 LAB — CREATININE, SERUM
Creatinine, Ser: 1.19 mg/dL — ABNORMAL HIGH (ref 0.50–1.10)
GFR calc Af Amer: 47 mL/min — ABNORMAL LOW (ref >90)

## 2012-01-18 MED ORDER — LEVOTHYROXINE SODIUM 100 MCG PO TABS: 100.0000 ug | ORAL_TABLET | Freq: Every day | ORAL | Status: DC

## 2012-01-18 MED ORDER — CIPROFLOXACIN HCL 500 MG PO TABS: 500.0000 mg | ORAL_TABLET | Freq: Two times a day (BID) | ORAL | Status: AC

## 2012-01-18 MED ORDER — LISINOPRIL 5 MG PO TABS: 5.0000 mg | ORAL_TABLET | Freq: Every day | ORAL | Status: DC

## 2012-01-18 NOTE — Discharge Summary (Signed)
Physician Discharge Summary  Patient ID: JANINE RELLER MRN: [id number] DOB/AGE: 03/12/1925 78 y.o.  Admit date: 01/11/2012 Discharge date: 01/18/2012  Admission Diagnoses: Acute Left heart systolic failure Atrial fibrillation  Hypertension  Right AKA  Anemia of chronic disease, iron deficiency   Discharge Diagnoses:  Principal Problem:  *Systolic CHF, acute, left heart Active Problems: Atrial fibrillation  Hypertension  Right AKA  Anemia of chronic disease, iron deficiency  Cough/Bronchitis/Fever  Hypothyroidism    Discharged Condition: fair  Hospital Course: 78 years old black female with leg edema, shortness of breath developed bronchitis, fever in hospital requiring IV antibiotic along with diuresis. Her condition gradually improved over 1 week. She was discharged home in stable condition.  Consults: None  Significant Diagnostic Studies: labs: Mild anemia, hypokalemia, improved with K + supplementation, Borderline BUN/Cr elevation with Lasix use.   Cardiac graphics:  Echocardiogram:  - Left ventricle: The cavity size was normal. There was moderate concentric hypertrophy. Systolic function was mildly reduced. The estimated ejection fraction was in the range of 45% to 50%. There is moderate hypokinesis of the basalinferior myocardium. Doppler parameters are consistent with abnormal left ventricular relaxation (grade 1 diastolic dysfunction). - Mitral valve: Calcified annulus. Severely calcified leaflets anterior. Moderate regurgitation. - Left atrium: The appendage was moderately dilated. - Tricuspid valve: Moderate regurgitation.  Chest X-ray 1. No change in aeration to the lungs compared with 01/09/2012.  2. No evidence for pneumonia.   Treatments: antibiotics: Primaxin and Vancomycin followed by Cipro and cardiac meds: furosemide  Discharge Exam: Blood pressure 126/73, pulse 65, temperature 99.5 F (37.5 C), temperature source Oral, resp. rate 18, height 5\' 4"   (1.626 m), weight 58.3 kg (128 lb 8.5 oz), SpO2 98.00%. HEENT: The patient is normocephalic and atraumatic, with brown eyes. Pupils  equal, round, and reactive to light. Conjunctiva pale pink. Sclerae muddy. Mucous  membranes pink and moist.  NECK: - JVD, no carotid bruits.  LUNGS: Basal crackles, bilaterally.  HEART: Normal S1 and S2. A grade 2/6 systolic murmur.  ABDOMEN: Soft, non- tender.  EXTREMITIES: No edema. No cyanosis or clubbing. Right AKA.  CNS: Cranial nerves grossly intact. Bilateral equal grips  SKIN: Warm and dry.   Disposition: Home-Health Care Svc   Medication List  As of 01/18/2012  9:50 AM   ASK your doctor about these medications         amiodarone 200 MG tablet   Commonly known as: PACERONE   Take 200 mg by mouth daily.      atorvastatin 10 MG tablet   Commonly known as: LIPITOR   Take 10 mg by mouth daily.      ciprofloxacin 500 MG tablet   Commonly known as: CIPRO   Take 500 mg by mouth 2 (two) times daily.      co-enzyme Q-10 30 MG capsule   Take 100 mg by mouth 3 (three) times daily.      diltiazem 180 MG 24 hr capsule   Commonly known as: CARDIZEM CD   Take 180 mg by mouth daily.      docusate sodium 100 MG capsule   Commonly known as: COLACE   Take 100 mg by mouth daily as needed. For constipation      ferrous gluconate 246 (28 FE) MG tablet   Commonly known as: FERGON   Take 65 mg by mouth daily with breakfast.      furosemide 20 MG tablet   Commonly known as: LASIX   Take 20 mg by mouth  daily.      HYDROcodone-acetaminophen 10-325 MG per tablet   Commonly known as: NORCO   Take 1 tablet by mouth every 6 (six) hours as needed.      lisinopril 5 MG tablet   Commonly known as: PRINIVIL,ZESTRIL   Take 5 mg by mouth daily.      multivitamin tablet   Take 1 tablet by mouth daily.      omeprazole 20 MG capsule   Commonly known as: PRILOSEC   Take 20 mg by mouth daily.      ondansetron 4 MG tablet   Commonly known as: ZOFRAN    Take 4 mg by mouth every 6 (six) hours as needed. Verify dosage with patient at next visit. Received at ER over weekend.      OVER THE COUNTER MEDICATION   Take 30 mLs by mouth every 4 (four) hours as needed. For cough  Adult multi-symptom cold medicine      potassium chloride 10 MEQ tablet   Commonly known as: K-DUR,KLOR-CON   Take 10 mEq by mouth daily.      promethazine 25 MG tablet   Commonly known as: PHENERGAN   Take 25 mg by mouth every 6 (six) hours as needed.      risedronate 35 MG tablet   Commonly known as: ACTONEL   Take 35 mg by mouth every 7 (seven) days. with water on empty stomach, nothing by mouth or lie down for next 30 minutes.             SignedOrpah Cobb S 01/18/2012, 9:50 AM

## 2012-01-18 NOTE — Progress Notes (Signed)
Physical Therapy Treatment Patient Details Name: [name] MRN: [id number] DOB: 03-03-1925 Today's Date: 01/18/2012  PT Assessment/Plan  PT - Assessment/Plan Comments on Treatment Session: Mobility close to baseline.  Pt needs to practice with a more similar set up to her home set up.  She is unable to extrapolate skills from one situation to another PT Plan: Discharge plan remains appropriate Follow Up Recommendations: Outpatient PT Equipment Recommended: Defer to next venue PT Goals  Acute Rehab PT Goals PT Goal: Supine/Side to Sit - Progress: Met PT Goal: Sit to Stand - Progress:  (not addressed per pt) Pt will Transfer Bed to Chair/Chair to Bed: Other (comment) PT Transfer Goal: Bed to Chair/Chair to Bed - Progress: Progressing toward goal  PT Treatment Precautions/Restrictions  Precautions Precautions: Fall Restrictions Weight Bearing Restrictions: No (Right AKA) Mobility (including Balance) Bed Mobility Bed Mobility: Yes Supine to Sit: 6: Modified independent (Device/Increase time) Sitting - Scoot to Edge of Bed: 6: Modified independent (Device/Increase time) Sit to Supine: 6: Modified independent (Device/Increase time) Scooting to [facility]: 6: Modified independent (Device/Increase time) Transfers Transfers: Yes Sit to Stand: Not tested (comment) Stand Pivot Transfers: Not tested (comment) Squat Pivot Transfers: Other (comment);With upper extremity assistance (min guard A  to/from equal-height surfaces) Squat Pivot Transfer Details (indicate cue type and reason): pt managed the full transfer using several scoot transfers and one smal squat transfer.  She is  unable to accomodate at this point to simulation differences with the chair and bed and therefore has difficulty trying to do a full squat pivot transfer not exactly like her home set up. Ambulation/Gait Ambulation/Gait: No Stairs: No  Posture/Postural Control Posture/Postural Control: No significant  limitations Balance Balance Assessed: Yes Static Sitting Balance Static Sitting - Balance Support: No upper extremity supported;Feet supported;Feet unsupported Static Sitting - Level of Assistance: 6: Modified independent (Device/Increase time) Dynamic Sitting Balance Dynamic Sitting - Balance Support: Left upper extremity supported;Right upper extremity supported;Feet supported;During functional activity Dynamic Sitting - Level of Assistance: 5: Stand by assistance Exercise    End of Session PT - End of Session Activity Tolerance: Patient tolerated treatment well Patient left: in bed;with call bell in reach Nurse Communication: Mobility status for transfers General Behavior During Session: [facility] for tasks performed Cognition: [facility] for tasks performed  Alcie Runions, Eliseo Gum 01/18/2012, 1:23 PM  01/18/2012  [place], PT [phone] (pager)

## 2012-01-18 NOTE — Progress Notes (Signed)
01/18/12 1740 Nursing Note: Pt to be discharged per MD's order. Pt received all discharge information. Pt verbalized understanding. IV and cardiac monitor discontinued per MD's order. Will escort pt to car safely. Lyan Holck Scientist, clinical (histocompatibility and immunogenetics).

## 2012-01-18 NOTE — Progress Notes (Signed)
01/18/12 1030 Spoke with pt.about HF education.  Pt. did have [facility] and pt. with understanding.  Pt. states that she has a church friend that comes M-F from 8am-5pm to assist her with ADL's.  She does not feel she needs [facility] RN.  States she may be discharged today. Tera Mater, RN, BSN [phone]

## 2012-01-19 ENCOUNTER — Other Ambulatory Visit: Payer: Medicare Other

## 2012-01-19 ENCOUNTER — Ambulatory Visit: Payer: Medicare Other | Admitting: Vascular Surgery

## 2012-01-20 LAB — CULTURE, BLOOD (ROUTINE X 2)
Culture  Setup Time: 201302091127
Culture: NO GROWTH

## 2012-02-22 ENCOUNTER — Ambulatory Visit: Payer: Medicare Other | Attending: Cardiovascular Disease | Admitting: Physical Therapy

## 2012-02-22 ENCOUNTER — Encounter: Payer: Medicare Other | Admitting: Physical Therapy

## 2012-02-22 DIAGNOSIS — R269 Unspecified abnormalities of gait and mobility: Secondary | ICD-10-CM | POA: Insufficient documentation

## 2012-02-22 DIAGNOSIS — M6281 Muscle weakness (generalized): Secondary | ICD-10-CM | POA: Insufficient documentation

## 2012-02-22 DIAGNOSIS — S78119A Complete traumatic amputation at level between unspecified hip and knee, initial encounter: Secondary | ICD-10-CM | POA: Insufficient documentation

## 2012-02-22 DIAGNOSIS — R5381 Other malaise: Secondary | ICD-10-CM | POA: Insufficient documentation

## 2012-02-22 DIAGNOSIS — IMO0001 Reserved for inherently not codable concepts without codable children: Secondary | ICD-10-CM | POA: Insufficient documentation

## 2012-02-28 ENCOUNTER — Ambulatory Visit: Payer: Medicare Other | Admitting: Physical Therapy

## 2012-03-01 ENCOUNTER — Ambulatory Visit: Payer: Medicare Other | Admitting: Physical Therapy

## 2012-03-06 ENCOUNTER — Ambulatory Visit: Payer: Medicare Other | Attending: Cardiovascular Disease | Admitting: Physical Therapy

## 2012-03-06 DIAGNOSIS — IMO0001 Reserved for inherently not codable concepts without codable children: Secondary | ICD-10-CM | POA: Insufficient documentation

## 2012-03-06 DIAGNOSIS — S78119A Complete traumatic amputation at level between unspecified hip and knee, initial encounter: Secondary | ICD-10-CM | POA: Insufficient documentation

## 2012-03-06 DIAGNOSIS — M6281 Muscle weakness (generalized): Secondary | ICD-10-CM | POA: Insufficient documentation

## 2012-03-06 DIAGNOSIS — R269 Unspecified abnormalities of gait and mobility: Secondary | ICD-10-CM | POA: Insufficient documentation

## 2012-03-06 DIAGNOSIS — R5381 Other malaise: Secondary | ICD-10-CM | POA: Insufficient documentation

## 2012-03-08 ENCOUNTER — Ambulatory Visit: Payer: Medicare Other | Admitting: Physical Therapy

## 2012-03-12 ENCOUNTER — Ambulatory Visit: Payer: Medicare Other | Admitting: Physical Therapy

## 2012-03-13 ENCOUNTER — Ambulatory Visit: Payer: Medicare Other | Admitting: Physical Therapy

## 2012-03-14 ENCOUNTER — Encounter: Payer: Medicare Other | Admitting: Physical Therapy

## 2012-03-19 ENCOUNTER — Encounter: Payer: Medicare Other | Admitting: Physical Therapy

## 2012-03-20 ENCOUNTER — Ambulatory Visit: Payer: Medicare Other | Admitting: Physical Therapy

## 2012-03-21 ENCOUNTER — Encounter: Payer: Medicare Other | Admitting: Physical Therapy

## 2012-03-22 ENCOUNTER — Ambulatory Visit: Payer: Medicare Other | Admitting: Physical Therapy

## 2012-03-27 ENCOUNTER — Ambulatory Visit: Payer: Medicare Other | Admitting: Physical Therapy

## 2012-03-29 ENCOUNTER — Ambulatory Visit: Payer: Medicare Other | Admitting: Physical Therapy

## 2012-04-03 ENCOUNTER — Ambulatory Visit: Payer: Medicare Other | Admitting: Physical Therapy

## 2012-04-05 ENCOUNTER — Ambulatory Visit: Payer: Medicare Other | Attending: Cardiovascular Disease | Admitting: Physical Therapy

## 2012-04-05 DIAGNOSIS — S78119A Complete traumatic amputation at level between unspecified hip and knee, initial encounter: Secondary | ICD-10-CM | POA: Insufficient documentation

## 2012-04-05 DIAGNOSIS — R269 Unspecified abnormalities of gait and mobility: Secondary | ICD-10-CM | POA: Insufficient documentation

## 2012-04-05 DIAGNOSIS — IMO0001 Reserved for inherently not codable concepts without codable children: Secondary | ICD-10-CM | POA: Insufficient documentation

## 2012-04-05 DIAGNOSIS — M6281 Muscle weakness (generalized): Secondary | ICD-10-CM | POA: Insufficient documentation

## 2012-04-05 DIAGNOSIS — R5381 Other malaise: Secondary | ICD-10-CM | POA: Insufficient documentation

## 2012-04-07 ENCOUNTER — Emergency Department (HOSPITAL_COMMUNITY): Payer: Medicare Other

## 2012-04-07 ENCOUNTER — Encounter (HOSPITAL_COMMUNITY): Payer: Self-pay | Admitting: Nurse Practitioner

## 2012-04-07 ENCOUNTER — Inpatient Hospital Stay (HOSPITAL_COMMUNITY)
Admission: EM | Admit: 2012-04-07 | Discharge: 2012-04-11 | DRG: 149 | Disposition: A | Discharge status: Home Health | Payer: Medicare Other | Source: Ambulatory Visit | Attending: Cardiovascular Disease | Admitting: Cardiovascular Disease

## 2012-04-07 DIAGNOSIS — I2510 Atherosclerotic heart disease of native coronary artery without angina pectoris: Secondary | ICD-10-CM | POA: Diagnosis present

## 2012-04-07 DIAGNOSIS — I5022 Chronic systolic (congestive) heart failure: Secondary | ICD-10-CM | POA: Diagnosis present

## 2012-04-07 DIAGNOSIS — Z8673 Personal history of transient ischemic attack (TIA), and cerebral infarction without residual deficits: Secondary | ICD-10-CM

## 2012-04-07 DIAGNOSIS — D509 Iron deficiency anemia, unspecified: Secondary | ICD-10-CM | POA: Diagnosis present

## 2012-04-07 DIAGNOSIS — I4891 Unspecified atrial fibrillation: Secondary | ICD-10-CM | POA: Diagnosis present

## 2012-04-07 DIAGNOSIS — K219 Gastro-esophageal reflux disease without esophagitis: Secondary | ICD-10-CM | POA: Diagnosis present

## 2012-04-07 DIAGNOSIS — Z86718 Personal history of other venous thrombosis and embolism: Secondary | ICD-10-CM

## 2012-04-07 DIAGNOSIS — S78119A Complete traumatic amputation at level between unspecified hip and knee, initial encounter: Secondary | ICD-10-CM

## 2012-04-07 DIAGNOSIS — E785 Hyperlipidemia, unspecified: Secondary | ICD-10-CM | POA: Diagnosis present

## 2012-04-07 DIAGNOSIS — M129 Arthropathy, unspecified: Secondary | ICD-10-CM | POA: Diagnosis present

## 2012-04-07 DIAGNOSIS — R42 Dizziness and giddiness: Principal | ICD-10-CM | POA: Diagnosis present

## 2012-04-07 DIAGNOSIS — Z79899 Other long term (current) drug therapy: Secondary | ICD-10-CM

## 2012-04-07 DIAGNOSIS — Z888 Allergy status to other drugs, medicaments and biological substances status: Secondary | ICD-10-CM

## 2012-04-07 DIAGNOSIS — G459 Transient cerebral ischemic attack, unspecified: Secondary | ICD-10-CM

## 2012-04-07 DIAGNOSIS — I252 Old myocardial infarction: Secondary | ICD-10-CM

## 2012-04-07 DIAGNOSIS — I10 Essential (primary) hypertension: Secondary | ICD-10-CM | POA: Diagnosis present

## 2012-04-07 DIAGNOSIS — I509 Heart failure, unspecified: Secondary | ICD-10-CM | POA: Diagnosis present

## 2012-04-07 LAB — CBC
Hemoglobin: 9.6 g/dL — ABNORMAL LOW (ref 12.0–15.0)
Platelets: 293 10*3/uL (ref 150–400)
RBC: 3.72 MIL/uL — ABNORMAL LOW (ref 3.87–5.11)

## 2012-04-07 LAB — URINALYSIS, ROUTINE W REFLEX MICROSCOPIC
Glucose, UA: NEGATIVE mg/dL
Ketones, ur: NEGATIVE mg/dL
Leukocytes, UA: NEGATIVE
Nitrite: NEGATIVE
Protein, ur: 100 mg/dL — AB

## 2012-04-07 LAB — BASIC METABOLIC PANEL
BUN: 13 mg/dL (ref 6–23)
Chloride: 101 mEq/L (ref 96–112)
GFR calc Af Amer: 56 mL/min — ABNORMAL LOW (ref >90)
GFR calc non Af Amer: 48 mL/min — ABNORMAL LOW (ref >90)
Glucose, Bld: 102 mg/dL — ABNORMAL HIGH (ref 70–99)
Potassium: 3.7 mEq/L (ref 3.5–5.1)
Sodium: 138 mEq/L (ref 135–145)

## 2012-04-07 LAB — DIFFERENTIAL
Basophils Relative: 0 % (ref 0–1)
Lymphs Abs: 1.9 10*3/uL (ref 0.7–4.0)
Monocytes Relative: 4 % (ref 3–12)
Neutro Abs: 6.6 10*3/uL (ref 1.7–7.7)
Neutrophils Relative %: 74 % (ref 43–77)

## 2012-04-07 LAB — GLUCOSE, CAPILLARY: Glucose-Capillary: 72 mg/dL (ref 70–99)

## 2012-04-07 LAB — POCT I-STAT TROPONIN I

## 2012-04-07 MED ORDER — SODIUM CHLORIDE 0.9 % IJ SOLN
3.0000 mL | Freq: Two times a day (BID) | INTRAMUSCULAR | Status: DC
  Administered 2012-04-07 – 2012-04-11 (×8): 3 mL via INTRAVENOUS

## 2012-04-07 MED ORDER — ALUM & MAG HYDROXIDE-SIMETH 200-200-20 MG/5ML PO SUSP: 30.0000 mL | Freq: Four times a day (QID) | ORAL | Status: DC | PRN

## 2012-04-07 MED ORDER — ADULT MULTIVITAMIN W/MINERALS CH
1.0000 | ORAL_TABLET | Freq: Every day | ORAL | Status: DC
  Administered 2012-04-08 – 2012-04-11 (×4): 1 via ORAL
  Filled 2012-04-07 (×5): qty 1

## 2012-04-07 MED ORDER — ONDANSETRON HCL 4 MG PO TABS: 4.0000 mg | ORAL_TABLET | Freq: Four times a day (QID) | ORAL | Status: DC | PRN

## 2012-04-07 MED ORDER — POTASSIUM CHLORIDE CRYS ER 10 MEQ PO TBCR
10.0000 meq | EXTENDED_RELEASE_TABLET | Freq: Every day | ORAL | Status: DC
  Administered 2012-04-08 – 2012-04-09 (×2): 10 meq via ORAL
  Filled 2012-04-07: qty 0.5
  Filled 2012-04-07: qty 1
  Filled 2012-04-07: qty 0.5

## 2012-04-07 MED ORDER — PANTOPRAZOLE SODIUM 40 MG PO TBEC
40.0000 mg | DELAYED_RELEASE_TABLET | Freq: Every day | ORAL | Status: DC
  Administered 2012-04-08 – 2012-04-11 (×4): 40 mg via ORAL
  Filled 2012-04-07 (×4): qty 1

## 2012-04-07 MED ORDER — AMIODARONE HCL 200 MG PO TABS
200.0000 mg | ORAL_TABLET | Freq: Every day | ORAL | Status: DC
  Administered 2012-04-08 – 2012-04-09 (×2): 200 mg via ORAL
  Filled 2012-04-07 (×4): qty 1

## 2012-04-07 MED ORDER — ONE-DAILY MULTI VITAMINS PO TABS
1.0000 | ORAL_TABLET | Freq: Every day | ORAL | Status: DC
  Filled 2012-04-07: qty 1

## 2012-04-07 MED ORDER — LEVOTHYROXINE SODIUM 100 MCG PO TABS
100.0000 ug | ORAL_TABLET | Freq: Every day | ORAL | Status: DC
Start: 2012-04-07 — End: 2012-04-11
  Administered 2012-04-08 – 2012-04-11 (×4): 100 ug via ORAL
  Filled 2012-04-07 (×6): qty 1

## 2012-04-07 MED ORDER — ATORVASTATIN CALCIUM 10 MG PO TABS
10.0000 mg | ORAL_TABLET | Freq: Every day | ORAL | Status: DC
  Administered 2012-04-07 – 2012-04-10 (×4): 10 mg via ORAL
  Filled 2012-04-07 (×5): qty 1

## 2012-04-07 MED ORDER — ACETAMINOPHEN 325 MG PO TABS: 650.0000 mg | ORAL_TABLET | Freq: Four times a day (QID) | ORAL | Status: DC | PRN

## 2012-04-07 MED ORDER — SODIUM CHLORIDE 0.9 % IJ SOLN: 3.0000 mL | INTRAMUSCULAR | Status: DC | PRN

## 2012-04-07 MED ORDER — LISINOPRIL 5 MG PO TABS
5.0000 mg | ORAL_TABLET | Freq: Every day | ORAL | Status: DC
  Administered 2012-04-08 – 2012-04-11 (×4): 5 mg via ORAL
  Filled 2012-04-07 (×5): qty 1

## 2012-04-07 MED ORDER — SODIUM CHLORIDE 0.9 % IV SOLN: 250.0000 mL | INTRAVENOUS | Status: DC | PRN

## 2012-04-07 MED ORDER — FUROSEMIDE 20 MG PO TABS
20.0000 mg | ORAL_TABLET | Freq: Every day | ORAL | Status: DC
  Administered 2012-04-08: 20 mg via ORAL
  Filled 2012-04-07 (×3): qty 1

## 2012-04-07 MED ORDER — ONDANSETRON HCL 4 MG/2ML IJ SOLN: 4.0000 mg | Freq: Four times a day (QID) | INTRAMUSCULAR | Status: DC | PRN

## 2012-04-07 MED ORDER — SODIUM CHLORIDE 0.9 % IJ SOLN: 3.0000 mL | Freq: Two times a day (BID) | INTRAMUSCULAR | Status: DC

## 2012-04-07 MED ORDER — ACETAMINOPHEN 650 MG RE SUPP: 650.0000 mg | Freq: Four times a day (QID) | RECTAL | Status: DC | PRN

## 2012-04-07 MED ORDER — DOCUSATE SODIUM 100 MG PO CAPS
100.0000 mg | ORAL_CAPSULE | Freq: Two times a day (BID) | ORAL | Status: DC
  Administered 2012-04-08 – 2012-04-11 (×7): 100 mg via ORAL
  Filled 2012-04-07 (×9): qty 1

## 2012-04-07 NOTE — ED Notes (Signed)
Patient tried to urinate w/o any success.  Patient aware of the need for specimen

## 2012-04-07 NOTE — ED Notes (Signed)
Patient transported to MRI 

## 2012-04-07 NOTE — ED Notes (Signed)
Per ems: pt reports she was sitting in wheelchair after breakfast and felt dizzy. No loc. A&Ox4 en route. No pain. No neuro deficits. R AKA.

## 2012-04-07 NOTE — H&P (Signed)
[name] is an 78 y.o. female.   Chief Complaint: dizziness HPI: 78 years old black female with one day history of dizziness without palpitation or gastrointestinal bleed or fever.  Past Medical History  Diagnosis Date  . Coronary artery disease   . Hypertension   . Myocardial infarction   . Hyperlipidemia   . Atrial fibrillation   . Stroke   . DVT (deep venous thrombosis)   . Mesenteric ischemia   . Arthritis   . Weight loss   . Blood clot in vein   . Shortness of breath   . Anemia   . GERD (gastroesophageal reflux disease)       Past Surgical History  Procedure Date  . Abdominal hysterectomy 1964  . Thrombectomy of superior mesenteric artery   . Abdominal aortogram   . Cholecystectomy   . Rt aka     Family History  Problem Relation Age of Onset  . Cancer Brother   . Diabetes Paternal Aunt    Social History:  reports that she has never smoked. She quit smokeless tobacco use about 43 years ago. Her smokeless tobacco use included Snuff. She reports that she does not drink alcohol or use illicit drugs.  Allergies:  Allergies  Allergen Reactions  . Morphine And Related Anaphylaxis  . Penicillins Other (See Comments)    Makes patient sick     (Not in a hospital admission)  Results for orders placed during the hospital encounter of 04/07/12 (from the past 48 hour(s))  GLUCOSE, CAPILLARY     Status: Normal   Collection Time   04/07/12 12:06 PM      Component Value Range Comment   Glucose-Capillary 72  70 - 99 (mg/dL)   CBC     Status: Abnormal   Collection Time   04/07/12 12:37 PM      Component Value Range Comment   WBC 9.0  4.0 - 10.5 (K/uL)    RBC 3.72 (*) 3.87 - 5.11 (MIL/uL)    Hemoglobin 9.6 (*) 12.0 - 15.0 (g/dL)    HCT 19.1 (*) 47.8 - 46.0 (%)    MCV 75.0 (*) 78.0 - 100.0 (fL)    MCH 25.8 (*) 26.0 - 34.0 (pg)    MCHC 34.4  30.0 - 36.0 (g/dL)    RDW 29.5 (*) 62.1 - 15.5 (%)    Platelets 293  150 - 400 (K/uL)   DIFFERENTIAL     Status: Normal     Collection Time   04/07/12 12:37 PM      Component Value Range Comment   Neutrophils Relative 74  43 - 77 (%)    Neutro Abs 6.6  1.7 - 7.7 (K/uL)    Lymphocytes Relative 21  12 - 46 (%)    Lymphs Abs 1.9  0.7 - 4.0 (K/uL)    Monocytes Relative 4  3 - 12 (%)    Monocytes Absolute 0.4  0.1 - 1.0 (K/uL)    Eosinophils Relative 1  0 - 5 (%)    Eosinophils Absolute 0.1  0.0 - 0.7 (K/uL)    Basophils Relative 0  0 - 1 (%)    Basophils Absolute 0.0  0.0 - 0.1 (K/uL)   BASIC METABOLIC PANEL     Status: Abnormal   Collection Time   04/07/12 12:37 PM      Component Value Range Comment   Sodium 138  135 - 145 (mEq/L)    Potassium 3.7  3.5 - 5.1 (mEq/L)  Chloride 101  96 - 112 (mEq/L)    CO2 26  19 - 32 (mEq/L)    Glucose, Bld 102 (*) 70 - 99 (mg/dL)    BUN 13  6 - 23 (mg/dL)    Creatinine, Ser 1.61  0.50 - 1.10 (mg/dL)    Calcium 9.2  8.4 - 10.5 (mg/dL)    GFR calc non Af Amer 48 (*) >90 (mL/min)    GFR calc Af Amer 56 (*) >90 (mL/min)   POCT I-STAT TROPONIN I     Status: Normal   Collection Time   04/07/12 12:59 PM      Component Value Range Comment   Troponin i, poc 0.05  0.00 - 0.08 (ng/mL)    Comment 3             Dg Chest 2 View  04/07/2012  *RADIOLOGY REPORT*  Clinical Data: Dizziness and hypertension.  CHEST - 2 VIEW  Comparison: 01/24/2012.  Findings: The heart is enlarged but stable.  There is vascular congestion and mild interstitial edema.  No definite pleural effusions or focal infiltrates.  The bony structures are stable.  IMPRESSION: Cardiac enlargement with vascular congestion and mild interstitial edema.  Original Report Authenticated By: P. Loralie Champagne, M.D.   Ct Head Wo Contrast  04/07/2012  *RADIOLOGY REPORT*  Clinical Data: Dizziness.  History of coronary artery disease with high, atrial fibrillation, and stroke.  CT HEAD WITHOUT CONTRAST 04/07/2012:  Technique:  Contiguous axial images were obtained from the base of the skull through the vertex without contrast.   Comparison: Unenhanced cranial CT 12/11/2007.  Findings: Ventricular system normal in size and appearance for age. Mild to moderate cortical atrophy and mild cerebellar atrophy, unchanged.  Moderate changes of small vessel disease of the white matter diffusely, unchanged.  No mass lesion.  No midline shift. No acute hemorrhage or hematoma.  No extra-axial fluid collections. No evidence of acute infarction.  No significant interval change.  No focal osseous abnormality involving the skull.  Visualized paranasal sinuses, mastoid air cells, and middle ear cavities well- aerated.  Extensive bilateral carotid siphon and vertebral artery atherosclerosis.  IMPRESSION:  1.  No acute intracranial abnormality. 2.  Stable mild to moderate cortical atrophy, mild cerebellar atrophy, and moderate chronic microvascular ischemic changes of the white matter. 3.  Extensive bilateral carotid siphon and vertebral artery atherosclerosis.  Original Report Authenticated By: Arnell Sieving, M.D.    @ROS @ No weight gain, wears glasses, + cataract surgery, + dentures. No hearing loss, + Tinnitus, No asthma, + chest pain, No palpitations, + leg edema. + right AKA, + diarrhea, + constipation, No hepatitis, + CVA, No seizures, No psych admission.  Physical Exam: Blood pressure 136/88, pulse 47, temperature 97.5 F (36.4 C), temperature source Oral, resp. rate 14, SpO2 100.00%. HEENT: The patient is normocephalic and atraumatic, with brown eyes. Pupils equal, round, and reactive to light. Conjunctiva pale pink. Sclerae muddy. Mucous membranes pink and moist.  NECK: + JVD, no carotid bruits.  LUNGS: Basal crackles, bilaterally.  HEART: Normal S1 and S2. A grade 2/6 systolic murmur.  ABDOMEN: Soft, non- tender.  EXTREMITIES: 2 + edema. No cyanosis or clubbing. Right AKA.  CNS: Cranial nerves grossly intact. Bilateral equal grips  SKIN: Warm and dry.   Assessment/Plan Dizziness Chronic left heart systolic failure Atrial  fibrillation to Sinus rhythm Hypertension Right AKA Anemia/Iron deficiency  Plan: Telemetry, Oxygen and home medications.  Anothony Bursch S 04/07/2012, 4:04 PM

## 2012-04-07 NOTE — ED Notes (Signed)
cbg 72

## 2012-04-07 NOTE — ED Provider Notes (Signed)
History     CSN: [id number]  Arrival date & time 04/07/12  1159   First MD Initiated Contact with Patient 04/07/12 1201      Chief Complaint  Patient presents with  . Dizziness    (Consider location/radiation/quality/duration/timing/severity/associated sxs/prior treatment) HPI Comments: Patient comes in today via EMS with a chief complaint of dizziness.  She describes the dizziness as both a feeling like the room is spinning and a feeling of lightheadedness.  She reports that the dizziness lasted approximately 30 minutes and then resolved.  She is denying any dizziness at this time.  She reports that she was sitting in her wheelchair watching TV when she began having the dizziness.  Denies syncope. Denies any changes in vision.  Denies any chest pain or SOB.  Denies fever or chills.  PMH significant for a fib, MI, CAD, CVA, DVT, and peripheral artery disease.  She is currently not taking any anticoagulation therapy.  She lives at home with her son and daughter-in-law.  Her PCP is Dr. Algie Coffer.  The history is provided by the patient (son and daughter in law).    Past Medical History  Diagnosis Date  . Coronary artery disease   . Hypertension   . Myocardial infarction   . Hyperlipidemia   . Atrial fibrillation   . Stroke   . DVT (deep venous thrombosis)   . Mesenteric ischemia   . Arthritis   . Weight loss   . Blood clot in vein   . Shortness of breath   . Anemia   . GERD (gastroesophageal reflux disease)     Past Surgical History  Procedure Date  . Abdominal hysterectomy 1964  . Thrombectomy of superior mesenteric artery   . Abdominal aortogram   . Cholecystectomy   . Rt aka     Family History  Problem Relation Age of Onset  . Cancer Brother   . Diabetes Paternal Aunt     History  Substance Use Topics  . Smoking status: Never Smoker   . Smokeless tobacco: Former Neurosurgeon    Types: Snuff    Quit date: 05/31/1968  . Alcohol Use: No    OB History    Grav Para  Term Preterm Abortions TAB SAB Ect Mult Living                  Review of Systems  Constitutional: Negative for fever and chills.  HENT: Negative for neck pain and neck stiffness.   Eyes: Negative for pain and visual disturbance.  Respiratory: Positive for cough. Negative for chest tightness and shortness of breath.   Cardiovascular: Negative for chest pain and palpitations.  Gastrointestinal: Negative for nausea, vomiting, abdominal pain and anal bleeding.  Genitourinary: Negative for dysuria.  Musculoskeletal:       Wheelchair bound  Skin: Negative for color change.  Neurological: Positive for dizziness and light-headedness. Negative for syncope, facial asymmetry, speech difficulty, weakness, numbness and headaches.  Psychiatric/Behavioral: Negative for confusion.    Allergies  Morphine and related and Penicillins  Home Medications   Current Outpatient Rx  Name Route Sig Dispense Refill  . AMIODARONE HCL 200 MG PO TABS Oral Take 200 mg by mouth daily.    . ATORVASTATIN CALCIUM 10 MG PO TABS Oral Take 10 mg by mouth daily.      Marland Kitchen COENZYME Q10 30 MG PO CAPS Oral Take 100 mg by mouth 3 (three) times daily.      Marland Kitchen DOCUSATE SODIUM 100 MG PO CAPS Oral  Take 100 mg by mouth daily as needed. For constipation    . FERROUS GLUCONATE IRON 246 (28 FE) MG PO TABS Oral Take 65 mg by mouth daily with breakfast.      . FUROSEMIDE 20 MG PO TABS Oral Take 20 mg by mouth daily.     Marland Kitchen LEVOTHYROXINE SODIUM 100 MCG PO TABS Oral Take 1 tablet (100 mcg total) by mouth daily before breakfast. 30 tablet 3  . LISINOPRIL 5 MG PO TABS Oral Take 1 tablet (5 mg total) by mouth daily. 30 tablet 1  . ONE-DAILY MULTI VITAMINS PO TABS Oral Take 1 tablet by mouth daily.      Marland Kitchen OMEPRAZOLE 20 MG PO CPDR Oral Take 20 mg by mouth daily.      Marland Kitchen OVER THE COUNTER MEDICATION Oral Take 30 mLs by mouth every 4 (four) hours as needed. For cough  Adult multi-symptom cold medicine    . POTASSIUM CHLORIDE CRYS ER 10 MEQ PO  TBCR Oral Take 10 mEq by mouth daily.      Marland Kitchen RISEDRONATE SODIUM 35 MG PO TABS Oral Take 35 mg by mouth every 7 (seven) days. with water on empty stomach, nothing by mouth or lie down for next 30 minutes.       BP 151/113  Pulse 62  Temp(Src) 97.5 F (36.4 C) (Oral)  Resp 16  SpO2 100%  Physical Exam  Nursing note and vitals reviewed. Constitutional: She appears well-developed and well-nourished. No distress.  HENT:  Head: Normocephalic and atraumatic.  Mouth/Throat: Oropharynx is clear and moist.  Eyes: EOM are normal. Pupils are equal, round, and reactive to light.  Neck: Normal range of motion. Neck supple.  Cardiovascular: Normal rate and regular rhythm.   Murmur heard. Pulmonary/Chest: Effort normal and breath sounds normal. No respiratory distress. She has no wheezes. She has no rales.  Abdominal: Soft. There is no tenderness.  Neurological: She is alert. She has normal strength. No cranial nerve deficit or sensory deficit. Coordination normal.       Unable to assess gait Right leg AKA  Skin: Skin is warm and dry. She is not diaphoretic.  Psychiatric: She has a normal mood and affect.    ED Course  Procedures (including critical care time)  Labs Reviewed - No data to display No results found.   1. TIA (transient ischemic attack)     2:59 PM Discussed patient with Dr. Algie Coffer.  He reports that he will come see patient in the ED and admit.     Date: 04/07/2012  Rate: 50  Rhythm: sinus bradycardia  QRS Axis: normal  Intervals: PR prolonged  ST/T Wave abnormalities: normal  Conduction Disutrbances:first-degree A-V block   Narrative Interpretation:   Old EKG Reviewed: changes noted.  Previous EKG showed a fib which is not present on EKG today    MDM  Patient with symptoms concerning for TIA.  Patient with a history of A fib currently not on anticoagulation therapy.  EKG today does not show a fib.  Labs unremarkable.  Labs show anemia, but hemoglobin consistent  with past hemoglobins.  CT head did not show any acute abnormalities.  MRI and MRA pending.  Patient admitted to hospital for further monitoring and work up of TIA.        Pascal Lux [place], PA-C 04/08/12 2102

## 2012-04-08 ENCOUNTER — Encounter (HOSPITAL_COMMUNITY): Payer: Self-pay | Admitting: *Deleted

## 2012-04-08 LAB — CBC
MCH: 25.3 pg — ABNORMAL LOW (ref 26.0–34.0)
MCV: 74.4 fL — ABNORMAL LOW (ref 78.0–100.0)
Platelets: 293 10*3/uL (ref 150–400)
RBC: 3.6 MIL/uL — ABNORMAL LOW (ref 3.87–5.11)

## 2012-04-08 LAB — BASIC METABOLIC PANEL
BUN: 14 mg/dL (ref 6–23)
CO2: 27 mEq/L (ref 19–32)
Calcium: 9.2 mg/dL (ref 8.4–10.5)
Glucose, Bld: 89 mg/dL (ref 70–99)
Sodium: 138 mEq/L (ref 135–145)

## 2012-04-08 MED ORDER — FERROUS GLUCONATE 324 (38 FE) MG PO TABS
324.0000 mg | ORAL_TABLET | Freq: Every day | ORAL | Status: DC
  Administered 2012-04-08 – 2012-04-11 (×4): 324 mg via ORAL
  Filled 2012-04-08 (×6): qty 1

## 2012-04-08 NOTE — Progress Notes (Signed)
1700 pt with prosthesis on and b/p taken while sitting 178/89. Up and ambulated from bed to door with standby assist and walker pt. Working hard to ambulate broke out in a sweat in w/c and returned to bed. B/p 167/99 b/p much higher in evening time than in am. Noted all b/p meds are taken in am at one time. Pt. Reports she used to take her lisinopril at noon but after last hospitalization was not instructed to take it that way anymore. Instructed pt. MD would advise pt. On b/p meds in am and discuss with pt. Any med changes.

## 2012-04-08 NOTE — Progress Notes (Signed)
Subjective:  Feeling better.  Objective:  Vital Signs in the last 24 hours: Temp:  [97.5 F (36.4 C)-99.1 F (37.3 C)] 98.6 F (37 C) (05/05 0800) Pulse Rate:  [47-63] 63  (05/05 0800) Cardiac Rhythm:  [-] Normal sinus rhythm;Heart block (05/05 0816) Resp:  [12-18] 18  (05/05 0800) BP: (119-151)/(58-113) 144/76 mmHg (05/05 0800) SpO2:  [97 %-100 %] 99 % (05/05 0800) Weight:  [66.316 kg (146 lb 3.2 oz)] 66.316 kg (146 lb 3.2 oz) (05/04 1800)  Physical Exam: BP Readings from Last 1 Encounters:  04/08/12 144/76    Wt Readings from Last 1 Encounters:  04/07/12 66.316 kg (146 lb 3.2 oz)    Weight change:   HEENT: [place]/AT, Eyes-Brown, PERL, EOMI, Conjunctiva-Pale pink, Sclera-Non-icteric Neck: No JVD, No bruit, Trachea midline. Lungs:  Clear, Bilateral. Cardiac:  Regular rhythm, normal S1 and S2, no S3.  Abdomen:  Soft, non-tender. Extremities:  No edema present. No cyanosis. No clubbing. Right AKA. CNS: AxOx3, Cranial nerves grossly intact, moves all 4 extremities. Right handed. Skin: Warm and dry.   Intake/Output from previous day: 05/04 0701 - 05/05 0700 In: 3 [I.V.:3] Out: -     Lab Results: BMET    Component Value Date/Time   NA 138 04/08/2012 0819   K 3.8 04/08/2012 0819   CL 102 04/08/2012 0819   CO2 27 04/08/2012 0819   GLUCOSE 89 04/08/2012 0819   BUN 14 04/08/2012 0819   CREATININE 1.05 04/08/2012 0819   CALCIUM 9.2 04/08/2012 0819   GFRNONAA 47* 04/08/2012 0819   GFRAA 55* 04/08/2012 0819   CBC    Component Value Date/Time   WBC 11.7* 04/08/2012 0819   RBC 3.60* 04/08/2012 0819   HGB 9.1* 04/08/2012 0819   HCT 26.8* 04/08/2012 0819   PLT 293 04/08/2012 0819   MCV 74.4* 04/08/2012 0819   MCH 25.3* 04/08/2012 0819   MCHC 34.0 04/08/2012 0819   RDW 16.5* 04/08/2012 0819   LYMPHSABS 1.9 04/07/2012 1237   MONOABS 0.4 04/07/2012 1237   EOSABS 0.1 04/07/2012 1237   BASOSABS 0.0 04/07/2012 1237   CARDIAC ENZYMES Lab Results  Component Value Date   CKTOTAL 69 01/12/2012   CKMB 2.2 01/12/2012   TROPONINI <0.30 01/12/2012    Assessment/Plan:  Patient Active Hospital Problem List: Dizziness  Chronic left heart systolic failure  Atrial fibrillation to Sinus rhythm  Hypertension  Right AKA  Anemia/Iron deficiency  Increase activity   LOS: 1 day    Orpah Cobb  MD  04/08/2012, 10:07 AM

## 2012-04-09 MED ORDER — FUROSEMIDE 40 MG PO TABS
40.0000 mg | ORAL_TABLET | Freq: Two times a day (BID) | ORAL | Status: DC
  Administered 2012-04-09 – 2012-04-10 (×2): 40 mg via ORAL
  Filled 2012-04-09 (×5): qty 1

## 2012-04-09 NOTE — Progress Notes (Signed)
Pts BP 119/67, HR 60; pt then ambulated pt 49ft in her room, pt became diaphoretic; pt sat down and re-checked BP 145/78, HR 90s; will continue to monitor

## 2012-04-09 NOTE — Progress Notes (Signed)
Pts BP 94/70 manually, HR SB 59; pt denies dizzyness or lightheadedness; Dr.Kadakia paged and made aware, told to hold tonights dose of lasix; will continue to monitor

## 2012-04-09 NOTE — Progress Notes (Signed)
Subjective:  Resting comfortably. Short of breath with activity.  Objective:  Vital Signs in the last 24 hours: Temp:  [97.8 F (36.6 C)-99.4 F (37.4 C)] 99 F (37.2 C) (05/06 0800) Pulse Rate:  [54-65] 58  (05/06 0800) Cardiac Rhythm:  [-] Normal sinus rhythm;Heart block (05/06 0828) Resp:  [14-18] 17  (05/06 0800) BP: (105-178)/(63-99) 137/75 mmHg (05/06 0800) SpO2:  [96 %-100 %] 96 % (05/06 0800)  Physical Exam: BP Readings from Last 1 Encounters:  04/09/12 137/75    Wt Readings from Last 1 Encounters:  04/07/12 66.316 kg (146 lb 3.2 oz)    Weight change:   HEENT: [place]/AT, Eyes-Brown, PERL, EOMI, Conjunctiva-Pale pink, Sclera-Non-icteric Neck: + JVD, No bruit, Trachea midline. Lungs:  Crackles at bases, Bilateral. Cardiac:  Regular rhythm, normal S1 and S2, no S3.  Abdomen:  Soft, non-tender. Extremities:  Left ankle edema present. No cyanosis. No clubbing. Right AKA. CNS: AxOx3, Cranial nerves grossly intact, moves all 4 extremities. Right handed. Skin: Warm and dry.   Intake/Output from previous day: 05/05 0701 - 05/06 0700 In: 843 [P.O.:840; I.V.:3] Out: -     Lab Results: BMET    Component Value Date/Time   NA 138 04/08/2012 0819   K 3.8 04/08/2012 0819   CL 102 04/08/2012 0819   CO2 27 04/08/2012 0819   GLUCOSE 89 04/08/2012 0819   BUN 14 04/08/2012 0819   CREATININE 1.05 04/08/2012 0819   CALCIUM 9.2 04/08/2012 0819   GFRNONAA 47* 04/08/2012 0819   GFRAA 55* 04/08/2012 0819   CBC    Component Value Date/Time   WBC 11.7* 04/08/2012 0819   RBC 3.60* 04/08/2012 0819   HGB 9.1* 04/08/2012 0819   HCT 26.8* 04/08/2012 0819   PLT 293 04/08/2012 0819   MCV 74.4* 04/08/2012 0819   MCH 25.3* 04/08/2012 0819   MCHC 34.0 04/08/2012 0819   RDW 16.5* 04/08/2012 0819   LYMPHSABS 1.9 04/07/2012 1237   MONOABS 0.4 04/07/2012 1237   EOSABS 0.1 04/07/2012 1237   BASOSABS 0.0 04/07/2012 1237   CARDIAC ENZYMES Lab Results  Component Value Date   CKTOTAL 69 01/12/2012   CKMB 2.2 01/12/2012   TROPONINI  <0.30 01/12/2012    Assessment/Plan:  Patient Active Hospital Problem List:  Dizziness  Chronic left heart systolic failure  Atrial fibrillation to Sinus rhythm  Hypertension  Right AKA  Anemia/Iron deficiency   Increase lasix.   LOS: 2 days    Orpah Cobb  MD  04/09/2012, 9:22 AM

## 2012-04-09 NOTE — ED Provider Notes (Signed)
Medical screening examination/treatment/procedure(s) were conducted as a shared visit with non-physician practitioner(s) and myself.  I personally evaluated the patient during the encounter Pt with sx concerning for TIA in the cerebellum with new onset dizziness which has now resolved.  Pt has a.fib but not anticoagulated.  Will have her admitted for further treatment.  Gwyneth Sprout, MD 04/09/12 2142

## 2012-04-10 ENCOUNTER — Encounter: Payer: Medicare Other | Admitting: Physical Therapy

## 2012-04-10 LAB — CBC
HCT: 25.8 % — ABNORMAL LOW (ref 36.0–46.0)
MCHC: 34.1 g/dL (ref 30.0–36.0)
MCV: 74.4 fL — ABNORMAL LOW (ref 78.0–100.0)
RDW: 16.6 % — ABNORMAL HIGH (ref 11.5–15.5)

## 2012-04-10 LAB — COMPREHENSIVE METABOLIC PANEL
Albumin: 2.5 g/dL — ABNORMAL LOW (ref 3.5–5.2)
BUN: 17 mg/dL (ref 6–23)
Creatinine, Ser: 1.17 mg/dL — ABNORMAL HIGH (ref 0.50–1.10)
Potassium: 4.1 mEq/L (ref 3.5–5.1)
Total Protein: 7.5 g/dL (ref 6.0–8.3)

## 2012-04-10 MED ORDER — AMIODARONE HCL 100 MG PO TABS
100.0000 mg | ORAL_TABLET | Freq: Every day | ORAL | Status: DC
  Administered 2012-04-10 – 2012-04-11 (×2): 100 mg via ORAL
  Filled 2012-04-10 (×2): qty 1

## 2012-04-10 MED ORDER — FUROSEMIDE 40 MG PO TABS
40.0000 mg | ORAL_TABLET | Freq: Every day | ORAL | Status: DC
  Administered 2012-04-11: 40 mg via ORAL
  Filled 2012-04-10: qty 1

## 2012-04-10 MED ORDER — POTASSIUM CHLORIDE CRYS ER 10 MEQ PO TBCR
10.0000 meq | EXTENDED_RELEASE_TABLET | ORAL | Status: DC
  Administered 2012-04-11: 10 meq via ORAL
  Filled 2012-04-10: qty 1

## 2012-04-10 NOTE — Progress Notes (Signed)
Subjective:  Breathing better. Good diuresis.  Objective:  Vital Signs in the last 24 hours: Temp:  [98 F (36.7 C)-99.7 F (37.6 C)] 98.6 F (37 C) (05/07 0738) Pulse Rate:  [50-90] 50  (05/07 0738) Cardiac Rhythm:  [-] Normal sinus rhythm;Heart block (05/07 0800) Resp:  [16] 16  (05/07 0738) BP: (94-145)/(67-78) 143/71 mmHg (05/07 0738) SpO2:  [97 %-99 %] 98 % (05/07 0738)  Physical Exam: BP Readings from Last 1 Encounters:  04/10/12 143/71    Wt Readings from Last 1 Encounters:  04/07/12 66.316 kg (146 lb 3.2 oz)    Weight change:   HEENT: [place]/AT, Eyes-Brown, PERL, EOMI, Conjunctiva-Pale pink, Sclera-Non-icteric Neck: + JVD, No bruit, Trachea midline. Lungs:  Clearing, Bilateral. Cardiac:  Regular rhythm, normal S1 and S2, no S3.  Abdomen:  Soft, non-tender. Extremities:  Trace edema present. No cyanosis. No clubbing. Right AKA CNS: AxOx3, Cranial nerves grossly intact, moves all 4 extremities. Right handed. Skin: Warm and dry.   Intake/Output from previous day: 05/06 0701 - 05/07 0700 In: 600 [P.O.:600] Out: 1401 [Urine:1400; Stool:1]    Lab Results: BMET    Component Value Date/Time   NA 139 04/10/2012 0533   K 4.1 04/10/2012 0533   CL 102 04/10/2012 0533   CO2 30 04/10/2012 0533   GLUCOSE 85 04/10/2012 0533   BUN 17 04/10/2012 0533   CREATININE 1.17* 04/10/2012 0533   CALCIUM 8.9 04/10/2012 0533   GFRNONAA 41* 04/10/2012 0533   GFRAA 48* 04/10/2012 0533   CBC    Component Value Date/Time   WBC 11.4* 04/10/2012 0533   RBC 3.47* 04/10/2012 0533   HGB 8.8* 04/10/2012 0533   HCT 25.8* 04/10/2012 0533   PLT 278 04/10/2012 0533   MCV 74.4* 04/10/2012 0533   MCH 25.4* 04/10/2012 0533   MCHC 34.1 04/10/2012 0533   RDW 16.6* 04/10/2012 0533   LYMPHSABS 1.9 04/07/2012 1237   MONOABS 0.4 04/07/2012 1237   EOSABS 0.1 04/07/2012 1237   BASOSABS 0.0 04/07/2012 1237   CARDIAC ENZYMES Lab Results  Component Value Date   CKTOTAL 69 01/12/2012   CKMB 2.2 01/12/2012   TROPONINI <0.30 01/12/2012     Assessment/Plan:  Patient Active Hospital Problem List:  Dizziness  Chronic left heart systolic failure  Atrial fibrillation to Sinus rhythm  Hypertension  Right AKA  Anemia/Iron deficiency    Continue medical treatment.  Decrease amiodarone by 50 %.   LOS: 3 days    Orpah Cobb  MD  04/10/2012, 9:32 AM

## 2012-04-11 MED ORDER — AMIODARONE HCL 200 MG PO TABS: 100.0000 mg | ORAL_TABLET | Freq: Every day | ORAL | Status: DC

## 2012-04-11 MED ORDER — FUROSEMIDE 20 MG PO TABS: 40.0000 mg | ORAL_TABLET | Freq: Every day | ORAL | Status: DC

## 2012-04-11 NOTE — Progress Notes (Signed)
D/c orders received;IV removed with gauze on, pt remains in stable condition, pt meds and instructions reviewed and given to pt; pt d/c to home 

## 2012-04-11 NOTE — Discharge Summary (Signed)
Physician Discharge Summary  Patient ID: [name] MRN: [id number] DOB/AGE: [birth year] 78 y.o.  Admit date: 04/07/2012 Discharge date: 04/11/2012  Admission Diagnoses: Dizziness  Chronic left heart systolic failure  Atrial fibrillation to Sinus rhythm  Hypertension  Right AKA  Anemia/Iron deficiency   Discharge Diagnoses:  Principal Problem:  *Dizziness Chronic left heart systolic failure  Atrial fibrillation to Sinus rhythm  Hypertension  Right AKA  Anemia/Iron deficiency    Discharged Condition: good  Hospital Course: 78 years old black female presented with dizziness and exertional dyspnea. No chest pain or fever.  Consults: cardiology  Significant Diagnostic Studies: labs: Normal WBC and platelets count. Mild anemia with Hgb of 9.6. Normal electrolytes.  Treatments: cardiac meds: furosemide  Discharge Exam: Blood pressure 124/66, pulse 54, temperature 99 F (37.2 C), temperature source Oral, resp. rate 18, height 5\' 6"  (1.676 m), weight 66.316 kg (146 lb 3.2 oz), SpO2 99.00%. HEENT: The patient is normocephalic and atraumatic, with brown eyes. Pupils equal, round, and reactive to light. Conjunctiva pale pink. Sclerae muddy. Mucous membranes pink and moist.  NECK: + JVD, no carotid bruits.  LUNGS: Basal crackles, bilaterally.  HEART: Normal S1 and S2. A grade 2/6 systolic murmur.  ABDOMEN: Soft, non- tender.  EXTREMITIES:1 + edema. No cyanosis or clubbing. Right AKA.  CNS: Cranial nerves grossly intact. Bilateral equal grips  SKIN: Warm and dry.   Disposition: 06-Home-Health Care Svc  Discharge Orders    Future Appointments: Provider: Department: Dept Phone: Center:   04/12/2012 9:45 AM Fraser Din, PT [facility] [phone] [facility]   04/17/2012 10:30 AM Fraser Din, PT [facility] [phone] [facility]   04/19/2012 9:45 AM Fraser Din, PT [facility] [phone] [facility]   04/24/2012 10:30 AM Fraser Din, PT [facility] [phone] [facility]     04/26/2012 10:30 AM Fraser Din, PT [facility] [phone] [facility]   05/01/2012 10:30 AM Fraser Din, PT [facility] [phone] [facility]   05/03/2012 10:30 AM Fraser Din, PT [facility] [phone] [facility]   05/08/2012 10:30 AM Fraser Din, PT [facility] [phone] [facility]   05/10/2012 10:30 AM Fraser Din, PT [facility] [phone] [facility]     Medication List  As of 04/11/2012 11:11 AM   TAKE these medications         amiodarone 200 MG tablet   Commonly known as: PACERONE   Take 0.5 tablets (100 mg total) by mouth daily.      atorvastatin 10 MG tablet   Commonly known as: LIPITOR   Take 10 mg by mouth daily.      co-enzyme Q-10 30 MG capsule   Take 100 mg by mouth 3 (three) times daily.      docusate sodium 100 MG capsule   Commonly known as: COLACE   Take 100 mg by mouth daily as needed. For constipation      ferrous gluconate 246 (28 FE) MG tablet   Commonly known as: FERGON   Take 65 mg by mouth daily with breakfast.      furosemide 20 MG tablet   Commonly known as: LASIX   Take 2 tablets (40 mg total) by mouth daily.      levothyroxine 100 MCG tablet   Commonly known as: SYNTHROID, LEVOTHROID   Take 1 tablet (100 mcg total) by mouth daily before breakfast.      lisinopril 5 MG tablet   Commonly known as: PRINIVIL,ZESTRIL   Take 1 tablet (5 mg total) by  mouth daily.      multivitamin tablet   Take 1 tablet by mouth daily.      omeprazole 20 MG capsule   Commonly known as: PRILOSEC   Take 20 mg by mouth daily.      OVER THE COUNTER MEDICATION   Take 30 mLs by mouth every 4 (four) hours as needed. For cough  Adult multi-symptom cold medicine      potassium chloride 10 MEQ tablet   Commonly known as: K-DUR,KLOR-CON   Take 10 mEq by mouth daily.      risedronate 35 MG tablet   Commonly known as: ACTONEL   Take 35 mg by mouth every 7 (seven) days. with water on empty stomach, nothing by mouth or lie down for next 30 minutes.              SignedOrpah Cobb S 04/11/2012, 11:11 AM

## 2012-04-12 ENCOUNTER — Ambulatory Visit: Payer: Medicare Other | Admitting: Physical Therapy

## 2012-04-17 ENCOUNTER — Ambulatory Visit: Payer: Medicare Other | Admitting: Physical Therapy

## 2012-04-19 ENCOUNTER — Ambulatory Visit: Payer: Medicare Other | Admitting: Physical Therapy

## 2012-04-24 ENCOUNTER — Ambulatory Visit: Payer: Medicare Other | Admitting: Physical Therapy

## 2012-04-26 ENCOUNTER — Ambulatory Visit: Payer: Medicare Other | Admitting: *Deleted

## 2012-05-01 ENCOUNTER — Ambulatory Visit: Payer: Medicare Other | Admitting: *Deleted

## 2012-05-03 ENCOUNTER — Ambulatory Visit: Payer: Medicare Other | Admitting: *Deleted

## 2012-05-08 ENCOUNTER — Ambulatory Visit: Payer: Medicare Other | Attending: Cardiovascular Disease | Admitting: Physical Therapy

## 2012-05-08 DIAGNOSIS — S78119A Complete traumatic amputation at level between unspecified hip and knee, initial encounter: Secondary | ICD-10-CM | POA: Insufficient documentation

## 2012-05-08 DIAGNOSIS — M6281 Muscle weakness (generalized): Secondary | ICD-10-CM | POA: Insufficient documentation

## 2012-05-08 DIAGNOSIS — R5381 Other malaise: Secondary | ICD-10-CM | POA: Insufficient documentation

## 2012-05-08 DIAGNOSIS — R269 Unspecified abnormalities of gait and mobility: Secondary | ICD-10-CM | POA: Insufficient documentation

## 2012-05-08 DIAGNOSIS — IMO0001 Reserved for inherently not codable concepts without codable children: Secondary | ICD-10-CM | POA: Insufficient documentation

## 2012-05-10 ENCOUNTER — Ambulatory Visit: Payer: Medicare Other | Admitting: Physical Therapy

## 2012-05-15 ENCOUNTER — Ambulatory Visit: Payer: Medicare Other | Admitting: Physical Therapy

## 2012-05-17 ENCOUNTER — Ambulatory Visit: Payer: Medicare Other | Admitting: Physical Therapy

## 2012-05-22 ENCOUNTER — Encounter: Payer: Medicare Other | Admitting: Physical Therapy

## 2012-05-24 ENCOUNTER — Encounter: Payer: Medicare Other | Admitting: Physical Therapy

## 2012-05-29 ENCOUNTER — Encounter: Payer: Medicare Other | Admitting: Physical Therapy

## 2012-05-31 ENCOUNTER — Encounter: Payer: Medicare Other | Admitting: Physical Therapy

## 2012-07-13 IMAGING — CT CT ABD-PELV W/ CM
2 of 5 series · 16 of 46 positions shown, 18 images · IV contrast (water/omni  & 80ml omni 300)
Comparison: Renal ultrasound of 05/28/2011.  Plain film of
05/17/2011.  No prior CT.

CLINICAL DATA: Right upper quadrant pain.  Weight loss.  Nausea
vomiting.  Hysterectomy.

CT ABDOMEN AND PELVIS WITH CONTRAST
TECHNIQUE: Multidetector CT imaging of the abdomen and pelvis was
performed following the standard protocol during bolus
administration of intravenous contrast.
Contrast: 80 ml Imnipaque-WBB

[Series 2: routine abdomen · axial · 0.78mm/px · z∈[-360,-20]mm · 13 of 76 slices shown, 15 images]
[im 4/76  soft-tissue]
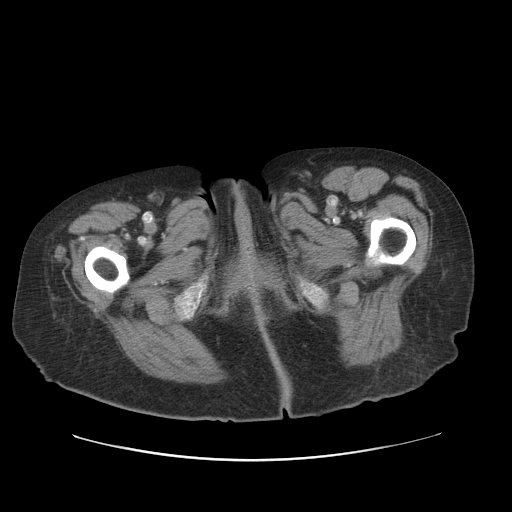
[im 4/76  bone]
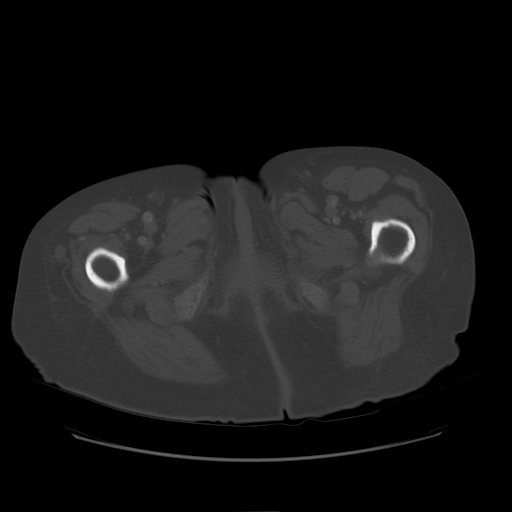
[im 12/76  soft-tissue]
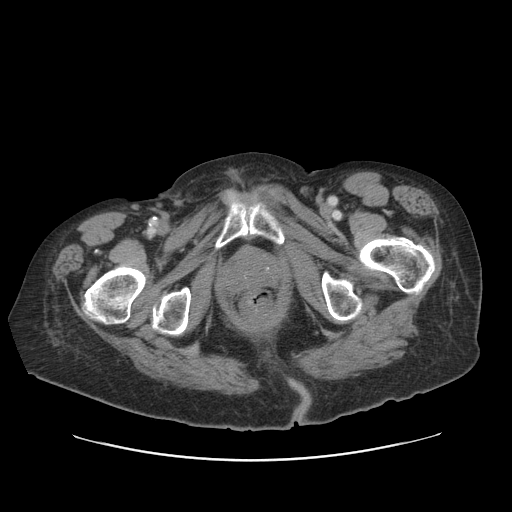
[im 16/76  soft-tissue]
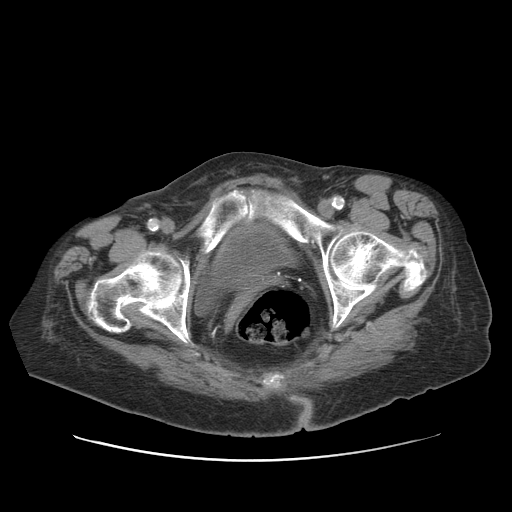
[im 20/76  soft-tissue]
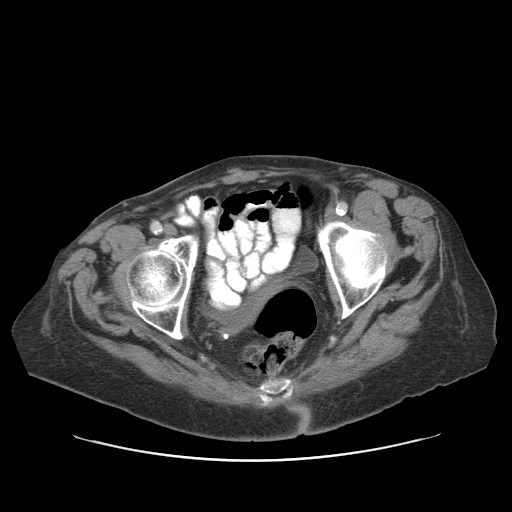
[im 28/76  soft-tissue]
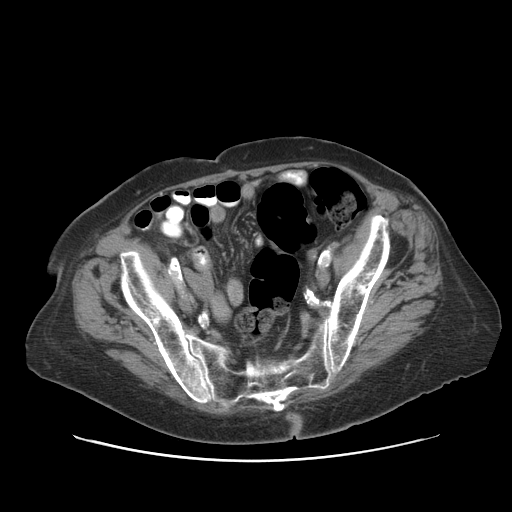
[im 32/76  soft-tissue]
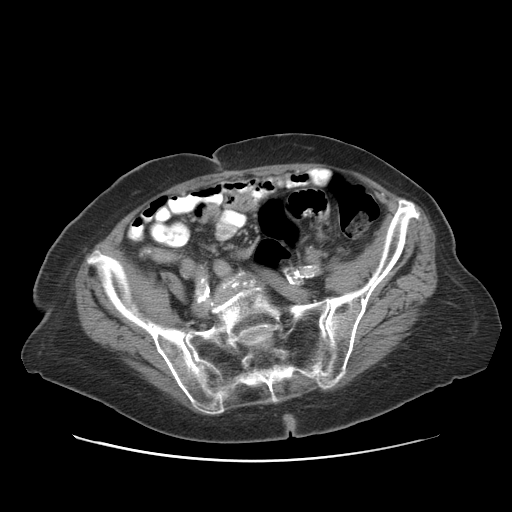
[im 40/76  soft-tissue]
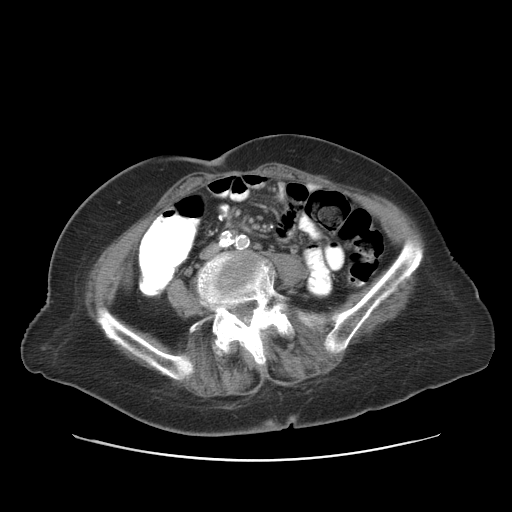
[im 44/76  soft-tissue]
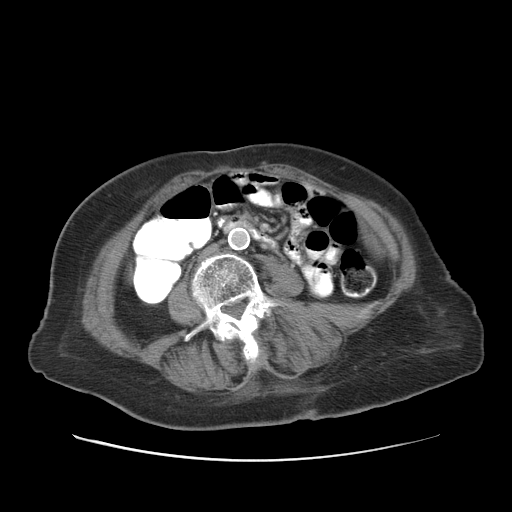
[im 48/76  soft-tissue]
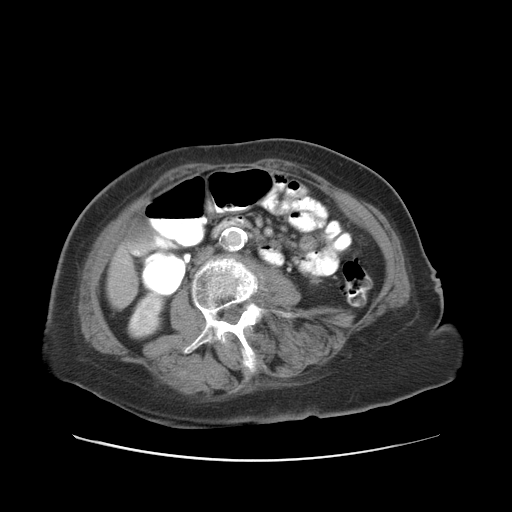
[im 48/76  bone]
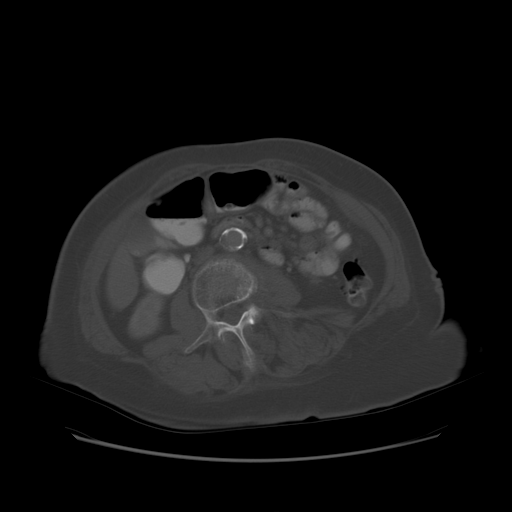
[im 56/76  soft-tissue]
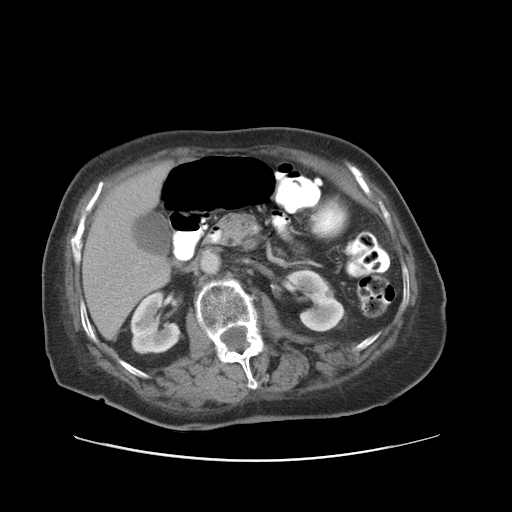
[im 60/76  soft-tissue]
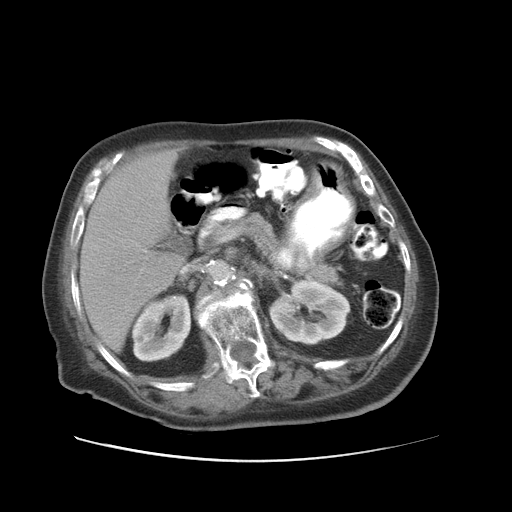
[im 64/76  soft-tissue]
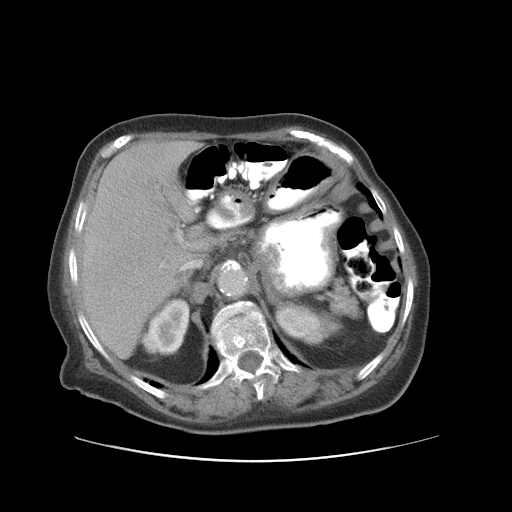
[im 72/76  soft-tissue]
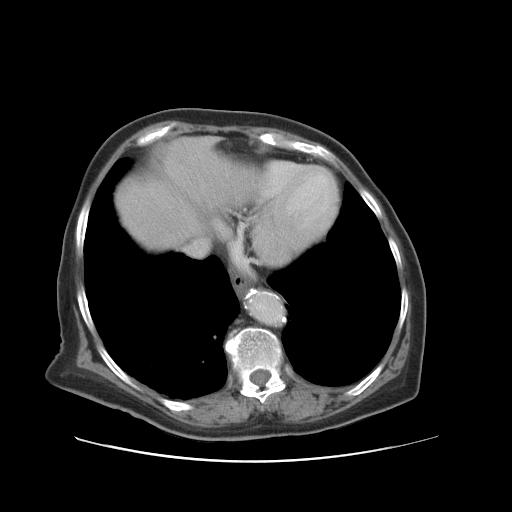

[Series 400: cor · coronal · 0.78mm/px · 3 of 86 slices shown]
[im 29/86  soft-tissue]
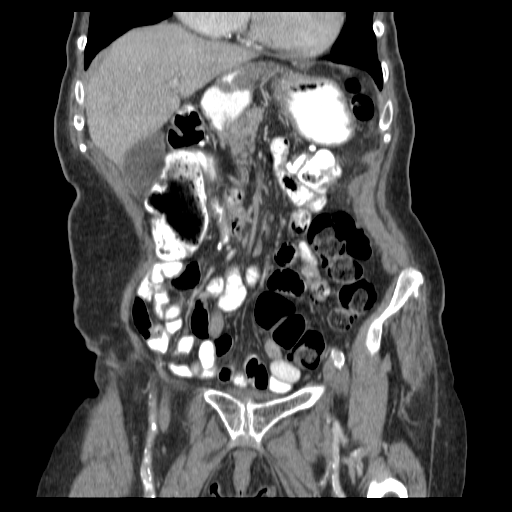
[im 38/86  soft-tissue]
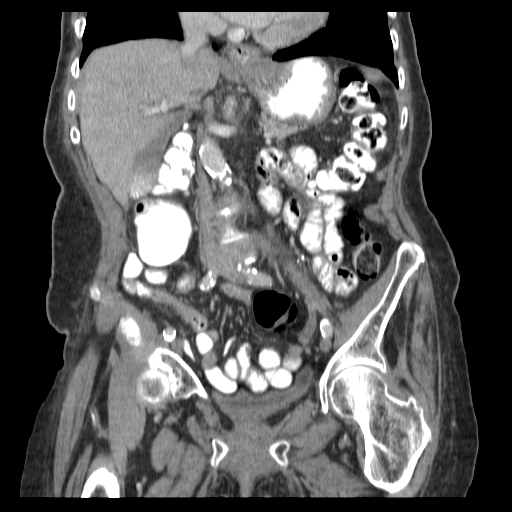
[im 48/86  soft-tissue]
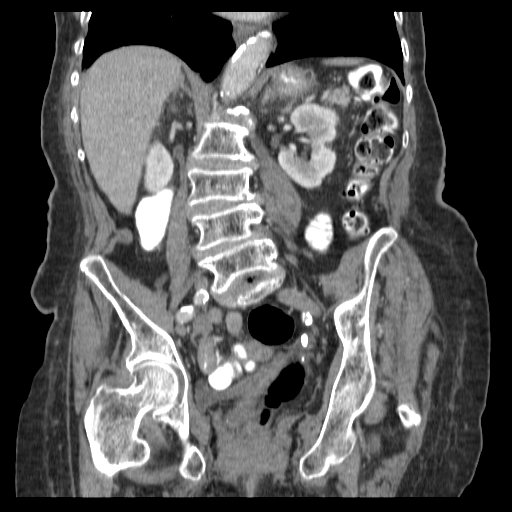

[16 of 46 positions shown; findings below may reference images not displayed]

FINDINGS: Clear lung bases.  Mild cardiomegaly without pericardial
or pleural effusion.  A moderate hiatal hernia.

Normal liver.  Small splenule.  Normal distal stomach.  Apparent
hypoenhancement within the pancreatic tail on image 12 of series 2
is favored to be due to volume averaging.

Small gallstones without acute cholecystitis or biliary ductal
dilatation.

Normal adrenal glands.  Bilateral too small to characterize renal
lesions.  Dense aortic and branch vessel atherosclerosis.
The SMA origin is densely calcified.  Both the celiac axis and
superior mesenteric artery are poorly opacified.  This is at least
partially secondary to bolus timing/decreased contrast
administration. No retroperitoneal or retrocrural adenopathy.

Moderate stool within the colon.  Scattered colonic diverticula
without evidence of diverticulitis.  Normal terminal ileum.
Appendix is not visualized but there is no evidence of right lower
quadrant inflammation.  Normal small bowel without abdominal
ascites.

  No pelvic adenopathy.  Normal urinary bladder.  Hysterectomy.  No
adnexal mass. No significant free fluid.  Moderate osteopenia.  L4-
L5 moderate disc bulge.
IMPRESSION: 1.  Cholelithiasis.  No evidence of cholecystitis.  No other
explanation for right upper quadrant pain.
2.  Severe aortic and branch vessel atherosclerosis.
Hypoenhancement within the celiac axis and superior mesenteric
artery.  This could be partially due to phase of enhancement and
decreased contrast administration.  If the patient's symptoms
suggest mesenteric ischemia, dedicated CTA or MRA should be
considered.
3.  Probable volume averaging within the pancreatic tail.  Tiny
cystic lesion is felt less likely.  If the patient undergoes follow-
up CTA or MRA, recommend attention to this area.

## 2012-07-21 IMAGING — CR DG CHEST 1V PORT
1 series · 1 of 1 positions shown · non-contrast
Comparison: 06/04/2011

CLINICAL DATA: Postop

PORTABLE CHEST - 1 VIEW

[view not recorded]
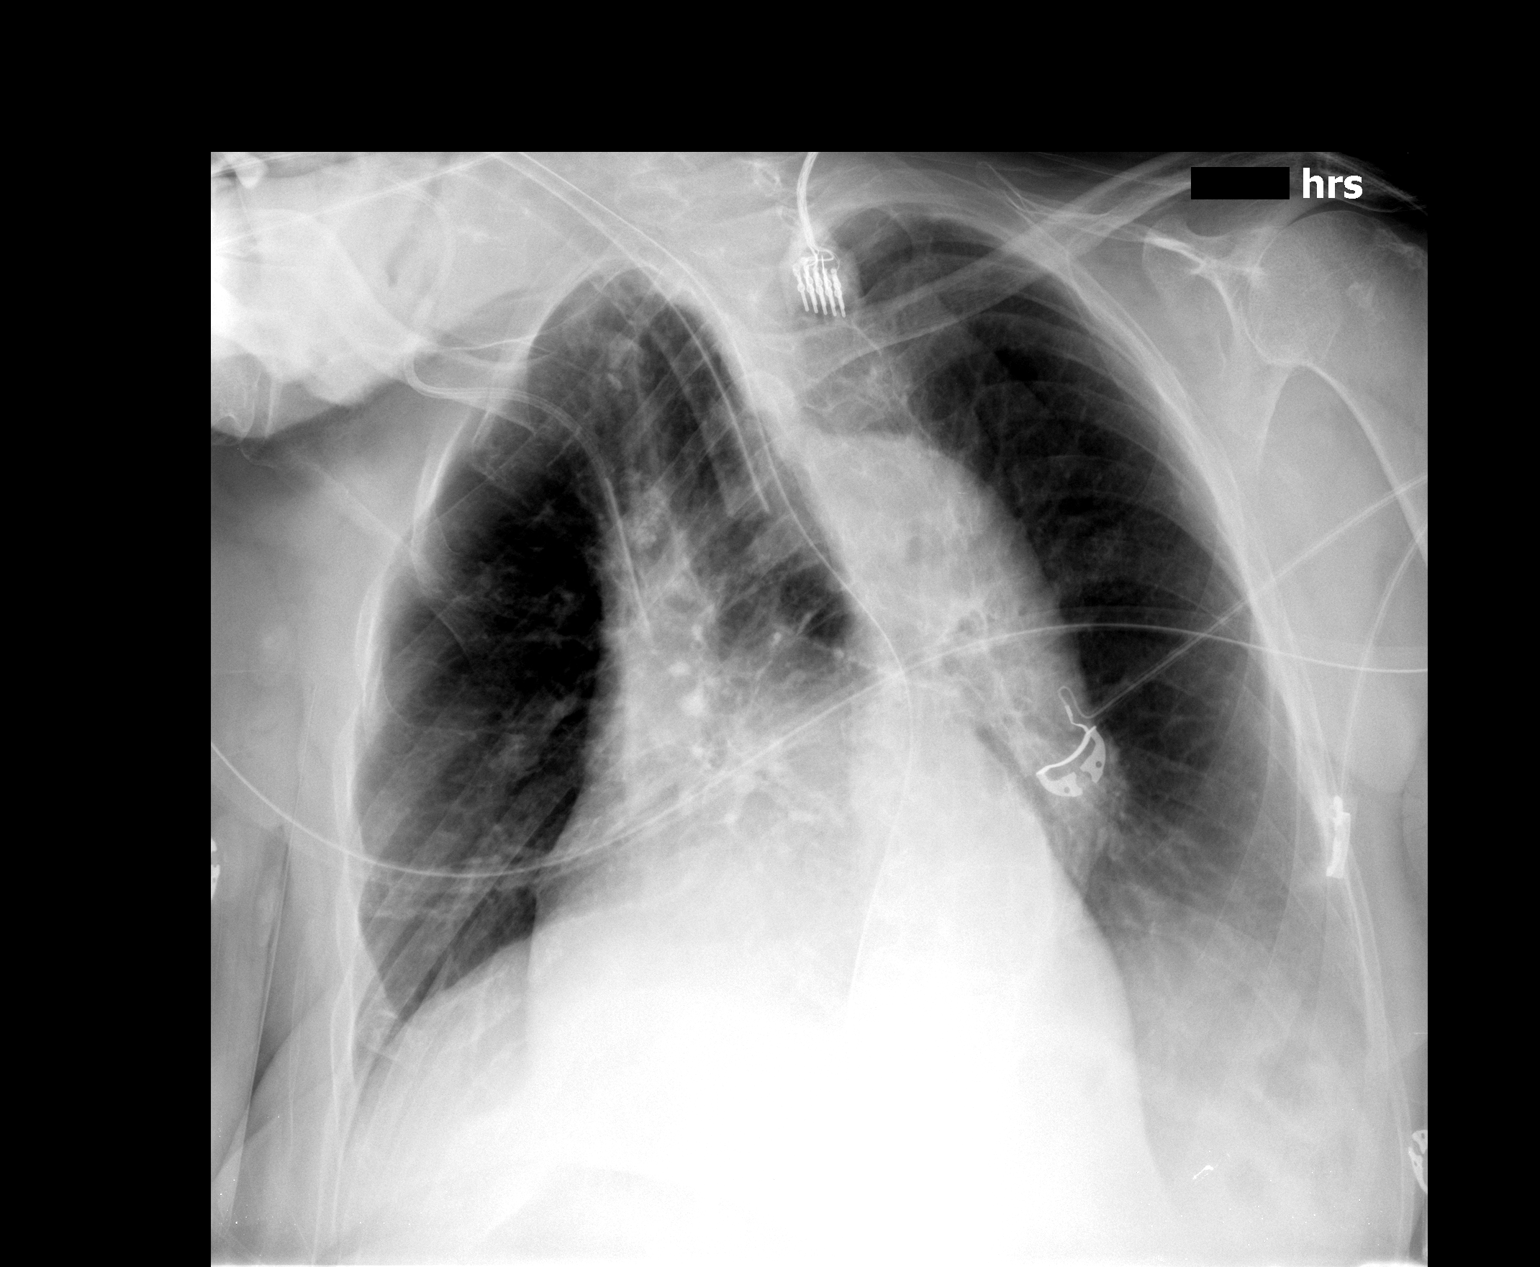

[1 of 1 positions shown; findings below may reference images not displayed]

FINDINGS: Evaluation is constrained by patient rotation.

Left lower lobe opacity, likely a combination of atelectasis and
effusion.  No pneumothorax.

Right IJ venous catheter with its tip in the lower SVC.

Endotracheal tube terminates 3.5 cm above the carina.

Enteric tube courses below the diaphragm.
IMPRESSION: Left lower lobe opacity, likely a combination of atelectasis
effusion.

Endotracheal tube terminates 3.5 cm above the carina. Right IJ
venous catheter with its tip in the lower SVC. Enteric tube courses
below the diaphragm.

No pneumothorax.

## 2012-10-22 ENCOUNTER — Encounter: Payer: Self-pay | Admitting: Vascular Surgery

## 2012-12-18 ENCOUNTER — Ambulatory Visit
Admission: RE | Admit: 2012-12-18 | Discharge: 2012-12-18 | Disposition: A | Discharge status: Home / Self Care | Payer: Medicare Other | Source: Ambulatory Visit | Attending: Cardiovascular Disease | Admitting: Cardiovascular Disease

## 2012-12-18 ENCOUNTER — Other Ambulatory Visit: Payer: Self-pay | Admitting: Cardiovascular Disease

## 2012-12-18 DIAGNOSIS — M7989 Other specified soft tissue disorders: Secondary | ICD-10-CM

## 2013-04-03 ENCOUNTER — Ambulatory Visit
Admission: RE | Admit: 2013-04-03 | Discharge: 2013-04-03 | Disposition: A | Discharge status: Home / Self Care | Payer: No Typology Code available for payment source | Source: Ambulatory Visit | Attending: Internal Medicine | Admitting: Internal Medicine

## 2013-04-03 ENCOUNTER — Other Ambulatory Visit: Payer: Self-pay | Admitting: Internal Medicine

## 2013-04-03 DIAGNOSIS — M869 Osteomyelitis, unspecified: Secondary | ICD-10-CM

## 2013-04-19 ENCOUNTER — Ambulatory Visit (INDEPENDENT_AMBULATORY_CARE_PROVIDER_SITE_OTHER): Payer: Medicare Other | Admitting: Cardiology

## 2013-04-19 ENCOUNTER — Encounter: Payer: Self-pay | Admitting: Cardiology

## 2013-04-19 ENCOUNTER — Telehealth: Payer: Self-pay | Admitting: *Deleted

## 2013-04-19 VITALS — BP 124/86 | HR 61

## 2013-04-19 DIAGNOSIS — Z8679 Personal history of other diseases of the circulatory system: Secondary | ICD-10-CM | POA: Insufficient documentation

## 2013-04-19 DIAGNOSIS — I10 Essential (primary) hypertension: Secondary | ICD-10-CM

## 2013-04-19 DIAGNOSIS — E7800 Pure hypercholesterolemia, unspecified: Secondary | ICD-10-CM

## 2013-04-19 DIAGNOSIS — R9431 Abnormal electrocardiogram [ECG] [EKG]: Secondary | ICD-10-CM

## 2013-04-19 DIAGNOSIS — I4892 Unspecified atrial flutter: Secondary | ICD-10-CM

## 2013-04-19 DIAGNOSIS — I739 Peripheral vascular disease, unspecified: Secondary | ICD-10-CM

## 2013-04-19 DIAGNOSIS — I2510 Atherosclerotic heart disease of native coronary artery without angina pectoris: Secondary | ICD-10-CM | POA: Insufficient documentation

## 2013-04-19 NOTE — Telephone Encounter (Signed)
Spoke with pt dtr-n-law, she is going to fax me a list of the medicine the pt is currently taking.

## 2013-04-19 NOTE — Assessment & Plan Note (Signed)
Blood pressure controlled.continue present medications. 

## 2013-04-19 NOTE — Assessment & Plan Note (Signed)
Continue aspirin and statin. 

## 2013-04-19 NOTE — Progress Notes (Signed)
HPI: 79 year old female previously followed by Dr Algie Coffer with past medical history of atrial fibrillation for evaluation of 3.1 second pulse. No rhythm strips available. Echocardiogram in February of 2013 showed an ejection fraction of 45-50%. There was hypokinesis of the basal inferior myocardium. There was moderate left ventricular hypertrophy with grade 1 diastolic dysfunction. There was moderate left atrial enlargement, moderate mitral regurgitation and moderate tricuspid regurgitation. I do not have all of her records available. She is presently without complaints. She cannot ambulate and she has had a previous right AKA. She denies dyspnea, chest pain, palpitations or syncope.  Current Outpatient Prescriptions  Medication Sig Dispense Refill  . atorvastatin (LIPITOR) 10 MG tablet Take 10 mg by mouth daily.        Marland Kitchen co-enzyme Q-10 30 MG capsule Take 100 mg by mouth 3 (three) times daily.        Marland Kitchen docusate sodium (COLACE) 100 MG capsule Take 100 mg by mouth daily as needed. For constipation      . ferrous gluconate (FERGON) 246 (28 FE) MG tablet Take 65 mg by mouth daily with breakfast.        . furosemide (LASIX) 20 MG tablet Take 2 tablets (40 mg total) by mouth daily.  30 tablet  1  . lisinopril (PRINIVIL,ZESTRIL) 5 MG tablet Take 1 tablet (5 mg total) by mouth daily.  30 tablet  1  . Multiple Vitamin (MULTIVITAMIN) tablet Take 1 tablet by mouth daily.        Marland Kitchen omeprazole (PRILOSEC) 20 MG capsule Take 20 mg by mouth daily.        Marland Kitchen OVER THE COUNTER MEDICATION Take 30 mLs by mouth every 4 (four) hours as needed. For cough  Adult multi-symptom cold medicine      . potassium chloride (K-DUR,KLOR-CON) 10 MEQ tablet Take 10 mEq by mouth daily.        . risedronate (ACTONEL) 35 MG tablet Take 35 mg by mouth every 7 (seven) days. with water on empty stomach, nothing by mouth or lie down for next 30 minutes.        No current facility-administered medications for this visit.    Allergies    Allergen Reactions  . Morphine And Related Anaphylaxis  . Penicillins Other (See Comments)    Makes patient sick    Past Medical History  Diagnosis Date  . Coronary artery disease   . Hypertension   . Hyperlipidemia   . Atrial fibrillation   . Stroke   . DVT (deep venous thrombosis)   . Mesenteric ischemia   . Arthritis   . Blood clot in vein   . Anemia   . GERD (gastroesophageal reflux disease)     Past Surgical History  Procedure Laterality Date  . Abdominal hysterectomy  1964  . Thrombectomy of superior mesenteric artery    . Abdominal aortogram    . Cholecystectomy    . Rt aka      History   Social History  . Marital Status: Single    Spouse Name: N/A    Number of Children: 6  . Years of Education: N/A   Occupational History  . Not on file.   Social History Main Topics  . Smoking status: Never Smoker   . Smokeless tobacco: Former Neurosurgeon    Types: Snuff    Quit date: 05/31/1968  . Alcohol Use: No  . Drug Use: No  . Sexually Active: No   Other Topics Concern  . Not on file  Social History Narrative  . No narrative on file    Family History  Problem Relation Age of Onset  . Cancer Brother   . Diabetes Paternal Aunt     ROS: no fevers or chills, productive cough, hemoptysis, dysphasia, odynophagia, melena, hematochezia, dysuria, hematuria, rash, seizure activity, orthopnea, PND, pedal edema, claudication. Remaining systems are negative.  Physical Exam:   Blood pressure 124/86, pulse 61.  General:  Well developed/frail in NAD Skin warm/dry Patient not depressed No peripheral clubbing Back-normal HEENT-normal/normal eyelids Neck supple/normal carotid upstroke bilaterally; left carotid bruit; no JVD; no thyromegaly chest - CTA/ normal expansion CV - irregular/normal S1 and S2; no murmurs, rubs or gallops;  PMI nondisplaced Abdomen -NT/ND, no HSM, no mass, + bowel sounds, no bruit 2+ femoral pulses, no bruits Ext-status post right AKA.  Trace edema left lower extremity. Neuro-grossly nonfocal  ECG atrial flutter at a rate of 61. Left axis deviation. Prior septal infarct.

## 2013-04-19 NOTE — Assessment & Plan Note (Signed)
Continue statin. 

## 2013-04-19 NOTE — Assessment & Plan Note (Signed)
Continue aspirin and statin. Obtain previous records.

## 2013-04-19 NOTE — Patient Instructions (Addendum)
Your physician recommends that you schedule a follow-up appointment in: 4-6 WEEKS WITH DR CRENSHAW  

## 2013-04-19 NOTE — Assessment & Plan Note (Signed)
Patient is in atrial flutter today. However her rate is controlled. She does not appear to be symptomatic. I have no records available concerning the duration of this. I also have no records concerning her previous care with Dr. Algie Coffer. She has multiple embolic risk factors but may have been previously felt not to be a candidate for Coumadin. She does not know her medications. I will ask for all of her records to be sent to [place]. There also apparently is a question of a pause. She has not had syncope. I last for those records to be sent as well including her electrocardiogram that shows this. We will make further recommendations once I am able to review her records and medications.

## 2013-04-26 NOTE — Telephone Encounter (Signed)
Spoke with pt dtr-n-law, reminded her to fax me the medicine list.

## 2013-05-01 NOTE — Telephone Encounter (Signed)
Med list reviewed by dr Jens Som, dtr-n-law made aware no changes at this time.

## 2013-05-01 NOTE — Telephone Encounter (Signed)
Medication list faxed to me from dtr-n-law. Will have dr Jens Som review.

## 2013-05-02 ENCOUNTER — Encounter: Payer: Self-pay | Admitting: Cardiology

## 2013-06-03 ENCOUNTER — Ambulatory Visit: Payer: Medicare Other | Admitting: Cardiology

## 2013-06-03 ENCOUNTER — Encounter: Payer: Medicare Other | Admitting: Cardiology

## 2013-06-03 NOTE — Progress Notes (Signed)
HPI: Pleasant female for fu of atrial flutter; previously followed by Dr Algie Coffer with past medical history of atrial fibrillation for evaluation of 3.1 second pause. Echocardiogram in February of 2013 showed an ejection fraction of 45-50%. There was hypokinesis of the basal inferior myocardium. There was moderate left ventricular hypertrophy with grade 1 diastolic dysfunction. There was moderate left atrial enlargement, moderate mitral regurgitation and moderate tricuspid regurgitation. Since I last saw her electrocardiogram from 03/08/2013 is available and shows atrial flutter.    Current Outpatient Prescriptions  Medication Sig Dispense Refill  . amiodarone (PACERONE) 100 MG tablet Take 100 mg by mouth daily.      Marland Kitchen atorvastatin (LIPITOR) 10 MG tablet Take 10 mg by mouth daily.        Marland Kitchen docusate sodium (COLACE) 100 MG capsule Take 100 mg by mouth daily as needed. For constipation      . ferrous sulfate 325 (65 FE) MG tablet Take 325 mg by mouth daily with breakfast.      . furosemide (LASIX) 40 MG tablet Take 40 mg by mouth daily.      Marland Kitchen levothyroxine (SYNTHROID, LEVOTHROID) 100 MCG tablet Take 1 tablet (100 mcg total) by mouth daily before breakfast.  30 tablet  3  . levothyroxine (SYNTHROID, LEVOTHROID) 100 MCG tablet Take 100 mcg by mouth daily before breakfast.      . lisinopril (PRINIVIL,ZESTRIL) 2.5 MG tablet Take 2.5 mg by mouth daily.      . Multiple Vitamin (MULTIVITAMIN) tablet Take 1 tablet by mouth daily.        Marland Kitchen omeprazole (PRILOSEC) 20 MG capsule Take 20 mg by mouth daily.        Marland Kitchen OVER THE COUNTER MEDICATION Take 30 mLs by mouth every 4 (four) hours as needed. For cough  Adult multi-symptom cold medicine      . potassium chloride (K-DUR,KLOR-CON) 10 MEQ tablet Take 10 mEq by mouth daily.         No current facility-administered medications for this visit.     Past Medical History  Diagnosis Date  . Coronary artery disease   . Hypertension   . Hyperlipidemia   . Atrial  fibrillation   . Stroke   . DVT (deep venous thrombosis)   . Mesenteric ischemia   . Arthritis   . Blood clot in vein   . Anemia   . GERD (gastroesophageal reflux disease)     Past Surgical History  Procedure Laterality Date  . Abdominal hysterectomy  1964  . Thrombectomy of superior mesenteric artery    . Abdominal aortogram    . Cholecystectomy    . Rt aka      History   Social History  . Marital Status: Single    Spouse Name: N/A    Number of Children: 6  . Years of Education: N/A   Occupational History  . Not on file.   Social History Main Topics  . Smoking status: Never Smoker   . Smokeless tobacco: Former Neurosurgeon    Types: Snuff    Quit date: 05/31/1968  . Alcohol Use: No  . Drug Use: No  . Sexually Active: No   Other Topics Concern  . Not on file   Social History Narrative  . No narrative on file    ROS: no fevers or chills, productive cough, hemoptysis, dysphasia, odynophagia, melena, hematochezia, dysuria, hematuria, rash, seizure activity, orthopnea, PND, pedal edema, claudication. Remaining systems are negative.  Physical Exam: Well-developed well-nourished in no acute  distress.  Skin is warm and dry.  HEENT is normal.  Neck is supple.  Chest is clear to auscultation with normal expansion.  Cardiovascular exam is regular rate and rhythm.  Abdominal exam nontender or distended. No masses palpated. Extremities show no edema. neuro grossly intact  ECG      This encounter was created in error - please disregard.

## 2013-07-15 ENCOUNTER — Encounter: Payer: Self-pay | Admitting: Cardiology

## 2015-09-18 ENCOUNTER — Other Ambulatory Visit: Payer: Self-pay | Admitting: Family Medicine

## 2015-09-18 ENCOUNTER — Ambulatory Visit
Admission: RE | Admit: 2015-09-18 | Discharge: 2015-09-18 | Disposition: A | Discharge status: Home / Self Care | Payer: No Typology Code available for payment source | Source: Ambulatory Visit | Attending: Family Medicine | Admitting: Family Medicine

## 2015-09-18 DIAGNOSIS — T462X5A Adverse effect of other antidysrhythmic drugs, initial encounter: Principal | ICD-10-CM

## 2015-09-18 DIAGNOSIS — J984 Other disorders of lung: Secondary | ICD-10-CM

## 2017-05-24 ENCOUNTER — Other Ambulatory Visit: Payer: Self-pay | Admitting: Family Medicine

## 2017-05-24 ENCOUNTER — Ambulatory Visit
Admission: RE | Admit: 2017-05-24 | Discharge: 2017-05-24 | Disposition: A | Discharge status: Home / Self Care | Payer: Medicare Other | Source: Ambulatory Visit | Attending: Family Medicine | Admitting: Family Medicine

## 2017-05-24 DIAGNOSIS — I252 Old myocardial infarction: Secondary | ICD-10-CM

## 2017-05-24 DIAGNOSIS — Z86718 Personal history of other venous thrombosis and embolism: Secondary | ICD-10-CM

## 2017-05-24 DIAGNOSIS — I2510 Atherosclerotic heart disease of native coronary artery without angina pectoris: Secondary | ICD-10-CM

## 2017-07-04 ENCOUNTER — Encounter (HOSPITAL_COMMUNITY): Payer: Self-pay | Admitting: Emergency Medicine

## 2017-07-04 ENCOUNTER — Emergency Department (HOSPITAL_COMMUNITY): Payer: Medicare (Managed Care)

## 2017-07-04 ENCOUNTER — Observation Stay (HOSPITAL_COMMUNITY)
Admission: EM | Admit: 2017-07-04 | Discharge: 2017-07-05 | Disposition: A | Discharge status: Home / Self Care | Payer: Medicare (Managed Care) | Attending: Internal Medicine | Admitting: Internal Medicine

## 2017-07-04 DIAGNOSIS — Z86718 Personal history of other venous thrombosis and embolism: Secondary | ICD-10-CM | POA: Insufficient documentation

## 2017-07-04 DIAGNOSIS — I2510 Atherosclerotic heart disease of native coronary artery without angina pectoris: Secondary | ICD-10-CM | POA: Diagnosis not present

## 2017-07-04 DIAGNOSIS — I739 Peripheral vascular disease, unspecified: Secondary | ICD-10-CM | POA: Diagnosis present

## 2017-07-04 DIAGNOSIS — I4891 Unspecified atrial fibrillation: Secondary | ICD-10-CM | POA: Diagnosis not present

## 2017-07-04 DIAGNOSIS — Z8679 Personal history of other diseases of the circulatory system: Secondary | ICD-10-CM

## 2017-07-04 DIAGNOSIS — E785 Hyperlipidemia, unspecified: Secondary | ICD-10-CM | POA: Diagnosis not present

## 2017-07-04 DIAGNOSIS — Z79899 Other long term (current) drug therapy: Secondary | ICD-10-CM | POA: Insufficient documentation

## 2017-07-04 DIAGNOSIS — N289 Disorder of kidney and ureter, unspecified: Secondary | ICD-10-CM

## 2017-07-04 DIAGNOSIS — Z8673 Personal history of transient ischemic attack (TIA), and cerebral infarction without residual deficits: Secondary | ICD-10-CM | POA: Diagnosis not present

## 2017-07-04 DIAGNOSIS — N179 Acute kidney failure, unspecified: Secondary | ICD-10-CM

## 2017-07-04 DIAGNOSIS — I10 Essential (primary) hypertension: Secondary | ICD-10-CM | POA: Insufficient documentation

## 2017-07-04 DIAGNOSIS — R079 Chest pain, unspecified: Secondary | ICD-10-CM | POA: Diagnosis not present

## 2017-07-04 DIAGNOSIS — N184 Chronic kidney disease, stage 4 (severe): Secondary | ICD-10-CM

## 2017-07-04 DIAGNOSIS — E039 Hypothyroidism, unspecified: Secondary | ICD-10-CM

## 2017-07-04 DIAGNOSIS — R0789 Other chest pain: Secondary | ICD-10-CM

## 2017-07-04 DIAGNOSIS — I700 Atherosclerosis of aorta: Secondary | ICD-10-CM

## 2017-07-04 DIAGNOSIS — D509 Iron deficiency anemia, unspecified: Secondary | ICD-10-CM | POA: Diagnosis present

## 2017-07-04 LAB — BASIC METABOLIC PANEL
Anion gap: 9 (ref 5–15)
BUN: 67 mg/dL — AB (ref 6–20)
CHLORIDE: 105 mmol/L (ref 101–111)
CO2: 24 mmol/L (ref 22–32)
Calcium: 9 mg/dL (ref 8.9–10.3)
Creatinine, Ser: 2.36 mg/dL — ABNORMAL HIGH (ref 0.44–1.00)
GFR calc non Af Amer: 17 mL/min — ABNORMAL LOW (ref >60)
GFR, EST AFRICAN AMERICAN: 20 mL/min — AB (ref >60)
Glucose, Bld: 96 mg/dL (ref 65–99)
Potassium: 3.9 mmol/L (ref 3.5–5.1)
Sodium: 138 mmol/L (ref 135–145)

## 2017-07-04 LAB — CBC
HCT: 28.6 % — ABNORMAL LOW (ref 36.0–46.0)
Hemoglobin: 9.9 g/dL — ABNORMAL LOW (ref 12.0–15.0)
MCH: 25.9 pg — AB (ref 26.0–34.0)
MCHC: 34.6 g/dL (ref 30.0–36.0)
MCV: 74.9 fL — AB (ref 78.0–100.0)
Platelets: 216 10*3/uL (ref 150–400)
RBC: 3.82 MIL/uL — AB (ref 3.87–5.11)
RDW: 16.7 % — ABNORMAL HIGH (ref 11.5–15.5)
WBC: 9 10*3/uL (ref 4.0–10.5)

## 2017-07-04 LAB — D-DIMER, QUANTITATIVE (NOT AT ARMC): D DIMER QUANT: 9.3 ug{FEU}/mL — AB (ref 0.00–0.50)

## 2017-07-04 LAB — I-STAT TROPONIN, ED: Troponin i, poc: 0.07 ng/mL (ref 0.00–0.08)

## 2017-07-04 LAB — TROPONIN I
TROPONIN I: 0.03 ng/mL — AB (ref <0.03)
Troponin I: <0.03 ng/mL (ref <0.03)

## 2017-07-04 MED ORDER — SENNOSIDES-DOCUSATE SODIUM 8.6-50 MG PO TABS: 1.0000 | ORAL_TABLET | Freq: Every evening | ORAL | Status: DC | PRN

## 2017-07-04 MED ORDER — LEVOTHYROXINE SODIUM 100 MCG PO TABS
100.0000 ug | ORAL_TABLET | Freq: Every day | ORAL | Status: DC
  Administered 2017-07-04 – 2017-07-05 (×2): 100 ug via ORAL
  Filled 2017-07-04 (×2): qty 1

## 2017-07-04 MED ORDER — SODIUM CHLORIDE 0.9 % IV SOLN
INTRAVENOUS | Status: AC
  Administered 2017-07-04: 1000 mL via INTRAVENOUS

## 2017-07-04 MED ORDER — AMIODARONE HCL 100 MG PO TABS
100.0000 mg | ORAL_TABLET | Freq: Every day | ORAL | Status: DC
  Administered 2017-07-04 – 2017-07-05 (×2): 100 mg via ORAL
  Filled 2017-07-04 (×2): qty 1

## 2017-07-04 MED ORDER — SODIUM CHLORIDE 0.9 % IV BOLUS (SEPSIS)
500.0000 mL | Freq: Once | INTRAVENOUS | Status: AC
  Administered 2017-07-04: 500 mL via INTRAVENOUS

## 2017-07-04 MED ORDER — ATORVASTATIN CALCIUM 10 MG PO TABS
10.0000 mg | ORAL_TABLET | Freq: Every day | ORAL | Status: DC
  Administered 2017-07-04 – 2017-07-05 (×2): 10 mg via ORAL
  Filled 2017-07-04 (×3): qty 1

## 2017-07-04 MED ORDER — ENOXAPARIN SODIUM 30 MG/0.3ML ~~LOC~~ SOLN
30.0000 mg | SUBCUTANEOUS | Status: DC
  Administered 2017-07-04: 30 mg via SUBCUTANEOUS
  Filled 2017-07-04 (×2): qty 0.3

## 2017-07-04 MED ORDER — SODIUM CHLORIDE 0.9% FLUSH
3.0000 mL | Freq: Two times a day (BID) | INTRAVENOUS | Status: DC
  Administered 2017-07-05: 3 mL via INTRAVENOUS

## 2017-07-04 MED ORDER — ONE-DAILY MULTI VITAMINS PO TABS: 1.0000 | ORAL_TABLET | Freq: Every day | ORAL | Status: DC

## 2017-07-04 MED ORDER — ASPIRIN EC 81 MG PO TBEC
81.0000 mg | DELAYED_RELEASE_TABLET | Freq: Every day | ORAL | Status: DC
  Administered 2017-07-04 – 2017-07-05 (×2): 81 mg via ORAL
  Filled 2017-07-04 (×2): qty 1

## 2017-07-04 MED ORDER — ADULT MULTIVITAMIN W/MINERALS CH
1.0000 | ORAL_TABLET | Freq: Every day | ORAL | Status: DC
  Administered 2017-07-05: 1 via ORAL
  Filled 2017-07-04: qty 1

## 2017-07-04 MED ORDER — PANTOPRAZOLE SODIUM 40 MG PO TBEC
40.0000 mg | DELAYED_RELEASE_TABLET | Freq: Every day | ORAL | Status: DC
  Administered 2017-07-05: 40 mg via ORAL
  Filled 2017-07-04: qty 1

## 2017-07-04 NOTE — ED Notes (Signed)
Patient c/o pain in her left arm onset Friday, denies injury, states she started having sharp chest pain tonight with radiation into her abd. Denies pain at present. Alert oriented.

## 2017-07-04 NOTE — Care Management Note (Signed)
Case Management Note  Patient Details  Name: DAMIEN CISAR MRN: [id number] Date of Birth: Jan 20, 1925  Subjective/Objective: 83 year old woman with PMH of CAD, PAD s/p Right AKA, Hx of Afib/Flutter (no longer on anticoagulation), HTN, HLD, Chronic Mesenteric Ischemia with Hx of thrombosis of SMA (s/p thrombectomy 2012), DVT, reported history of CVA (w/o residual deficit) and chronic microcytic anemia who presents with chest pain. CM consulted for obs notification. Pt from home with son. [facility] reports [facility] to [facility] a [facility] and has an aide come in the other 2 days a week.                   Action/Plan: Spoke with pt and son who stated they feel like they have all the resouces they need.  [facility] is aware of pt being in the hospital and have already seen the pt in the ED.  Expected Discharge Date:    Unknown              Expected Discharge Plan:  [place]  Discharge planning Services  CM Consult  Post Acute Care Choice:    Choice offered to:  Patient, Adult Children  Status of Service:  In process, will continue to follow   Corky Crafts, RN 07/04/2017, 2:19 PM

## 2017-07-04 NOTE — Plan of Care (Signed)
Problem: Safety: Goal: Ability to remain free from injury will improve Outcome: Progressing Patient has call bell within reach and the bed is at the lowest position. Patient is also close to the nurses station and bed alarm is on.   Problem: Pain Managment: Goal: General experience of comfort will improve Outcome: Progressing Patient has not complained of any pain thus far into the shift.

## 2017-07-04 NOTE — ED Notes (Signed)
Heart Healthy Diet Ordered for Dinner. 

## 2017-07-04 NOTE — H&P (Signed)
Date: 07/04/2017               Patient Name:  [name] MRN: [id number]  DOB: 16-Mar-1925 Age / Sex: 83 y.o., female   PCP: Janifer Adie, MD         Medical Service: Internal Medicine Teaching Service         Attending Physician: Dr. Oval Linsey, MD    First Contact: Dr. Lars Mage Pager: [phone]  Second Contact: Dr. Maryellen Pile Pager: [phone]       After Hours (After 5p/  First Contact Pager: [phone]  weekends / holidays): Second Contact Pager: [phone]   Chief Complaint: Chest Pain  History of Present Illness:  [name] is a pleasant 83 year old woman with PMH of CAD, PAD s/p Right AKA, Hx of Afib/Flutter (no longer on anticoagulation), HTN, HLD, Chronic Mesenteric Ischemia with Hx of thrombosis of SMA (s/p thrombectomy 2012), DVT, reported history of CVA (w/o residual deficit) and chronic microcytic anemia who presents with chest pain. Son and Daughter-in-Law are present to provide additional history.  Patient states that she went to bed around 9 PM last night and developed a sharp central chest pain 10/10 in intensity which radiated down to her stomach. This lasted a few seconds to a minute before resolving on its own. She reports left shoulder pain 3-4 days ago which was not occurring at the time of her episode last night. She denies any radiation of her pain to her arms, neck, jaw, or back. She denies any concurrent diaphoresis, nausea, vomiting, lightheadedness, dizziness, dyspnea, or palpitations. She reports a chronic non-productive cough. She states that her last meal was 3 hours before her pain began. She says her pain has now resolved. She denies any similar episodes in the past. She reports occasional constipation with last bowel movement yesterday. She denies any diarrhea, dysuria, hemoptysis, new focal weakness, falls, or injuries.  In the ED, Vital Signs were: BP 121/89, Pulse 57, Temp 98.4 F (36.9 C), Resp 14, SpO2 100%  Lab work  was notable for a point-of-care Troponin 0.07, D-dimer 9.3, BUN 67, Cr 2.36 (baseline 1.0-1.1). EKG showed NSR, rate 66, Q-waves V1-V4, no siginificant change compared to prior (04/23/03). CXR did not show an acute pulmonary process. She was given 500 mL bolus of NS and IMTS were called for admission.  Meds:  Current Meds  Medication Sig  . amiodarone (PACERONE) 100 MG tablet Take 100 mg by mouth daily.  Marland Kitchen atorvastatin (LIPITOR) 10 MG tablet Take 10 mg by mouth daily.    . Coenzyme Q10 100 MG capsule Take 100 mg by mouth daily.  . furosemide (LASIX) 40 MG tablet Take 40 mg by mouth daily.  Marland Kitchen levothyroxine (SYNTHROID, LEVOTHROID) 100 MCG tablet Take 100 mcg by mouth daily before breakfast.  . lisinopril (PRINIVIL,ZESTRIL) 2.5 MG tablet Take 2.5 mg by mouth daily.  . Multiple Vitamin (MULTIVITAMIN) tablet Take 1 tablet by mouth daily.    Marland Kitchen omeprazole (PRILOSEC) 20 MG capsule Take 20 mg by mouth daily.    . potassium chloride (K-DUR,KLOR-CON) 10 MEQ tablet Take 10 mEq by mouth daily.       Allergies: Allergies as of 07/04/2017 - Review Complete 07/04/2017  Allergen Reaction Noted  . Morphine and related Anaphylaxis 06/01/2011  . Penicillins Other (See Comments) 07/07/2011   Past Medical History:  Diagnosis Date  . Anemia   . Arthritis   . Atrial fibrillation ([place])   . Blood clot  in vein   . Coronary artery disease   . DVT (deep venous thrombosis) ([place])   . GERD (gastroesophageal reflux disease)   . Hyperlipidemia   . Hypertension   . Mesenteric ischemia   . Stroke [facility])     Family History:  Family History  Problem Relation Age of Onset  . Hypertension Sister   . Cancer Brother   . Diabetes Paternal Aunt     Social History:  Lives with son and daughter-in-law. Former use of snuff. Denies cigarette, alcohol, or illicit drug use.  Review of Systems: A complete ROS was negative except as per HPI.  Physical Exam: Blood pressure (!) 104/56, pulse (!) 47, temperature 98.4 F  (36.9 C), temperature source Oral, resp. rate 16, SpO2 100 %. Physical Exam  Constitutional: She is oriented to person, place, and time. No distress.  Pleasant elderly woman  HENT:  Head: Normocephalic and atraumatic.  Wearing dentures  Eyes: Pupils are equal, round, and reactive to light. EOM are normal. No scleral icterus.  Cardiovascular: Exam reveals no gallop and no friction rub.   Murmur heard. Borderline bradycardia, 2/6 systolic murmur loudest at the RUS border  Pulmonary/Chest: Effort normal. No respiratory distress. She has no wheezes. She has no rales. She exhibits no tenderness.  Abdominal: Soft. Bowel sounds are normal. She exhibits no distension. There is no tenderness. There is no guarding.  Musculoskeletal:  S/p Right AKA. LLE with non-pitting edema of the foot. No calf tenderness, swelling, or erythema. DP & PT pulses difficulty to palpate by hand.  Neurological: She is alert and oriented to person, place, and time.  Skin: Skin is warm. She is not diaphoretic.  Increased skin turgor  Psychiatric: She has a normal mood and affect.     EKG: personally reviewed my interpretation is NSR, rate 66, Q-waves V1-V4, no siginificant change compared to prior (04/23/03)  CXR: personally reviewed my interpretation is aortic atherosclerosis, mild cardiomegaly, no consolidation or pleural effusion.   Assessment & Plan by Problem: Principal Problem:   Chest pain Active Problems:   Hypertension   Hyperlipidemia   Microcytic anemia   PAD (peripheral artery disease) (HCC)   History of atrial flutter   CAD (coronary artery disease)   Aortic atherosclerosis (HCC)   AKI (acute kidney injury) ([place])   Hypothyroidism  This is a 83 year old woman with PMH of CAD, PAD s/p Right AKA, Hx of Afib/Flutter (no longer on anticoagulation), HTN, HLD, Chronic Mesenteric Ischemia with Hx of thrombosis of SMA (s/p thrombectomy), DVT, reported history of CVA (w/o residual deficit) and chronic  microcytic anemia who presents with chest pain.  #Chest Pain #CAD #PAD s/p SMA thrombectomy in 2012 and R AKA 2012 Patient presents with a single episode of sharp central chest pain radiating to her stomach without any other associated symptoms. She does have reported history of CAD and significant PAD and likely remote myocardial ischemia. Chart review shows a history of cardiac cath in 1999, however the results are not available. Initial POC troponin is 0.07. EKG is without acute ischemic changes. PE is in the differential with elevated D-dimer, however patient is not having dyspnea, hemoptysis, or hypoxia. PE Clinical Probability score is 1.5 (low - 4% prevalence). She does have significant peripheral artery disease and history of likely chronic mesenteric ischemia with SMA thrombosis s/p thrombectomy. If pain recurs, would consider further evaluation for mesenteric ischemia and/or PE. Will admit for observation and ACS rule out. - Cycle Troponins - Repeat EKG in  the AM - Telemetry - ASA 81 mg daily - Continue Atorvastatin 10 mg daily  #AKI Creatinine on admission is 2.36 compared to a baseline of 1.0 - 1.1 five years ago. Will give gentle IVF and hold ACE-I and Lasix for now.  - NS @ 75 mL/hr for 12 hours - BMET in AM - Monitor I/O - Hold ACE-I & Lasix  #Hx of Afib/Aflutter: Per prior notes, was in Atrial Flutter when seen by cardiology in 2014. She is in NSR on admission with occasional bradycardia. Her CHA2DS2-VASc score is 7 (9.6% yearly stroke risk). She was previously on Coumadin for DVT treatment, however has not continued anticoagulation, possibly due to not being felt a good candidate for chronic anticoagulation. Last TTE in 2013 showed an EF of 45-50%, moderate concentric hypertrophy of the left ventricle, moderate MR with severely calcified anterior leaflet. - Continue Amiodarone 100 mg daily  #HTN Currently stable. - Hold Lisinopril 2.5 mg daily with AKI  #HLD - Continue  Atorvastatin 10 mg daily  #Hypothyroidism - Continue Levothyroxine 100 mcg daily  #Chronic Microcytic Anemia Likely secondary to iron deficiency. Patient was previously on iron supplement, however is no longer taking. She denies any obvious bleeding. No acute issue. - Continue to monitor  Dispo: Admit patient to Observation with expected length of stay less than 2 midnights.  Signed: Zada Finders, MD 07/04/2017, 9:24 AM

## 2017-07-04 NOTE — ED Notes (Signed)
Patient is resting without complaints at present.

## 2017-07-04 NOTE — ED Provider Notes (Signed)
[facility] DEPT Provider Note   CSN: [id number] Arrival date & time: 07/04/17  0036     History   Chief Complaint Chief Complaint  Patient presents with  . Chest Pain    HPI [name] is a 83 y.o. female.  The history is provided by the patient. No language interpreter was used.  Chest Pain   This is a new problem. The current episode started 3 to 5 hours ago. The problem occurs constantly. The problem has been resolved. The pain is associated with rest. The pain is present in the substernal region. The pain is moderate. The quality of the pain is described as dull. The pain radiates to the left shoulder. Pertinent negatives include no abdominal pain and no nausea. She has tried nothing for the symptoms. Risk factors include being elderly and post-menopausal.  Pertinent negatives for past medical history include no Marfan's syndrome.  Pertinent negatives for family medical history include: no aortic dissection.  Procedure history is negative for EPS study.    Past Medical History:  Diagnosis Date  . Anemia   . Arthritis   . Atrial fibrillation ([place])   . Blood clot in vein   . Coronary artery disease   . DVT (deep venous thrombosis) ([place])   . GERD (gastroesophageal reflux disease)   . Hyperlipidemia   . Hypertension   . Mesenteric ischemia   . Stroke [facility])     Patient Active Problem List   Diagnosis Date Noted  . Atrial flutter ([place]) 04/19/2013  . CAD (coronary artery disease) 04/19/2013  . Dizziness 04/07/2012    Class: Acute  . PAD (peripheral artery disease) ([place]) 07/07/2011  . Weight loss 06/01/2011  . Wears dentures 06/01/2011  . High blood pressure 06/01/2011  . Heart disease 06/01/2011  . High cholesterol 06/01/2011  . Nausea / vomiting/diarrhea 06/01/2011  . Abdominal pain 06/01/2011  . Anemia 06/01/2011  . Glaucoma or retinopathy 06/01/2011  . Arthritis 06/01/2011  . Family history of breast cancer 06/01/2011    Past Surgical History:    Procedure Laterality Date  . abdominal aortogram    . ABDOMINAL HYSTERECTOMY  1964  . CHOLECYSTECTOMY    . RT AKA    . thrombectomy of superior mesenteric artery      OB History    No data available       Home Medications    Prior to Admission medications   Medication Sig Start Date End Date Taking? Authorizing Provider  amiodarone (PACERONE) 100 MG tablet Take 100 mg by mouth daily.    [provider]  atorvastatin (LIPITOR) 10 MG tablet Take 10 mg by mouth daily.      [provider]  docusate sodium (COLACE) 100 MG capsule Take 100 mg by mouth daily as needed. For constipation    [provider]  ferrous sulfate 325 (65 FE) MG tablet Take 325 mg by mouth daily with breakfast.    [provider]  furosemide (LASIX) 40 MG tablet Take 40 mg by mouth daily.    [provider]  levothyroxine (SYNTHROID, LEVOTHROID) 100 MCG tablet Take 1 tablet (100 mcg total) by mouth daily before breakfast. 01/18/12 01/17/13  Dixie Dials, MD  levothyroxine (SYNTHROID, LEVOTHROID) 100 MCG tablet Take 100 mcg by mouth daily before breakfast.    [provider]  lisinopril (PRINIVIL,ZESTRIL) 2.5 MG tablet Take 2.5 mg by mouth daily.    [provider]  Multiple Vitamin (MULTIVITAMIN) tablet Take 1 tablet by mouth daily.  [provider]  omeprazole (PRILOSEC) 20 MG capsule Take 20 mg by mouth daily.      [provider]  OVER THE COUNTER MEDICATION Take 30 mLs by mouth every 4 (four) hours as needed. For cough  Adult multi-symptom cold medicine    [provider]  potassium chloride (K-DUR,KLOR-CON) 10 MEQ tablet Take 10 mEq by mouth daily.      [provider]    Family History Family History  Problem Relation Age of Onset  . Cancer Brother   . Diabetes Paternal Aunt     Social History Social History  Substance Use Topics  . Smoking status: Never Smoker  . Smokeless tobacco: Former Systems developer     Types: Snuff    Quit date: 05/31/1968  . Alcohol use No     Allergies   Morphine and related and Penicillins   Review of Systems Review of Systems  Cardiovascular: Positive for chest pain.  Gastrointestinal: Negative for abdominal pain and nausea.  All other systems reviewed and are negative.    Physical Exam Updated Vital Signs BP 121/89 (BP Location: Right Arm)   Pulse (!) 57   Temp 98.4 F (36.9 C) (Oral)   Resp 14   SpO2 100%   Physical Exam  Constitutional: She appears well-developed and well-nourished.  HENT:  Head: Normocephalic and atraumatic.  Mouth/Throat: No oropharyngeal exudate.  Eyes: Pupils are equal, round, and reactive to light. Conjunctivae are normal.  Neck: Normal range of motion. Neck supple.  Cardiovascular: Normal rate, regular rhythm, normal heart sounds and intact distal pulses.   Pulmonary/Chest: Effort normal and breath sounds normal. She has no wheezes. She has no rales.  Abdominal: Soft. Bowel sounds are normal. She exhibits no mass. There is no tenderness. There is no rebound and no guarding.  Skin: Skin is warm and dry. Capillary refill takes less than 2 seconds.  Psychiatric: She has a normal mood and affect.     ED Treatments / Results  Labs (all labs ordered are listed, but only abnormal results are displayed) Labs Reviewed  BASIC METABOLIC PANEL - Abnormal; Notable for the following:       Result Value   BUN 67 (*)    Creatinine, Ser 2.36 (*)    GFR calc non Af Amer 17 (*)    GFR calc Af Amer 20 (*)    All other components within normal limits  CBC - Abnormal; Notable for the following:    RBC 3.82 (*)    Hemoglobin 9.9 (*)    HCT 28.6 (*)    MCV 74.9 (*)    MCH 25.9 (*)    RDW 16.7 (*)    All other components within normal limits  I-STAT TROPONIN, ED    EKG  EKG Interpretation  Date/Time:  Tuesday July 04 2017 00:41:54 EDT Ventricular Rate:  66 PR Interval:  166 QRS Duration: 110 QT Interval:  460 QTC  Calculation: 482 R Axis:   -41 Text Interpretation:  Normal sinus rhythm Anteroseptal infarct , age undetermined Confirmed by Dory Horn) on 07/04/2017 4:44:02 AM       Radiology Dg Chest 2 View  Result Date: 07/04/2017 CLINICAL DATA:  83 y/o F; centralized chest pain and left shoulder pain. EXAM: CHEST  2 VIEW COMPARISON:  05/24/2017 chest radiograph FINDINGS: Stable mild cardiomegaly. Aortic atherosclerosis with calcification. No focal consolidation. No pleural effusion or pneumothorax. Bones are demineralized. Degenerative changes of glenohumeral joints. Loss of the acromiohumeral distance on the  right suggestive of rotator cuff injury. IMPRESSION: No acute pulmonary process identified. Mild cardiomegaly. Aortic atherosclerosis. Electronically Signed   By: Kristine Garbe M.D.   On: 07/04/2017 01:38    Procedures Procedures (including critical care time)  Medications Ordered in ED  Medications  sodium chloride 0.9 % bolus 500 mL (500 mLs Intravenous New Bag/Given 07/04/17 0538)     Final Clinical Impressions(s) / ED Diagnoses  Chest pain: will admit to medicine for rule out and hydration will need PE rule out with VQ due to history    Isis Costanza, MD 07/04/17 8208

## 2017-07-04 NOTE — ED Triage Notes (Signed)
Pt reports she began to have left shoulder pain as she was in bed and then began to have left sided sharp/shooting chest pain.  Denies any other symptoms such as N/V/D/SOB/weakness and diaphoresis.  Pt does have cardiac history and reports that she has been doing her "exercises" at [facility]

## 2017-07-04 NOTE — Care Management Obs Status (Signed)
[facility] NOTIFICATION   Patient Details  Name: AMREEN RACZKOWSKI MRN: [id number] Date of Birth: [birth year]   Medicare Observation Status Notification Given:  Yes    Corky Crafts, RN 07/04/2017, 2:23 PM

## 2017-07-05 ENCOUNTER — Observation Stay (HOSPITAL_BASED_OUTPATIENT_CLINIC_OR_DEPARTMENT_OTHER): Payer: Medicare (Managed Care)

## 2017-07-05 DIAGNOSIS — R079 Chest pain, unspecified: Secondary | ICD-10-CM | POA: Diagnosis not present

## 2017-07-05 DIAGNOSIS — I10 Essential (primary) hypertension: Secondary | ICD-10-CM

## 2017-07-05 DIAGNOSIS — Z8679 Personal history of other diseases of the circulatory system: Secondary | ICD-10-CM | POA: Diagnosis not present

## 2017-07-05 DIAGNOSIS — N184 Chronic kidney disease, stage 4 (severe): Secondary | ICD-10-CM | POA: Diagnosis not present

## 2017-07-05 DIAGNOSIS — N179 Acute kidney failure, unspecified: Secondary | ICD-10-CM | POA: Diagnosis not present

## 2017-07-05 DIAGNOSIS — R0789 Other chest pain: Secondary | ICD-10-CM | POA: Diagnosis not present

## 2017-07-05 DIAGNOSIS — I700 Atherosclerosis of aorta: Secondary | ICD-10-CM | POA: Diagnosis not present

## 2017-07-05 DIAGNOSIS — I2583 Coronary atherosclerosis due to lipid rich plaque: Secondary | ICD-10-CM

## 2017-07-05 DIAGNOSIS — E7800 Pure hypercholesterolemia, unspecified: Secondary | ICD-10-CM | POA: Diagnosis not present

## 2017-07-05 DIAGNOSIS — I2510 Atherosclerotic heart disease of native coronary artery without angina pectoris: Secondary | ICD-10-CM

## 2017-07-05 DIAGNOSIS — I4891 Unspecified atrial fibrillation: Secondary | ICD-10-CM | POA: Diagnosis not present

## 2017-07-05 DIAGNOSIS — E785 Hyperlipidemia, unspecified: Secondary | ICD-10-CM | POA: Diagnosis not present

## 2017-07-05 LAB — BASIC METABOLIC PANEL
Anion gap: 6 (ref 5–15)
Anion gap: 9 (ref 5–15)
BUN: 53 mg/dL — AB (ref 6–20)
BUN: 54 mg/dL — AB (ref 6–20)
CALCIUM: 8.4 mg/dL — AB (ref 8.9–10.3)
CALCIUM: 8 mg/dL — AB (ref 8.9–10.3)
CHLORIDE: 111 mmol/L (ref 101–111)
CO2: 23 mmol/L (ref 22–32)
CO2: 22 mmol/L (ref 22–32)
CREATININE: 1.81 mg/dL — AB (ref 0.44–1.00)
CREATININE: 1.78 mg/dL — AB (ref 0.44–1.00)
Chloride: 107 mmol/L (ref 101–111)
GFR calc non Af Amer: 23 mL/min — ABNORMAL LOW (ref >60)
GFR calc non Af Amer: 24 mL/min — ABNORMAL LOW (ref >60)
GFR, EST AFRICAN AMERICAN: 27 mL/min — AB (ref >60)
GFR, EST AFRICAN AMERICAN: 28 mL/min — AB (ref >60)
GLUCOSE: 87 mg/dL (ref 65–99)
GLUCOSE: 125 mg/dL — AB (ref 65–99)
Potassium: 4.2 mmol/L (ref 3.5–5.1)
Potassium: 4.2 mmol/L (ref 3.5–5.1)
Sodium: 138 mmol/L (ref 135–145)
Sodium: 140 mmol/L (ref 135–145)

## 2017-07-05 LAB — TROPONIN I: Troponin I: <0.03 ng/mL (ref <0.03)

## 2017-07-05 LAB — ECHOCARDIOGRAM COMPLETE
Height: 66 in
Weight: 2064 oz

## 2017-07-05 MED ORDER — WARFARIN SODIUM 5 MG PO TABS: 5.0000 mg | ORAL_TABLET | Freq: Every day | ORAL | 0 refills | Status: DC

## 2017-07-05 MED ORDER — ASPIRIN 81 MG PO TBEC: 81.0000 mg | DELAYED_RELEASE_TABLET | Freq: Every day | ORAL | Status: AC

## 2017-07-05 NOTE — Discharge Instructions (Signed)
Chest Wall Pain °Chest wall pain is pain in or around the bones and muscles of your chest. Sometimes, an injury causes this pain. Sometimes, the cause may not be known. This pain may take several weeks or longer to get better. °Follow these instructions at home: °Pay attention to any changes in your symptoms. Take these actions to help with your pain: °· Rest as told by your doctor. °· Avoid activities that cause pain. Try not to use your chest, belly (abdominal), or side muscles to lift heavy things. °· If directed, apply ice to the painful area: °? Put ice in a plastic bag. °? Place a towel between your skin and the bag. °? Leave the ice on for 20 minutes, 2-3 times per day. °· Take over-the-counter and prescription medicines only as told by your doctor. °· Do not use tobacco products, including cigarettes, chewing tobacco, and e-cigarettes. If you need help quitting, ask your doctor. °· Keep all follow-up visits as told by your doctor. This is important. ° °Contact a doctor if: °· You have a fever. °· Your chest pain gets worse. °· You have new symptoms. °Get help right away if: °· You feel sick to your stomach (nauseous) or you throw up (vomit). °· You feel sweaty or light-headed. °· You have a cough with phlegm (sputum) or you cough up blood. °· You are short of breath. °This information is not intended to replace advice given to you by your health care provider. Make sure you discuss any questions you have with your health care provider. °Document Released: 05/09/2008 Document Revised: 04/28/2016 Document Reviewed: 02/16/2015 °Elsevier Interactive Patient Education © 2018 [employer] Inc. ° °

## 2017-07-05 NOTE — Discharge Summary (Signed)
Name: [name] MRN: [id number] DOB: 1925/09/19 83 y.o. PCP: Melinda Adie, MD  Date of Admission: 07/04/2017  4:35 AM Date of Discharge: 07/05/2017 Attending Physician: Oval Linsey, MD  Discharge Diagnosis: Principal Problem:    Chest pain  Active Problems:   Hypertension   Hyperlipidemia   Microcytic anemia   PAD (peripheral artery disease) (H[facility])   History of atrial flutter   CAD (coronary artery disease)   Aortic atherosclerosis (H[facility])   AKI (acute kidney injury) ([place])   Hypothyroidism   Discharge Medications: Allergies as of 07/05/2017      Reactions   Morphine And Related Anaphylaxis   Penicillins Other (See Comments)   Makes patient sick      Medication List    STOP taking these medications   lisinopril 2.5 MG tablet Commonly known as:  PRINIVIL,ZESTRIL     TAKE these medications   amiodarone 100 MG tablet Commonly known as:  PACERONE Take 100 mg by mouth daily.   aspirin 81 MG EC tablet Take 1 tablet (81 mg total) by mouth daily.   atorvastatin 10 MG tablet Commonly known as:  LIPITOR Take 10 mg by mouth daily.   Coenzyme Q10 100 MG capsule Take 100 mg by mouth daily.   furosemide 40 MG tablet Commonly known as:  LASIX Take 40 mg by mouth daily.   levothyroxine 100 MCG tablet Commonly known as:  SYNTHROID, LEVOTHROID Take 100 mcg by mouth daily before breakfast.   multivitamin tablet Take 1 tablet by mouth daily.   omeprazole 20 MG capsule Commonly known as:  PRILOSEC Take 20 mg by mouth daily.   potassium chloride 10 MEQ tablet Commonly known as:  K-DUR,KLOR-CON Take 10 mEq by mouth daily.   warfarin 5 MG tablet Commonly known as:  COUMADIN Take 1 tablet (5 mg total) by mouth daily at 6 PM.       Disposition and follow-up:   Ms.[name] was discharged from [facility] in Good condition.  At the hospital follow up visit please address:  1.  Atypical chest pain follow up; warfarin initiated  in hospital (5 mg daily) and patient needs follow with INR checks. Patient's daughter stated that she wants a nephrologist referral from PCP for CKD evaluation.  2.  Labs / imaging needed at time of follow-up: BMP to assess renal function, INR  3.  Pending labs/ test needing follow-up: None  Follow-up Appointments: Follow-up Information    Melinda Adie, MD Follow up.   Specialty:  Family Medicine Contact information: [place] [address] [phone]        [name], [place] Follow up on 07/24/2017.   Specialties:  Cardiology, Radiology Why:  3:30 pm for  hospital follow up Contact information: [address] [place] [address] [phone]          Hospital Course by problem list:  1) Chest pain: Patient's ACS workup was negative on admission, as troponin levels were not elevated and no EKG changes were observed. The patient's chest pain resolved quickly during hospitalization with rehydration in ED and she did not complain of another event while hospitalized. An echocardiogram performed prior to discharge showed an EF of 60%, which is higher than previous baseline of 45-50% per chart review, and good LV function. The patient will follow up for atypical chest pain with cardiology as an outpatient.  2) Acute on chronic kidney failure: The patient's baseline creatinine was reported to  be around 2. The patient's creatinine was elevated on admission to 2.36 and rapidly improved to 1.81 with fluid resuscitation. The patient's acute kidney injury was likely 2/2 decreased PO intake, which was reported by family members leading up to admission. The patient was instructed to stay hydrated and to follow up as an outpatient to ensure her kidney function does not continue to worsen. Patient's family was requesting nephrology consult for CKD while in hospital, but instructed to follow up with PCP regarding possible referral as an outpatient.   3) Hx of  Afib/Flutter: Patient had CHA2DS2-VASC score of 7 with 9.6% yearly risk of stroke. Patient on amiodarone for rhythm control but not anti-coagulated. Discussion with patient and family resulted want to re-initiate anti-coagulation therapy and the patient was restarted on warfarin, 5 mg daily. Patient will need to follow up with PCP for medication management, including INR checks and dose adjustment.    Discharge Vitals:   BP 96/70 (BP Location: Right Arm)   Pulse 65   Temp 98.7 F (37.1 C) (Oral)   Resp 16   Ht 5\' 6"  (1.676 m)   Wt 129 lb (58.5 kg)   SpO2 100%   BMI 20.82 kg/m   Pertinent Labs, Studies, and Procedures:  BMP Latest Ref Rng & Units 07/05/2017 07/05/2017 07/04/2017  Glucose 65 - 99 mg/dL 87 125(H) 96  BUN 6 - 20 mg/dL 53(H) 54(H) 67(H)  Creatinine 0.44 - 1.00 mg/dL 1.81(H) 1.78(H) 2.36(H)  Sodium 135 - 145 mmol/L 140 138 138  Potassium 3.5 - 5.1 mmol/L 4.2 4.2 3.9  Chloride 101 - 111 mmol/L 111 107 105  CO2 22 - 32 mmol/L 23 22 24   Calcium 8.9 - 10.3 mg/dL 8.4(L) 8.0(L) 9.0   Impression from Echocardiogram 07/05/2017 Normal LV size with moderate focal basal septal hypertrophy. No LV outflow tract gradient or mitral valve SAM. EF 60-65%. Mild aortic stenosis. Normal RV size and systolic function. Mild pulmonary hypertension.  Discharge Instructions: Discharge Instructions    Call MD for:  difficulty breathing, headache or visual disturbances    Complete by:  As directed    Call MD for:  persistant dizziness or light-headedness    Complete by:  As directed    Call MD for:  persistant nausea and vomiting    Complete by:  As directed    Call MD for:  severe uncontrolled pain    Complete by:  As directed    Call MD for:  temperature >100.4    Complete by:  As directed    Increase activity slowly    Complete by:  As directed       Signed: Thomasene Ripple, MD 07/05/2017, 7:02 PM   Pager: [phone]

## 2017-07-05 NOTE — Progress Notes (Signed)
   Subjective:  Ms Rhodus could not be interviewed directly during rounds today, as an echo was being performed at bedside. The patient's daughter reports that she is doing well and feeling better. The patient's daughter reports that she usually has decreased PO intake at home and the patient has felt better since eating and drinking in hospital. She denies any chest pain.   Objective:  Vital signs in last 24 hours: Vitals:   07/04/17 2300 07/05/17 0100 07/05/17 0345 07/05/17 1411  BP: 100/66 95/60 123/78 96/70  Pulse:   (!) 58 65  Resp:   16 16  Temp:   98 F (36.7 C) 98.7 F (37.1 C)  TempSrc:   Oral Oral  SpO2: 99%  99% 100%  Weight:   129 lb (58.5 kg)   Height:       Physical Exam  Constitutional: She appears well-developed and well-nourished. No distress.  Cardiovascular: Normal rate, regular rhythm, normal heart sounds and intact distal pulses.  Exam reveals no friction rub.   No murmur heard. Pulmonary/Chest: Breath sounds normal. No respiratory distress. She has no wheezes. She has no rales.  Abdominal: Soft. She exhibits no distension. There is no tenderness.  Musculoskeletal: She exhibits no edema (of lower extremity) or tenderness (of lower extremity).  Skin: Skin is warm and dry. Capillary refill takes less than 2 seconds. No rash noted. No erythema.  Psychiatric: She has a normal mood and affect. Her behavior is normal. Thought content normal.   Assessment/Plan:  Principal Problem:   Chest pain Active Problems:   Hypertension   Hyperlipidemia   Microcytic anemia   PAD (peripheral artery disease) (HCC)   History of atrial flutter   CAD (coronary artery disease)   Aortic atherosclerosis (H[facility])   AKI (acute kidney injury) ([place])   Hypothyroidism  Chest Pain: Patient denies current chest pain. The patient's workup for ACS was negative on admission and an echocardiogram was performed today to assess cardiac function. -F/u echocardiogram -If echocardiogram shows  EF at baseline (per chart review, baseline 45-50%) the patient can follow up with cardiology as an outpatient. If worsening EF, then patient will need further workup for new heart failure with decreased EF.   AKI: Creatinine on admission was 2.36. This is resolved today, as the patient's Cr was back to reported baseline of 1.81 with fluid resuscitation.   Hx of Afib/Aflutter: Patient has atrial flutter and showed EF of 45-50%. Repeat echocardiogram today showed EF of 50%. Patient has CHA2DS2-VASc score of 7 and not currently on anticoagulation.  -Continue amiodarone 100 mg daily -Start warfarin 5 mg; follow up as an outpatient with PCP  HTN: -continue hold home lisinopril 2.5 mg in setting of AKI and stable, normotensive BP's during hospitalization -F/u with regimen as outpatient  HLD: -continue home atorvastatin 10 mg  Hypothyroidism: -Continue home levothyroxine 100 mcg daily  Dispo: Anticipated discharge in approximately 1 day(s).   Thomasene Ripple, MD 07/05/2017, 6:22 PM Pager: [phone]

## 2017-07-05 NOTE — Progress Notes (Signed)
CCMD notified RN that Patient had 2.18 pause. RN checked on patient and was asymptomatic. Will continue to monitor.

## 2017-07-05 NOTE — Progress Notes (Addendum)
Cardiology Consult    Patient ID: [name] MRN: [id number], DOB/AGE: 1925-06-19   Admit date: 07/04/2017 Date of Consult: 07/05/2017  Primary Physician: Janifer Adie, MD Primary Cardiologist: Dr. Stanford Breed Requesting Provider: Dr. Eppie Gibson  Reason for Consult: chest pain  Patient Profile    [name] has a PMH significant for atrial fibrillation/flutter, CAD, s/p right AKA, HTN, HLD, GERD, hx of DVT, previously thought to be not a candidate for coumadin, and PAD.   [name] is a 83 y.o. female who is being seen today for the evaluation of chest pain at the request of Dr. Eppie Gibson.    Past Medical History   Past Medical History:  Diagnosis Date  . Anemia   . Arthritis   . Atrial fibrillation ([place])   . Blood clot in vein   . Coronary artery disease   . DVT (deep venous thrombosis) ([place])   . GERD (gastroesophageal reflux disease)   . Hyperlipidemia   . Hypertension   . Mesenteric ischemia   . Stroke [facility])     Past Surgical History:  Procedure Laterality Date  . abdominal aortogram    . ABDOMINAL HYSTERECTOMY  1964  . CHOLECYSTECTOMY    . RT AKA    . thrombectomy of superior mesenteric artery       Allergies  Allergies  Allergen Reactions  . Morphine And Related Anaphylaxis  . Penicillins Other (See Comments)    Makes patient sick    History of Present Illness    [name] has previously seen Dr. Doylene Canard, and most recently (per EPIC) saw Dr. Stanford Breed in clinic on 04/19/13. At that time, she was in atrial flutter and not on anticoagulation. Per EPIC review, she had a cardiac catheterization in 1999. It is unclear if she underwent PCI at that time. Notes indicate she had CAD and is s/p MI.   On my interview, she states that when she went to bed in the supine position on Monday night, her chest hurt and radiated to her stomach. This pain was relieved when she got out of bed and sat up. This was associated with left arm tingling. She has never  felt this pain before. She has not had chest pain since that episode. She denies current chest pain, palpitations, N/V, SOB, dizziness, and feelings of syncope.   Inpatient Medications    . amiodarone  100 mg Oral Daily  . aspirin EC  81 mg Oral Daily  . atorvastatin  10 mg Oral Daily  . enoxaparin (LOVENOX) injection  30 mg Subcutaneous Q24H  . levothyroxine  100 mcg Oral QAC breakfast  . multivitamin with minerals  1 tablet Oral Daily  . pantoprazole  40 mg Oral Daily  . sodium chloride flush  3 mL Intravenous Q12H     Outpatient Medications    Prior to Admission medications   Medication Sig Start Date End Date Taking? Authorizing Provider  amiodarone (PACERONE) 100 MG tablet Take 100 mg by mouth daily.   Yes [provider]  atorvastatin (LIPITOR) 10 MG tablet Take 10 mg by mouth daily.     Yes [provider]  Coenzyme Q10 100 MG capsule Take 100 mg by mouth daily.   Yes [provider]  furosemide (LASIX) 40 MG tablet Take 40 mg by mouth daily.   Yes [provider]  levothyroxine (SYNTHROID, LEVOTHROID) 100 MCG tablet Take 100 mcg by mouth daily before breakfast.   Yes [provider]  lisinopril (PRINIVIL,ZESTRIL)  2.5 MG tablet Take 2.5 mg by mouth daily.   Yes [provider]  Multiple Vitamin (MULTIVITAMIN) tablet Take 1 tablet by mouth daily.     Yes [provider]  omeprazole (PRILOSEC) 20 MG capsule Take 20 mg by mouth daily.     Yes [provider]  potassium chloride (K-DUR,KLOR-CON) 10 MEQ tablet Take 10 mEq by mouth daily.     Yes [provider]     Family History     Family History  Problem Relation Age of Onset  . Hypertension Sister   . Cancer Brother   . Diabetes Paternal Aunt     Social History    Social History   Social History  . Marital status: Single    Spouse name: N/A  . Number of children: 6  . Years of education: N/A   Occupational History  . Not on file.    Social History Main Topics  . Smoking status: Never Smoker  . Smokeless tobacco: Former Systems developer    Types: Snuff    Quit date: 05/31/1968  . Alcohol use No  . Drug use: No  . Sexual activity: No   Other Topics Concern  . Not on file   Social History Narrative  . No narrative on file     Review of Systems    General:  No chills, fever, night sweats or weight changes.  Cardiovascular:  No chest pain, dyspnea on exertion, edema, orthopnea, palpitations, paroxysmal nocturnal dyspnea. Dermatological: No rash, lesions/masses Respiratory: No cough, dyspnea Urologic: No hematuria, dysuria Abdominal:   No nausea, vomiting, diarrhea, bright red blood per rectum, melena, or hematemesis Neurologic:  No visual changes, wkns, changes in mental status. All other systems reviewed and are otherwise negative except as noted above.  Physical Exam    Blood pressure 123/78, pulse (!) 58, temperature 98 F (36.7 C), temperature source Oral, resp. rate 16, height 5\' 6"  (1.676 m), weight 129 lb (58.5 kg), SpO2 99 %.  General: Pleasant, NAD Psych: Normal affect. Neuro: Alert and oriented X 3. Moves all extremities spontaneously. HEENT: Normal  Neck: Supple without bruits or JVD. Lungs:  Resp regular and unlabored, CTA. Heart: Irregular rhythm, regular rate, no murmurs. Abdomen: Soft, non-tender, non-distended, BS + x 4.  Extremities: No clubbing, cyanosis or edema. DP/PT/Radials 2+. Right AKA  Labs    Troponin [facility] Test)  Recent Labs  07/04/17 0108  TROPIPOC 0.07    Recent Labs  07/04/17 1010 07/04/17 1825 07/05/17 0009  TROPONINI 0.03* <0.03 <0.03   Lab Results  Component Value Date   WBC 9.0 07/04/2017   HGB 9.9 (L) 07/04/2017   HCT 28.6 (L) 07/04/2017   MCV 74.9 (L) 07/04/2017   PLT 216 07/04/2017    Recent Labs Lab 07/05/17 0009  NA 138  K 4.2  CL 107  CO2 22  BUN 54*  CREATININE 1.78*  CALCIUM 8.0*  GLUCOSE 125*   Lab Results  Component Value Date     CHOL  12/11/2007    153        ATP III CLASSIFICATION:  <200     mg/dL   Desirable  200-239  mg/dL   Borderline High  >=240    mg/dL   High          HDL 55 12/11/2007   LDLCALC  12/11/2007    85        Total Cholesterol/HDL:CHD Risk Coronary Heart Disease Risk Table  Men   Women  1/2 Average Risk   3.4   3.3  Average Risk       5.0   4.4  2 X Average Risk   9.6   7.1  3 X Average Risk  23.4   11.0        Use the calculated Patient Ratio above and the CHD Risk Table to determine the patient's CHD Risk.        ATP III CLASSIFICATION (LDL):  <100     mg/dL   Optimal  100-129  mg/dL   Near or Above                    Optimal  130-159  mg/dL   Borderline  160-189  mg/dL   High  >190     mg/dL   Very High   TRIG 64 12/11/2007   Lab Results  Component Value Date   DDIMER 9.30 (H) 07/04/2017     Radiology Studies    Dg Chest 2 View  Result Date: 07/04/2017 CLINICAL DATA:  83 y/o F; centralized chest pain and left shoulder pain. EXAM: CHEST  2 VIEW COMPARISON:  05/24/2017 chest radiograph FINDINGS: Stable mild cardiomegaly. Aortic atherosclerosis with calcification. No focal consolidation. No pleural effusion or pneumothorax. Bones are demineralized. Degenerative changes of glenohumeral joints. Loss of the acromiohumeral distance on the right suggestive of rotator cuff injury. IMPRESSION: No acute pulmonary process identified. Mild cardiomegaly. Aortic atherosclerosis. Electronically Signed   By: Kristine Garbe M.D.   On: 07/04/2017 01:38    ECG & Cardiac Imaging    EKG 07/05/17 NSR with prolonged first degree AV block  Echocardiogram 07/05/17: pending  Echocardiogram 01/12/12: Study Conclusions - Left ventricle: The cavity size was normal. There was moderate concentric hypertrophy. Systolic function was mildly reduced. The estimated ejection fraction was in the range of 45% to 50%. There is moderate hypokinesis of the basalinferior  myocardium. Doppler parameters are consistent with abnormal left ventricular relaxation (grade 1 diastolic dysfunction). - Mitral valve: Calcified annulus. Severely calcified leaflets anterior. Moderate regurgitation. - Left atrium: The appendage was moderately dilated. - Tricuspid valve: Moderate regurgitation.  Assessment & Plan    1. Chest pain, ? hx of CAD - troponin x 3 negative - EKG without clear signs of ischemia - echocardiogram from 2013 with mildly reduced LV function and grade 1 DD This chest pain sounds atypical in nature. She had a cath in 1999 and states that she did not have a PCI at that time (records unavailable). Plan to repeat echocardiogram. Enzymes have been negative and she is no longer having chest pain since the initial episode. If echo is normal, we will recommend outpatient myoview to further evaluate reversible ischemia.   2. Atrial fibrillation - continue amiodarone - telemetry with bouts of Afib and NSR - she is rate-controlled and asymptomatic   3. HTN - continue home meds  4. PAD - continue home meds  5. AKI - sCr on admission was 2.36, trending down to 1.78 - may be secondary to dehydration - baseline is 1.03-1.05 - continue to follow   Signed, Ledora Bottcher, PA-C 07/05/2017, 8:22 AM [phone]  Patient seen and independently examined with Fabian Sharp, PA. We discussed all aspects of the encounter. I agree with the assessment and plan as stated above.  Patient with ? History of CAD but cannot find anything definite in Epic.  Was in bed last night and had a sharp pain that started  in her chest and traveled from her chest down into her lower abdomen.  It did not last long and was not associated with nausea, diaphoresis or SOB.  She has not had any pain like this before or since her admission.  EKG is nonischemic but does showed episodic atrial fibrillation rate controlled.  Exam is benign except for right AKA.  Trop is neg x 3.  She  also has acute on CKD stage 3 with creatinine today 1.78 (2.36 on admission).  Given her advanced age and frail state as well as acute on CKD will get 2D echo.  If LVF has not changed would not pursue further cardiac workup at this time unless she has recurrent CP.  Will have her followup with Dr. Stanford Breed as an outpt. In regards to her afib, she is rate controlled.  She had been on anticoagulation in the past but was taken off.  Unclear is she is a candidate for long term anticoagulation due to her debilitated state and advanced age.  Will leave up to discretion of [facility].   Signed: Vonna Drafts [facility] 07/05/2017

## 2017-07-05 NOTE — Plan of Care (Signed)
Problem: Tissue Perfusion: Goal: Risk factors for ineffective tissue perfusion will decrease Outcome: Progressing Pt on Lovenox for VTE. Pt understands that this medication helps prevent blood clots and understands how important it is to receive.

## 2017-07-05 NOTE — Progress Notes (Signed)
Pt given discharge instructions, with daughter at bedside. All questions answered at this time. IV removed. Daughter will be transporting pt home.  Jacqlyn Larsen, RN

## 2017-07-05 NOTE — H&P (Signed)
Internal Medicine Attending Admission Note Date: 07/05/2017  Patient name: [name] Medical record number: [id number] Date of birth: 04-Sep-1925 Age: 83 y.o. Gender: female  I saw and evaluated the patient. I reviewed the resident's note and I agree with the resident's findings and plan as documented in the resident's note.  Chief Complaint(s): Transient chest pain.  History - key components related to admission:  [name] is a 83 year old woman with a history of coronary artery disease by report, peripheral vascular occlusive disease status post a right above-the-knee amputation, history of atrial flutter, hypertension, hyperlipidemia, chronic mesenteric ischemia with a history of thrombosis of the SMA in 2012, and prior CVA without deficits who presents with the acute onset of brief substernal chest pain. She was in her usual state of health until the night prior to admission when, while lying flat in bed, she developed the sudden onset of 10/10 substernal chest pain characterized as sharp and radiating down to the stomach. The pain lasted less than 1 minute and resolved spontaneously. It was not associated with nausea, dizziness, diaphoresis, or radiation other than to the stomach. She has not had a recurrence of this pain. She also denies any similar episodes in the past. She is currently without any GI complaints specifically diarrhea or abdominal pain. She was admitted to the internal medicine teaching service to evaluate for acute ischemia.  Physical Exam - key components related to admission:  Vitals:   07/04/17 2300 07/05/17 0100 07/05/17 0345 07/05/17 1411  BP: 100/66 95/60 123/78 96/70  Pulse:   (!) 58 65  Resp:   16 16  Temp:   98 F (36.7 C) 98.7 F (37.1 C)  TempSrc:   Oral Oral  SpO2: 99%  99% 100%  Weight:   129 lb (58.5 kg)   Height:       Gen.: Well-developed, well-nourished, woman lying comfortably in bed in the left lateral decubitus position undergoing an  echocardiogram. Skin: Mild decreased turgor.  Lab results:  Basic Metabolic Panel:  Recent Labs  07/05/17 0009 07/05/17 1102  NA 138 140  K 4.2 4.2  CL 107 111  CO2 22 23  GLUCOSE 125* 87  BUN 54* 53*  CREATININE 1.78* 1.81*  CALCIUM 8.0* 8.4*   CBC:  Recent Labs  07/04/17 0053  WBC 9.0  HGB 9.9*  HCT 28.6*  MCV 74.9*  PLT 216   Cardiac Enzymes:  Recent Labs  07/04/17 1010 07/04/17 1825 07/05/17 0009  TROPONINI 0.03* <0.03 <0.03   D-Dimer:  Recent Labs  07/04/17 0053  DDIMER 9.30*   Imaging results:   CXR: Personally reviewed. Calcification of aortic knob, mildly tortuous descending thoracic aorta, no pulmonary effusions, infiltrates, or masses.  Other results:  EKG (07/04/2017): Personally reviewed. Sinus rhythm at 66 bpm, left axis deviation, normal intervals, Q waves in V1 through V4, no LVH by voltage, delayed R wave progression, lateral T wave flattening. Other than the T-wave flattening laterally there are no significant changes from the previous ECG on 04/19/2013.  EKG (07/05/2017): Personally reviewed. Normal sinus rhythm at 66 bpm, left axis deviation, normal intervals, Q waves in V1 through V4, no LVH by voltage, delayed R wave progression, improvement in lateral T wave flattening compared to the previous ECG the day before.  Assessment & Plan by Problem:  [name] is a 83 year old woman with a history of coronary artery disease by report, peripheral vascular occlusive disease status post a right above-the-knee amputation, history of atrial flutter, hypertension, hyperlipidemia,  chronic mesenteric ischemia with a history of thrombosis of the SMA in 2012, and prior CVA without deficits who presents with the acute onset of brief substernal chest pain. She is ruled out for an acute myocardial infarction with serial enzymes and ECGs. An echocardiogram demonstrates improved left ventricular ejection fraction compared to a study 5 years ago. The history  was very atypical for a cardiac source of the pain. The etiology of the pain is unclear but may have been musculoskeletal.  1) Non-cardiac atypical chest pain: Currently resolved after less than 1 minute without recurrence. No further evaluation is necessary at this time if the pain does not recur.  2) Disposition: She is stable for discharge home with follow-up in the [facility].

## 2017-07-05 NOTE — Progress Notes (Signed)
  Echocardiogram 2D Echocardiogram has been performed.  [name] 07/05/2017, 1:28 PM

## 2017-07-24 ENCOUNTER — Ambulatory Visit: Payer: Medicare (Managed Care) | Admitting: Physician Assistant

## 2017-12-08 ENCOUNTER — Ambulatory Visit
Admission: RE | Admit: 2017-12-08 | Discharge: 2017-12-08 | Disposition: A | Discharge status: Home / Self Care | Payer: No Typology Code available for payment source | Source: Ambulatory Visit | Attending: Nurse Practitioner | Admitting: Nurse Practitioner

## 2017-12-08 ENCOUNTER — Other Ambulatory Visit: Payer: Self-pay | Admitting: Nurse Practitioner

## 2017-12-08 DIAGNOSIS — R0989 Other specified symptoms and signs involving the circulatory and respiratory systems: Secondary | ICD-10-CM

## 2018-01-30 ENCOUNTER — Other Ambulatory Visit (HOSPITAL_COMMUNITY): Payer: Self-pay | Admitting: *Deleted

## 2018-01-31 ENCOUNTER — Ambulatory Visit (HOSPITAL_COMMUNITY)
Admission: RE | Admit: 2018-01-31 | Discharge: 2018-01-31 | Disposition: A | Discharge status: Home / Self Care | Payer: Medicare (Managed Care) | Source: Ambulatory Visit | Attending: Family Medicine | Admitting: Family Medicine

## 2018-01-31 DIAGNOSIS — D509 Iron deficiency anemia, unspecified: Secondary | ICD-10-CM | POA: Diagnosis present

## 2018-01-31 MED ORDER — FERUMOXYTOL INJECTION 510 MG/17 ML
510.0000 mg | INTRAVENOUS | Status: DC
  Administered 2018-01-31: 510 mg via INTRAVENOUS
  Filled 2018-01-31: qty 17

## 2018-01-31 NOTE — Discharge Instructions (Signed)

## 2018-02-02 ENCOUNTER — Emergency Department (HOSPITAL_COMMUNITY): Payer: Medicare (Managed Care)

## 2018-02-02 ENCOUNTER — Encounter (HOSPITAL_COMMUNITY): Payer: Self-pay | Admitting: Emergency Medicine

## 2018-02-02 ENCOUNTER — Emergency Department (HOSPITAL_COMMUNITY)
Admission: EM | Admit: 2018-02-02 | Discharge: 2018-02-02 | Disposition: A | Discharge status: Home / Self Care | Payer: Medicare (Managed Care) | Attending: Physician Assistant | Admitting: Physician Assistant

## 2018-02-02 DIAGNOSIS — Y9389 Activity, other specified: Secondary | ICD-10-CM | POA: Insufficient documentation

## 2018-02-02 DIAGNOSIS — Y998 Other external cause status: Secondary | ICD-10-CM | POA: Insufficient documentation

## 2018-02-02 DIAGNOSIS — X58XXXA Exposure to other specified factors, initial encounter: Secondary | ICD-10-CM | POA: Insufficient documentation

## 2018-02-02 DIAGNOSIS — I10 Essential (primary) hypertension: Secondary | ICD-10-CM | POA: Insufficient documentation

## 2018-02-02 DIAGNOSIS — Z7982 Long term (current) use of aspirin: Secondary | ICD-10-CM | POA: Insufficient documentation

## 2018-02-02 DIAGNOSIS — Y929 Unspecified place or not applicable: Secondary | ICD-10-CM | POA: Diagnosis not present

## 2018-02-02 DIAGNOSIS — Z8673 Personal history of transient ischemic attack (TIA), and cerebral infarction without residual deficits: Secondary | ICD-10-CM | POA: Insufficient documentation

## 2018-02-02 DIAGNOSIS — S4992XA Unspecified injury of left shoulder and upper arm, initial encounter: Secondary | ICD-10-CM | POA: Diagnosis present

## 2018-02-02 DIAGNOSIS — I2510 Atherosclerotic heart disease of native coronary artery without angina pectoris: Secondary | ICD-10-CM | POA: Diagnosis not present

## 2018-02-02 DIAGNOSIS — S43015A Anterior dislocation of left humerus, initial encounter: Secondary | ICD-10-CM | POA: Diagnosis not present

## 2018-02-02 DIAGNOSIS — Z7901 Long term (current) use of anticoagulants: Secondary | ICD-10-CM | POA: Insufficient documentation

## 2018-02-02 DIAGNOSIS — Z79899 Other long term (current) drug therapy: Secondary | ICD-10-CM | POA: Insufficient documentation

## 2018-02-02 DIAGNOSIS — M25512 Pain in left shoulder: Secondary | ICD-10-CM

## 2018-02-02 LAB — CBC WITH DIFFERENTIAL/PLATELET
Basophils Absolute: 0 K/uL (ref 0.0–0.1)
Basophils Relative: 0 %
Eosinophils Absolute: 0 K/uL (ref 0.0–0.7)
Eosinophils Relative: 0 %
HCT: 26.9 % — ABNORMAL LOW (ref 36.0–46.0)
Hemoglobin: 8.7 g/dL — ABNORMAL LOW (ref 12.0–15.0)
Lymphocytes Relative: 22 %
Lymphs Abs: 2 K/uL (ref 0.7–4.0)
MCH: 23.5 pg — ABNORMAL LOW (ref 26.0–34.0)
MCHC: 32.3 g/dL (ref 30.0–36.0)
MCV: 72.5 fL — ABNORMAL LOW (ref 78.0–100.0)
Monocytes Absolute: 0.2 K/uL (ref 0.1–1.0)
Monocytes Relative: 3 %
Neutro Abs: 6.9 K/uL (ref 1.7–7.7)
Neutrophils Relative %: 75 %
Platelets: 292 K/uL (ref 150–400)
RBC: 3.71 MIL/uL — ABNORMAL LOW (ref 3.87–5.11)
RDW: 18.9 % — ABNORMAL HIGH (ref 11.5–15.5)
WBC: 9.1 K/uL (ref 4.0–10.5)

## 2018-02-02 LAB — BASIC METABOLIC PANEL
Anion gap: 11 (ref 5–15)
BUN: 24 mg/dL — AB (ref 6–20)
CALCIUM: 8.6 mg/dL — AB (ref 8.9–10.3)
CO2: 24 mmol/L (ref 22–32)
Chloride: 103 mmol/L (ref 101–111)
Creatinine, Ser: 1.64 mg/dL — ABNORMAL HIGH (ref 0.44–1.00)
GFR calc Af Amer: 30 mL/min — ABNORMAL LOW (ref >60)
GFR, EST NON AFRICAN AMERICAN: 26 mL/min — AB (ref >60)
GLUCOSE: 115 mg/dL — AB (ref 65–99)
Potassium: 3.5 mmol/L (ref 3.5–5.1)
Sodium: 138 mmol/L (ref 135–145)

## 2018-02-02 LAB — PROTIME-INR
INR: 3.38
Prothrombin Time: 33.9 seconds — ABNORMAL HIGH (ref 11.4–15.2)

## 2018-02-02 MED ORDER — BUPIVACAINE HCL (PF) 0.5 % IJ SOLN
10.0000 mL | Freq: Once | INTRAMUSCULAR | Status: AC
  Administered 2018-02-02: 10 mL
  Filled 2018-02-02: qty 10

## 2018-02-02 MED ORDER — HYDRALAZINE HCL 20 MG/ML IJ SOLN
10.0000 mg | Freq: Once | INTRAMUSCULAR | Status: AC
  Administered 2018-02-02: 10 mg via INTRAVENOUS
  Filled 2018-02-02: qty 1

## 2018-02-02 MED ORDER — OXYCODONE-ACETAMINOPHEN 5-325 MG PO TABS
1.0000 | ORAL_TABLET | Freq: Once | ORAL | Status: AC
  Administered 2018-02-02: 1 via ORAL
  Filled 2018-02-02: qty 1

## 2018-02-02 MED ORDER — FENTANYL CITRATE (PF) 100 MCG/2ML IJ SOLN
50.0000 ug | Freq: Once | INTRAMUSCULAR | Status: AC
  Administered 2018-02-02: 50 ug via INTRAVENOUS
  Filled 2018-02-02: qty 2

## 2018-02-02 MED ORDER — FENTANYL CITRATE (PF) 100 MCG/2ML IJ SOLN
25.0000 ug | Freq: Once | INTRAMUSCULAR | Status: AC
  Administered 2018-02-02: 25 ug via INTRAVENOUS
  Filled 2018-02-02: qty 2

## 2018-02-02 MED ORDER — PROPOFOL 10 MG/ML IV BOLUS
INTRAVENOUS | Status: AC | PRN
  Administered 2018-02-02: 30 mg via INTRAVENOUS

## 2018-02-02 MED ORDER — FENTANYL CITRATE (PF) 100 MCG/2ML IJ SOLN: 25.0000 ug | INTRAMUSCULAR | Status: DC | PRN

## 2018-02-02 MED ORDER — PROPOFOL 10 MG/ML IV BOLUS
1.0000 mg/kg | Freq: Once | INTRAVENOUS | Status: DC
  Filled 2018-02-02: qty 20

## 2018-02-02 MED ORDER — OXYCODONE-ACETAMINOPHEN 5-325 MG PO TABS: 1.0000 | ORAL_TABLET | Freq: Four times a day (QID) | ORAL | 0 refills | Status: DC | PRN

## 2018-02-02 MED ORDER — SODIUM CHLORIDE 0.9 % IV SOLN
INTRAVENOUS | Status: DC
  Administered 2018-02-02: 06:00:00 via INTRAVENOUS

## 2018-02-02 MED ORDER — ONDANSETRON HCL 4 MG/2ML IJ SOLN
4.0000 mg | Freq: Once | INTRAMUSCULAR | Status: AC
  Administered 2018-02-02: 4 mg via INTRAVENOUS
  Filled 2018-02-02: qty 2

## 2018-02-02 MED ORDER — ONDANSETRON HCL 4 MG/2ML IJ SOLN: 4.0000 mg | Freq: Four times a day (QID) | INTRAMUSCULAR | Status: DC | PRN

## 2018-02-02 NOTE — Progress Notes (Signed)
Orthopedic Tech Progress Note Patient Details:  [name] Dec 28, 1924 [id number]  Ortho Devices Type of Ortho Device: Arm sling Ortho Device/Splint Interventions: Application   Post Interventions Patient Tolerated: Well Instructions Provided: Care of device   [place] Pink 02/02/2018, 4:24 PM

## 2018-02-02 NOTE — ED Provider Notes (Signed)
TIME SEEN: 4:40 AM  CHIEF COMPLAINT: Left shoulder pain  HPI: Patient is a 84 year old female with history of coronary artery disease, DVT, hypertension, hyperlipidemia, stroke, atrial fibrillation on Coumadin, peripheral arterial disease status post right AKA who presents to the emergency department with left shoulder pain.  She is right-hand dominant.  Reports shoulder pain has been ongoing for 2-3 weeks.  Daughter reports that she noticed that it appeared abnormal tonight.  They have been using Lidoderm patches and Biofreeze with some relief.  No known injury.  No numbness or tingling.  ROS: See HPI Constitutional: no fever  Eyes: no drainage  ENT: no runny nose   Cardiovascular:  no chest pain  Resp: no SOB  GI: no vomiting GU: no dysuria Integumentary: no rash  Allergy: no hives  Musculoskeletal: no leg swelling  Neurological: no slurred speech ROS otherwise negative  PAST MEDICAL HISTORY/PAST SURGICAL HISTORY:  Past Medical History:  Diagnosis Date  . Anemia   . Arthritis   . Atrial fibrillation ([place])   . Blood clot in vein   . Coronary artery disease   . DVT (deep venous thrombosis) ([place])   . GERD (gastroesophageal reflux disease)   . Hyperlipidemia   . Hypertension   . Mesenteric ischemia   . Stroke [facility])     MEDICATIONS:  Prior to Admission medications   Medication Sig Start Date End Date Taking? Authorizing Provider  acetaminophen (TYLENOL) 325 MG tablet Take by mouth every 6 (six) hours as needed.    [provider]  amiodarone (PACERONE) 100 MG tablet Take 100 mg by mouth daily.    [provider]  aspirin EC 81 MG EC tablet Take 1 tablet (81 mg total) by mouth daily. 07/06/17   Thomasene Ripple, MD  atorvastatin (LIPITOR) 10 MG tablet Take 10 mg by mouth daily.      [provider]  Coenzyme Q10 100 MG capsule Take 100 mg by mouth daily.    [provider]  furosemide (LASIX) 40 MG tablet Take 40 mg by mouth daily.     [provider]  levothyroxine (SYNTHROID, LEVOTHROID) 100 MCG tablet Take 88 mcg by mouth daily before breakfast.     [provider]  lidocaine (LIDODERM) 5 % Place 1 patch onto the skin daily. Remove & Discard patch within 12 hours or as directed by MD    [provider]  Multiple Vitamin (MULTIVITAMIN) tablet Take 1 tablet by mouth daily.      [provider]  omeprazole (PRILOSEC) 20 MG capsule Take 20 mg by mouth daily.      [provider]  potassium chloride (K-DUR,KLOR-CON) 10 MEQ tablet Take 10 mEq by mouth daily.      [provider]  pravastatin (PRAVACHOL) 40 MG tablet Take 40 mg by mouth daily.    [provider]  ranitidine (ZANTAC) 75 MG tablet Take 75 mg by mouth at bedtime.    [provider]  warfarin (COUMADIN) 5 MG tablet Take 1 tablet (5 mg total) by mouth daily at 6 PM. Patient taking differently: Take 2 mg by mouth daily at 6 PM.  07/05/17   Thomasene Ripple, MD    ALLERGIES:  Allergies  Allergen Reactions  . Morphine And Related Anaphylaxis  . Penicillins Other (See Comments)    Makes patient sick    SOCIAL HISTORY:  Social History   Tobacco Use  . Smoking status: Never Smoker  . Smokeless tobacco: Former Systems developer  Types: Snuff  Substance Use Topics  . Alcohol use: No    FAMILY HISTORY: Family History  Problem Relation Age of Onset  . Hypertension Sister   . Cancer Brother   . Diabetes Paternal Aunt     EXAM: BP (!) 182/109 (BP Location: Right Arm)   Pulse 73   Temp 97.7 F (36.5 C) (Temporal)   Resp 20   Ht 5\' 4"  (1.626 m)   Wt 59.4 kg (131 lb)   SpO2 100%   BMI 22.49 kg/m  CONSTITUTIONAL: Alert and oriented and responds appropriately to questions.  Elderly, chronically ill-appearing HEAD: Normocephalic EYES: Conjunctivae clear, pupils appear equal, EOMI ENT: normal nose; moist mucous membranes NECK: Supple, no meningismus, no nuchal rigidity, no LAD  CARD: RRR; S1 and  S2 appreciated; no murmurs, no clicks, no rubs, no gallops RESP: Normal chest excursion without splinting or tachypnea; breath sounds clear and equal bilaterally; no wheezes, no rhonchi, no rales, no hypoxia or respiratory distress, speaking full sentences ABD/GI: Normal bowel sounds; non-distended; soft, non-tender, no rebound, no guarding, no peritoneal signs, no hepatosplenomegaly BACK:  The back appears normal and is non-tender to palpation, there is no CVA tenderness EXT: Obvious deformity to the left shoulder with significant joint effusion.  2+ radial pulses bilaterally.  Normal sensation throughout the left arm.  Normal range of motion of the left elbow, wrist and fingers.  Increased range of motion in left shoulder secondary to dislocation and pain.  Otherwise normal ROM in all joints; otherwise extremities are non-tender to palpation; no edema; normal capillary refill; no cyanosis, no calf tenderness or swelling; patient is status post right AKA SKIN: Normal color for age and race; warm; no rash NEURO: Moves all extremities equally PSYCH: The patient's mood and manner are appropriate. Grooming and personal hygiene are appropriate.  MEDICAL DECISION MAKING: Patient here with left anterior shoulder dislocation.  I think that the shoulder has been out for the past 2-3 weeks.  She did not have any outpatient x-rays.  Attempted to reduce the shoulder using fentanyl and local bupivacaine.  Unfortunately patient not able to relax enough for successful reduction.  Patient was then given 30 mg of IV propofol for sedation after consent obtained.  Still unable to successfully reduce patient's shoulder.  Will discuss with orthopedic physician on-call for their help.  ED PROGRESS: 7:05 AM  Discussed with Dr. Marlou Sa with orthopedic surgery.  He will see patient in consult.  Recommend CT of patient's left shoulder.  He states patient will likely need to go to the operating room under general anesthesia for  reduction.  If this is not successful, patient's family states they are not sure at this time if they would want shoulder replacement.  Appreciate ortho help.   Patient's blood pressure is elevated but I suspect this is from left shoulder pain.  No headache, vision changes, numbness, tingling or focal weakness.  No chest pain or shortness of breath.  Will give dose of IV hydralazine.  Will check basic labs prior to possibly going to the OR.   I reviewed all nursing notes, vitals, pertinent previous records, EKGs, lab and urine results, imaging (as available).     .Sedation Date/Time: 02/02/2018 6:40 AM Performed by: Aashrith Eves, Delice Bison, DO Authorized by: Delaynie Stetzer, Delice Bison, DO   Consent:    Consent obtained:  Verbal   Consent given by:  Patient   Risks discussed:  Allergic reaction, dysrhythmia, inadequate sedation, nausea, prolonged hypoxia resulting in organ damage, prolonged  sedation necessitating reversal, respiratory compromise necessitating ventilatory assistance and intubation and vomiting   Alternatives discussed:  Analgesia without sedation, anxiolysis and regional anesthesia Universal protocol:    Procedure explained and questions answered to patient or proxy's satisfaction: yes     Relevant documents present and verified: yes     Test results available and properly labeled: yes     Imaging studies available: yes     Required blood products, implants, devices, and special equipment available: yes     Site/side marked: yes     Immediately prior to procedure a time out was called: yes     Patient identity confirmation method:  Verbally with patient Indications:    Procedure necessitating sedation performed by:  Physician performing sedation   Intended level of sedation:  Deep Pre-sedation assessment:    Time since last food or drink:  6pm 02/01/18   ASA classification: class 3 - patient with severe systemic disease     Neck mobility: normal     Mouth opening:  3 or more finger  widths   Thyromental distance:  3 finger widths   Mallampati score:  II - soft palate, uvula, fauces visible   Pre-sedation assessments completed and reviewed: airway patency, cardiovascular function, hydration status, mental status, nausea/vomiting, pain level, respiratory function and temperature     Pre-sedation assessment completed:  02/02/2018 6:00 AM Immediate pre-procedure details:    Reassessment: Patient reassessed immediately prior to procedure     Reviewed: vital signs, relevant labs/tests and NPO status     Verified: bag valve mask available, emergency equipment available, intubation equipment available, IV patency confirmed, oxygen available and suction available   Procedure details (see MAR for exact dosages):    Preoxygenation:  Nasal cannula   Sedation:  Propofol   Analgesia:  Fentanyl   Intra-procedure monitoring:  Blood pressure monitoring, cardiac monitor, continuous pulse oximetry, frequent LOC assessments, frequent vital sign checks and continuous capnometry   Intra-procedure events: respiratory depression     Total Provider sedation time (minutes):  15 Post-procedure details:    Post-sedation assessment completed:  02/02/2018 7:00 AM   Attendance: Constant attendance by certified staff until patient recovered     Recovery: Patient returned to pre-procedure baseline     Post-sedation assessments completed and reviewed: airway patency, cardiovascular function, hydration status, mental status, nausea/vomiting, pain level, respiratory function and temperature     Patient is stable for discharge or admission: yes     Patient tolerance:  Tolerated well, no immediate complications Reduction of dislocation Date/Time: 02/02/2018 6:42 AM Performed by: Ryley Bachtel, Delice Bison, DO Authorized by: Prapti Grussing, Delice Bison, DO  Consent: Written consent obtained. Risks and benefits: risks, benefits and alternatives were discussed Consent given by: patient Patient understanding: patient states  understanding of the procedure being performed Patient consent: the patient's understanding of the procedure matches consent given Procedure consent: procedure consent matches procedure scheduled Relevant documents: relevant documents present and verified Test results: test results available and properly labeled Site marked: the operative site was marked Imaging studies: imaging studies available Required items: required blood products, implants, devices, and special equipment available Patient identity confirmed: verbally with patient Time out: Immediately prior to procedure a "time out" was called to verify the correct patient, procedure, equipment, support staff and site/side marked as required. Local anesthesia used: yes  Anesthesia: Local anesthesia used: yes Local Anesthetic: bupivacaine 0.5% without epinephrine Anesthetic total: 6 mL  Sedation: Patient sedated: yes Sedation type: moderate (conscious) sedation Sedatives: fentanyl and propofol  Sedation start date/time: 02/02/2018 6:30 AM Sedation end date/time: 02/02/2018 7:00 AM Vitals: Vital signs were monitored during sedation.  Patient tolerance: Patient tolerated the procedure well with no immediate complications       Kaula Klenke, Delice Bison, DO 02/02/18 0715

## 2018-02-02 NOTE — ED Notes (Signed)
Spoke with ortho tech for sling. EDP consulting with CM

## 2018-02-02 NOTE — Consult Note (Signed)
Reason for Consult: Left shoulder pain Referring Physician:  dr Konrad [name] is an 84 y.o. female.  HPI: [name] is a 84 year old female with multiple medical problems who injured her shoulder at a time which is indeterminant.  She lives with her daughter and her husband.  They noticed onset of shoulder pain 3 weeks ago.  Swelling started yesterday.  She is brought to the emergency room where anterior dislocation was noted.  Significant attempts at reduction were made.  Subsequent CT scan shows reduction of the earlier dislocation but with some persistent inferior subluxation and anterior subluxation without frank dislocation Patient is on Coumadin as a blood thinner  Past Medical History:  Diagnosis Date  . Anemia   . Arthritis   . Atrial fibrillation ([place])   . Blood clot in vein   . Coronary artery disease   . DVT (deep venous thrombosis) ([place])   . GERD (gastroesophageal reflux disease)   . Hyperlipidemia   . Hypertension   . Mesenteric ischemia   . Stroke [facility])     Past Surgical History:  Procedure Laterality Date  . abdominal aortogram    . ABDOMINAL HYSTERECTOMY  1964  . CHOLECYSTECTOMY    . RT AKA    . thrombectomy of superior mesenteric artery      Family History  Problem Relation Age of Onset  . Hypertension Sister   . Cancer Brother   . Diabetes Paternal Aunt     Social History:  reports that  has never smoked. She quit smokeless tobacco use about 49 years ago. Her smokeless tobacco use included snuff. She reports that she does not drink alcohol or use drugs.  Allergies:  Allergies  Allergen Reactions  . Morphine And Related Anaphylaxis  . Penicillins Other (See Comments)    Makes patient sick    Medications: I have reviewed the patient's current medications.  Results for orders placed or performed during the hospital encounter of 02/02/18 (from the past 48 hour(s))  CBC with Differential     Status: Abnormal   Collection Time: 02/02/18  8:26 AM   Result Value Ref Range   WBC 9.1 4.0 - 10.5 K/uL   RBC 3.71 (L) 3.87 - 5.11 MIL/uL   Hemoglobin 8.7 (L) 12.0 - 15.0 g/dL   HCT 26.9 (L) 36.0 - 46.0 %   MCV 72.5 (L) 78.0 - 100.0 fL   MCH 23.5 (L) 26.0 - 34.0 pg   MCHC 32.3 30.0 - 36.0 g/dL   RDW 18.9 (H) 11.5 - 15.5 %   Platelets 292 150 - 400 K/uL   Neutrophils Relative % 75 %   Neutro Abs 6.9 1.7 - 7.7 K/uL   Lymphocytes Relative 22 %   Lymphs Abs 2.0 0.7 - 4.0 K/uL   Monocytes Relative 3 %   Monocytes Absolute 0.2 0.1 - 1.0 K/uL   Eosinophils Relative 0 %   Eosinophils Absolute 0.0 0.0 - 0.7 K/uL   Basophils Relative 0 %   Basophils Absolute 0.0 0.0 - 0.1 K/uL    Comment: Performed at [facility], [address], [place], [place] [address]  Basic metabolic panel     Status: Abnormal   Collection Time: 02/02/18  8:26 AM  Result Value Ref Range   Sodium 138 135 - 145 mmol/L   Potassium 3.5 3.5 - 5.1 mmol/L   Chloride 103 101 - 111 mmol/L   CO2 24 22 - 32 mmol/L   Glucose, Bld 115 (H) 65 - 99  mg/dL   BUN 24 (H) 6 - 20 mg/dL   Creatinine, Ser 1.64 (H) 0.44 - 1.00 mg/dL   Calcium 8.6 (L) 8.9 - 10.3 mg/dL   GFR calc non Af Amer 26 (L) >60 mL/min   GFR calc Af Amer 30 (L) >60 mL/min    Comment: (NOTE) The eGFR has been calculated using the CKD EPI equation. This calculation has not been validated in all clinical situations. eGFR's persistently <60 mL/min signify possible Chronic Kidney Disease.    Anion gap 11 5 - 15    Comment: Performed at [facility] [address], [place], [place] [address]  Protime-INR     Status: Abnormal   Collection Time: 02/02/18  8:26 AM  Result Value Ref Range   Prothrombin Time 33.9 (H) 11.4 - 15.2 seconds   INR 3.38     Comment: Performed at [facility] [address], [place], [place] [address]    Dg Shoulder 1 View Left  Result Date: 02/02/2018 CLINICAL DATA:  84 year old female status post reduction of the left shoulder dislocation. EXAM: LEFT SHOULDER - 1 VIEW  COMPARISON:  Earlier radiograph dated 02/02/2018 FINDINGS: There is persistent dislocated appearance of the left shoulder on this single frontal view. IMPRESSION: Persistent dislocated appearance of the left shoulder. Electronically Signed   By: [name] M.D.   On: 02/02/2018 07:06   Ct Shoulder Left Wo Contrast  Result Date: 02/02/2018 CLINICAL DATA:  Left shoulder pain and swelling. Anterior dislocation of the left glenohumeral joint demonstrated on radiographs dated 02/02/2018. EXAM: CT OF THE UPPER LEFT EXTREMITY WITHOUT CONTRAST TECHNIQUE: Multidetector CT imaging of the upper left extremity was performed according to the standard protocol. COMPARISON:  Radiographs dated 02/02/2018 FINDINGS: Bones/Joint/Cartilage There is anterior inferior subluxation of the left humeral head without dislocation. No acute fractures. Severe arthritic changes of the glenohumeral joint. Chronic detached marginal osteophytes at the anterior rim of the glenoid. Slight chronic concave deformity of the posterolateral aspect of the humeral head may represent a chronic ill Sachs lesion. There is a large glenohumeral joint effusion with some calcific debris within the large effusion. There is also some hemorrhage in the adjacent soft tissues. Minimal degenerative changes at the [facility] joint. Adjacent soft tissue calcifications and ossifications. No scapular fracture or clavicle fracture. IMPRESSION: 1. Reduction of the previously demonstrated anterior dislocation. 2. Residual anterior inferior subluxation of the humeral head with respect to the glenoid. 3. Chronic severe arthritic changes of the glenohumeral joint. 4. Large glenohumeral joint effusion with some hemorrhage in the adjacent soft tissues. 5. No acute fractures. Electronically Signed   By: [name] M.D.   On: 02/02/2018 08:46   Dg Shoulder Left  Result Date: 02/02/2018 CLINICAL DATA:  84 year old female with left shoulder deformity. EXAM: LEFT SHOULDER - 2+  VIEW COMPARISON:  Chest radiograph dated 07/04/2017 FINDINGS: There is anterior dislocation of the left shoulder. The bones are osteopenic. Irregularity of the superolateral aspect of the left humeral head may be chronic or represent a Hill-Sachs injury. Left shoulder effusion noted. IMPRESSION: Anterior dislocation of the left shoulder with possible Hill-Sachs fracture injury. Electronically Signed   By: [name] M.D.   On: 02/02/2018 02:49    Review of Systems  Unable to perform ROS: Medical condition  Musculoskeletal: Positive for joint pain.   Blood pressure (!) 170/97, pulse 83, temperature 97.7 F (36.5 C), temperature source Temporal, resp. rate 16, height _0  (1.626 m), weight 131 lb (59.4 kg),  SpO2 100 %. Physical Exam  Constitutional: She appears well-developed.  HENT:  Head: Normocephalic.  Eyes: Pupils are equal, round, and reactive to light.  Neck: Normal range of motion.  Cardiovascular: Normal rate.  Respiratory: Effort normal.  Neurological: She is alert.  Skin: Skin is warm.  Psychiatric: She has a normal mood and affect.  Examination of both arms demonstrates slightly diminished radial pulses bilaterally.  On the left shoulder there is significant effusion and swelling.  Deltoid does appear to fire but that is difficult to assess.  She has good elbow range of motion.  No definite paresthesias in either hand.  Difficult to assess normal shoulder contours due to effusion in the shoulder joint  Assessment/Plan: Impression is left shoulder instability at a time and duration unknown.  Seems like it may have been 2-3 weeks ago.  According to the daughter with whom the mother lives the swelling started yesterday.  Attempts were made in the emergency room to reduce the fracture and based on CT scan it has been reduced with some persistent anterior subluxation.  Plan at this time is to check INR to see what her amount of blood thinning is.  Also need MRI scan to evaluate for  possible structural intubation to full reduction of the shoulder joint.  Landry Dyke Judah Carchi 02/02/2018, 11:23 AM

## 2018-02-02 NOTE — ED Provider Notes (Signed)
Patient signed out to me follow-up patient imaging and treated with Dr. Marlou Sa.   discussed with Dr. Marlou Sa he would like INR as well as MRI.  MRI ordered.  And returned Dr. Marlou Sa page, called his office, and then touch base with the PA on for orthopedics.  Dr. Marlou Sa would like to discharge patient home.  With sling.  She is very comfortable.  An MRI shows that the shoulder is reduced, though with severe muscular damage.   Discussed with family and they are comfortable with plan.  Patient is a [facility], discussed with his [facility] team as well as case management to help patient at home with services.   Macarthur Critchley, MD 02/02/18 [id number]

## 2018-02-02 NOTE — ED Notes (Signed)
Consents signed and at bedside

## 2018-02-02 NOTE — ED Triage Notes (Signed)
Pt reports L shoulder pain x2 weeks, significant swelling noted today. Lidocaine patches and biofreeze not effective.

## 2018-02-02 NOTE — ED Notes (Signed)
Pt states she is feeling much better.

## 2018-02-02 NOTE — ED Notes (Signed)
[facility] called to confirm pick up; Pt remains waiting for transport home via [facility]

## 2018-02-02 NOTE — ED Notes (Signed)
Dr Marlou Sa at bedside discussing pts ct scan with family and evaluating pt.

## 2018-02-02 NOTE — Discharge Instructions (Signed)
Seen by Dr. Marlou Sa in the emergency department.  Your shoulder likely is back in the right place.  However you got chronically torn all the muscles and ligaments in the area.  He thinks that you may need to wear a immobilizer and then follow-up.  You may require shoulder surgery.

## 2018-02-02 NOTE — Care Management (Addendum)
ED CM received consult from Dr. Thomasene Lot EDP concerning patient who presented to ED this morning with left shoulder pain x2 weeks that has worsened.  Patient is wheelchair bound with a  Rt AKA and uses her left arm to transfer which she is unable to do at this time, as per EDP.   ED CM noted patient is active with [facility]. Contacted [facility] spoke with [place] CSW care transitional plan was arranged for patient to return home with 24 hrs care. Updated EDP and Melissa RN. Patient will be transported home by [facility] as per [place] CSW at [facility]. No further ED CM needs identified.

## 2018-02-02 NOTE — Care Management (Signed)
[facility] called for transport home. 

## 2018-02-07 ENCOUNTER — Ambulatory Visit (HOSPITAL_COMMUNITY)
Admission: RE | Admit: 2018-02-07 | Discharge: 2018-02-07 | Disposition: A | Discharge status: Home / Self Care | Payer: Medicare (Managed Care) | Source: Ambulatory Visit | Attending: Family Medicine | Admitting: Family Medicine

## 2018-02-07 DIAGNOSIS — D509 Iron deficiency anemia, unspecified: Secondary | ICD-10-CM | POA: Insufficient documentation

## 2018-02-07 MED ORDER — FERUMOXYTOL INJECTION 510 MG/17 ML
510.0000 mg | INTRAVENOUS | Status: DC
Start: 2018-02-07 — End: 2018-02-08
  Administered 2018-02-07: 12:00:00 510 mg via INTRAVENOUS
  Filled 2018-02-07: qty 17

## 2018-02-14 ENCOUNTER — Ambulatory Visit (INDEPENDENT_AMBULATORY_CARE_PROVIDER_SITE_OTHER): Payer: Medicare (Managed Care) | Admitting: Orthopedic Surgery

## 2018-02-14 ENCOUNTER — Ambulatory Visit (INDEPENDENT_AMBULATORY_CARE_PROVIDER_SITE_OTHER): Payer: Medicare (Managed Care)

## 2018-02-14 ENCOUNTER — Encounter (INDEPENDENT_AMBULATORY_CARE_PROVIDER_SITE_OTHER): Payer: Self-pay | Admitting: Orthopedic Surgery

## 2018-02-14 DIAGNOSIS — S43015D Anterior dislocation of left humerus, subsequent encounter: Secondary | ICD-10-CM

## 2018-02-14 NOTE — Progress Notes (Signed)
Office Visit Note   Patient: SHAKARA TWEEDY           Date of Birth: Mar 11, 1925           MRN: [id number] Visit Date: 02/14/2018 Requested by: Janifer Adie, MD [address] [place], [place] [address] PCP: Janifer Adie, MD  Subjective: Chief Complaint  Patient presents with  . Left Shoulder - Follow-up, Injury    HPI: Amarys is a 84 year old patient who sustained left shoulder dislocation 02/02/2018.  She states she is doing okay.  She is been in a sling.  Reports some pain in her left hand.  She has been in a wheelchair.  She lives with her children at home.  She does have an aide that comes out daily.              ROS: All systems reviewed are negative as they relate to the chief complaint within the history of present illness.  Patient denies  fevers or chills.   Assessment & Plan: Visit Diagnoses:  1. Anterior dislocation of left shoulder, subsequent encounter     Plan: Impression is left shoulder remains located following dislocation.  Patient's been in a sling.  Effusion is present.  Patient is on blood thinners.  She is going to have a relatively dysfunctional shoulder but needs to stay in that sling for 2 more weeks and then we can start some range of motion exercises.  Her deltoid does fire.  Based on the residual shoulder function she gets after coming out of the sling will determine whether or not she needs any further intervention  Follow-Up Instructions: Return if symptoms worsen or fail to improve.   Orders:  Orders Placed This Encounter  Procedures  . XR Shoulder Left   No orders of the defined types were placed in this encounter.     Procedures: No procedures performed   Clinical Data: No additional findings.  Objective: Vital Signs: There were no vitals taken for this visit.  Physical Exam:   Constitutional: Patient appears well-developed HEENT:  Head: Normocephalic Eyes:EOM are normal Neck: Normal range of motion Cardiovascular:  Normal rate Pulmonary/chest: Effort normal Neurologic: Patient is alert Skin: Skin is warm Psychiatric: Patient has normal mood and affect    Ortho Exam: Orthopedic exam demonstrates some coarseness with passive range of motion but she does have external rotation at 15 degrees of abduction passively to about 40 degrees.  Shoulder effusion is present.  Deltoid does fire.  Radial pulses intact.  Motor sensory function to the hand intact.  Specialty Comments:  No specialty comments available.  Imaging: Xr Shoulder Left  Result Date: 02/14/2018 AP outlet left shoulder reviewed.  No fracture present.  Shoulder is reduced.  Significant degenerative changes present.    PMFS History: Patient Active Problem List   Diagnosis Date Noted  . Acute renal failure superimposed on stage 4 chronic kidney disease ([place])   . Non-cardiac chest pain 07/04/2017  . Aortic atherosclerosis ([place]) 07/04/2017  . AKI (acute kidney injury) ([place]) 07/04/2017  . Hypothyroidism 07/04/2017  . History of atrial flutter 04/19/2013  . CAD (coronary artery disease) 04/19/2013  . Dizziness 04/07/2012    Class: Acute  . PAD (peripheral artery disease) ([place]) 07/07/2011  . Weight loss 06/01/2011  . Wears dentures 06/01/2011  . Hypertension 06/01/2011  . Heart disease 06/01/2011  . Hyperlipidemia 06/01/2011  . Abdominal pain 06/01/2011  . Microcytic anemia 06/01/2011  . Glaucoma or retinopathy 06/01/2011  . Arthritis  06/01/2011  . Family history of breast cancer 06/01/2011   Past Medical History:  Diagnosis Date  . Anemia   . Arthritis   . Atrial fibrillation ([place])   . Blood clot in vein   . Coronary artery disease   . DVT (deep venous thrombosis) ([place])   . GERD (gastroesophageal reflux disease)   . Hyperlipidemia   . Hypertension   . Mesenteric ischemia   . Stroke [facility])     Family History  Problem Relation Age of Onset  . Hypertension Sister   . Cancer Brother   . Diabetes Paternal Aunt     Past  Surgical History:  Procedure Laterality Date  . abdominal aortogram    . ABDOMINAL HYSTERECTOMY  1964  . CHOLECYSTECTOMY    . RT AKA    . thrombectomy of superior mesenteric artery     Social History   Occupational History  . Not on file  Tobacco Use  . Smoking status: Never Smoker  . Smokeless tobacco: Former Systems developer    Types: Snuff  Substance and Sexual Activity  . Alcohol use: No  . Drug use: No  . Sexual activity: No    Birth control/protection: Post-menopausal

## 2018-02-28 ENCOUNTER — Telehealth (INDEPENDENT_AMBULATORY_CARE_PROVIDER_SITE_OTHER): Payer: Self-pay

## 2018-02-28 NOTE — Telephone Encounter (Signed)
Please advise. Thanks.  

## 2018-02-28 NOTE — Telephone Encounter (Signed)
Kia, Occupational Therapist with [facility] would like clarification on what patient can do for left shoulder.  Cb# is [phone].  Please advise.  Thank you.

## 2018-02-28 NOTE — Telephone Encounter (Signed)
Passive range of motion below shoulder level okay for now to start on Monday.  Her shoulder will be dysfunctional.

## 2018-03-01 NOTE — Telephone Encounter (Signed)
IC s/w Kia and advised.

## 2018-03-21 ENCOUNTER — Ambulatory Visit (INDEPENDENT_AMBULATORY_CARE_PROVIDER_SITE_OTHER): Payer: Medicare (Managed Care) | Admitting: Orthopedic Surgery

## 2018-04-04 ENCOUNTER — Encounter (INDEPENDENT_AMBULATORY_CARE_PROVIDER_SITE_OTHER): Payer: Self-pay | Admitting: Orthopedic Surgery

## 2018-04-04 ENCOUNTER — Ambulatory Visit (INDEPENDENT_AMBULATORY_CARE_PROVIDER_SITE_OTHER): Payer: Medicare (Managed Care) | Admitting: Orthopedic Surgery

## 2018-04-04 DIAGNOSIS — S43015D Anterior dislocation of left humerus, subsequent encounter: Secondary | ICD-10-CM | POA: Diagnosis not present

## 2018-04-04 NOTE — Progress Notes (Signed)
Office Visit Note   Patient: [name]           Date of Birth: 12/24/1924           MRN: [id number] Visit Date: 04/04/2018 Requested by: Janifer Adie, MD [address] [place], [place] [address] PCP: Janifer Adie, MD  Subjective: Chief Complaint  Patient presents with  . Left Shoulder - Follow-up    HPI: [name] is a patient who present follow-up left shoulder dislocation.  She is doing better.  She is wearing a sling.              ROS: All systems reviewed are negative as they relate to the chief complaint within the history of present illness.  Patient denies  fevers or chills.   Assessment & Plan: Visit Diagnoses:  1. Anterior dislocation of left shoulder, subsequent encounter     Plan: Impression is anterior shoulder dislocation on the left.  In general the shoulder has decent passive range of motion.  Deltoid fires.  She has significant issues with that left shoulder but it is functional at this time.  Been 2 months since the dislocation.  Currently it is relocated by exam.  I would not let her come out of the sling and I will see her back as needed  Follow-Up Instructions: Return if symptoms worsen or fail to improve.   Orders:  No orders of the defined types were placed in this encounter.  No orders of the defined types were placed in this encounter.     Procedures: No procedures performed   Clinical Data: No additional findings.  Objective: Vital Signs: There were no vitals taken for this visit.  Physical Exam:   Constitutional: Patient appears well-developed HEENT:  Head: Normocephalic Eyes:EOM are normal Neck: Normal range of motion Cardiovascular: Normal rate Pulmonary/chest: Effort normal Neurologic: Patient is alert Skin: Skin is warm Psychiatric: Patient has normal mood and affect    Ortho Exam: Ortho exam demonstrates pretty reasonable and nonpainful passive range of motion of the left shoulder.  I can get her to about 50  degrees of external rotation and 90 degrees of forward flexion and abduction.  Deltoid fires.  She does have predictable coarse grinding and crepitus in the left shoulder.  Specialty Comments:  No specialty comments available.  Imaging: No results found.   PMFS History: Patient Active Problem List   Diagnosis Date Noted  . Acute renal failure superimposed on stage 4 chronic kidney disease ([place])   . Non-cardiac chest pain 07/04/2017  . Aortic atherosclerosis ([place]) 07/04/2017  . AKI (acute kidney injury) ([place]) 07/04/2017  . Hypothyroidism 07/04/2017  . History of atrial flutter 04/19/2013  . CAD (coronary artery disease) 04/19/2013  . Dizziness 04/07/2012    Class: Acute  . PAD (peripheral artery disease) ([place]) 07/07/2011  . Weight loss 06/01/2011  . Wears dentures 06/01/2011  . Hypertension 06/01/2011  . Heart disease 06/01/2011  . Hyperlipidemia 06/01/2011  . Abdominal pain 06/01/2011  . Microcytic anemia 06/01/2011  . Glaucoma or retinopathy 06/01/2011  . Arthritis 06/01/2011  . Family history of breast cancer 06/01/2011   Past Medical History:  Diagnosis Date  . Anemia   . Arthritis   . Atrial fibrillation ([place])   . Blood clot in vein   . Coronary artery disease   . DVT (deep venous thrombosis) ([place])   . GERD (gastroesophageal reflux disease)   . Hyperlipidemia   . Hypertension   . Mesenteric ischemia   .  Stroke [facility])     Family History  Problem Relation Age of Onset  . Hypertension Sister   . Cancer Brother   . Diabetes Paternal Aunt     Past Surgical History:  Procedure Laterality Date  . abdominal aortogram    . ABDOMINAL HYSTERECTOMY  1964  . CHOLECYSTECTOMY    . RT AKA    . thrombectomy of superior mesenteric artery     Social History   Occupational History  . Not on file  Tobacco Use  . Smoking status: Never Smoker  . Smokeless tobacco: Former Systems developer    Types: Snuff  Substance and Sexual Activity  . Alcohol use: No  . Drug use: No  .  Sexual activity: Never    Birth control/protection: Post-menopausal

## 2018-04-20 ENCOUNTER — Encounter: Payer: Medicare (Managed Care) | Admitting: Obstetrics & Gynecology

## 2018-04-24 ENCOUNTER — Other Ambulatory Visit: Payer: Self-pay | Admitting: Family Medicine

## 2018-04-24 ENCOUNTER — Ambulatory Visit
Admission: RE | Admit: 2018-04-24 | Discharge: 2018-04-24 | Disposition: A | Discharge status: Home / Self Care | Payer: Medicare (Managed Care) | Source: Ambulatory Visit | Attending: Family Medicine | Admitting: Family Medicine

## 2018-04-24 DIAGNOSIS — M7989 Other specified soft tissue disorders: Principal | ICD-10-CM

## 2018-04-24 DIAGNOSIS — M79601 Pain in right arm: Secondary | ICD-10-CM

## 2018-04-26 ENCOUNTER — Ambulatory Visit (INDEPENDENT_AMBULATORY_CARE_PROVIDER_SITE_OTHER): Payer: Medicare (Managed Care) | Admitting: Obstetrics & Gynecology

## 2018-04-26 ENCOUNTER — Encounter: Payer: Self-pay | Admitting: Obstetrics & Gynecology

## 2018-04-26 VITALS — BP 146/94 | HR 66 | Wt 127.0 lb

## 2018-04-26 DIAGNOSIS — R58 Hemorrhage, not elsewhere classified: Secondary | ICD-10-CM

## 2018-04-26 NOTE — Progress Notes (Signed)
[facility] to set up referral and they state referral will need to come from [facility]. Encouraged patient's daughter to get a copy of progress note from today to show [facility] and they can set up referral with [facility].

## 2018-04-26 NOTE — Progress Notes (Signed)
   Subjective:    Patient ID: [name], female    DOB: 05-28-1925, 84 y.o.   MRN: [id number]  HPI  84 yo lady here with her daughter in law "Justinian Miano" with the issue of a 2 month h/o intermittent bleeding noted when on the bedpan or with wiping. This last occurred about 2 weeks ago. She denies sexual abuse. She had a hysterectomy in the distant past. She has been told that she has an external hemorrhoid.  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 I do not see a hemorrhoid today.     Assessment & Plan:  Bleeding noted  Does not seem to be a gyn etiology Offered urology referral. Her daughter in law would like this.

## 2018-05-09 ENCOUNTER — Encounter: Payer: Self-pay | Admitting: *Deleted

## 2018-05-23 ENCOUNTER — Other Ambulatory Visit: Payer: Self-pay | Admitting: Nurse Practitioner

## 2018-05-23 DIAGNOSIS — R310 Gross hematuria: Secondary | ICD-10-CM

## 2018-05-30 ENCOUNTER — Ambulatory Visit
Admission: RE | Admit: 2018-05-30 | Discharge: 2018-05-30 | Disposition: A | Discharge status: Home / Self Care | Payer: Medicare (Managed Care) | Source: Ambulatory Visit | Attending: Nurse Practitioner | Admitting: Nurse Practitioner

## 2018-05-30 DIAGNOSIS — R310 Gross hematuria: Secondary | ICD-10-CM

## 2018-05-30 MED ORDER — IOHEXOL 300 MG/ML  SOLN
50.0000 mL | Freq: Once | INTRAMUSCULAR | Status: AC | PRN
  Administered 2018-05-30: 50 mL via INTRAVENOUS

## 2018-08-16 ENCOUNTER — Other Ambulatory Visit: Payer: Self-pay | Admitting: Nurse Practitioner

## 2018-08-16 DIAGNOSIS — R110 Nausea: Secondary | ICD-10-CM

## 2018-08-16 DIAGNOSIS — K219 Gastro-esophageal reflux disease without esophagitis: Secondary | ICD-10-CM

## 2018-08-17 ENCOUNTER — Emergency Department (HOSPITAL_COMMUNITY): Payer: Medicare (Managed Care)

## 2018-08-17 ENCOUNTER — Encounter (HOSPITAL_COMMUNITY): Payer: Self-pay

## 2018-08-17 ENCOUNTER — Ambulatory Visit
Admission: RE | Admit: 2018-08-17 | Discharge: 2018-08-17 | Disposition: A | Discharge status: Home / Self Care | Payer: Medicare (Managed Care) | Source: Ambulatory Visit | Attending: Nurse Practitioner | Admitting: Nurse Practitioner

## 2018-08-17 ENCOUNTER — Inpatient Hospital Stay (HOSPITAL_COMMUNITY)
Admission: EM | Admit: 2018-08-17 | Discharge: 2018-08-22 | DRG: 389 | Disposition: A | Discharge status: Skilled Nursing Facility | Payer: Medicare (Managed Care) | Attending: Family Medicine | Admitting: Family Medicine

## 2018-08-17 DIAGNOSIS — Z86718 Personal history of other venous thrombosis and embolism: Secondary | ICD-10-CM

## 2018-08-17 DIAGNOSIS — E860 Dehydration: Secondary | ICD-10-CM | POA: Diagnosis present

## 2018-08-17 DIAGNOSIS — R402144 Coma scale, eyes open, spontaneous, 24 hours or more after hospital admission: Secondary | ICD-10-CM | POA: Diagnosis not present

## 2018-08-17 DIAGNOSIS — L89152 Pressure ulcer of sacral region, stage 2: Secondary | ICD-10-CM | POA: Diagnosis present

## 2018-08-17 DIAGNOSIS — E440 Moderate protein-calorie malnutrition: Secondary | ICD-10-CM | POA: Diagnosis present

## 2018-08-17 DIAGNOSIS — N390 Urinary tract infection, site not specified: Secondary | ICD-10-CM | POA: Diagnosis present

## 2018-08-17 DIAGNOSIS — B961 Klebsiella pneumoniae [K. pneumoniae] as the cause of diseases classified elsewhere: Secondary | ICD-10-CM | POA: Diagnosis present

## 2018-08-17 DIAGNOSIS — E876 Hypokalemia: Secondary | ICD-10-CM | POA: Diagnosis not present

## 2018-08-17 DIAGNOSIS — Z885 Allergy status to narcotic agent status: Secondary | ICD-10-CM

## 2018-08-17 DIAGNOSIS — I4891 Unspecified atrial fibrillation: Secondary | ICD-10-CM | POA: Diagnosis present

## 2018-08-17 DIAGNOSIS — R32 Unspecified urinary incontinence: Secondary | ICD-10-CM | POA: Diagnosis present

## 2018-08-17 DIAGNOSIS — N184 Chronic kidney disease, stage 4 (severe): Secondary | ICD-10-CM | POA: Diagnosis present

## 2018-08-17 DIAGNOSIS — Z9049 Acquired absence of other specified parts of digestive tract: Secondary | ICD-10-CM

## 2018-08-17 DIAGNOSIS — Z7401 Bed confinement status: Secondary | ICD-10-CM

## 2018-08-17 DIAGNOSIS — E872 Acidosis, unspecified: Secondary | ICD-10-CM | POA: Diagnosis present

## 2018-08-17 DIAGNOSIS — K219 Gastro-esophageal reflux disease without esophagitis: Secondary | ICD-10-CM

## 2018-08-17 DIAGNOSIS — M24412 Recurrent dislocation, left shoulder: Secondary | ICD-10-CM | POA: Diagnosis present

## 2018-08-17 DIAGNOSIS — R402364 Coma scale, best motor response, obeys commands, 24 hours or more after hospital admission: Secondary | ICD-10-CM | POA: Diagnosis not present

## 2018-08-17 DIAGNOSIS — K56609 Unspecified intestinal obstruction, unspecified as to partial versus complete obstruction: Secondary | ICD-10-CM | POA: Diagnosis not present

## 2018-08-17 DIAGNOSIS — Z79899 Other long term (current) drug therapy: Secondary | ICD-10-CM

## 2018-08-17 DIAGNOSIS — D473 Essential (hemorrhagic) thrombocythemia: Secondary | ICD-10-CM | POA: Diagnosis present

## 2018-08-17 DIAGNOSIS — I517 Cardiomegaly: Secondary | ICD-10-CM | POA: Diagnosis present

## 2018-08-17 DIAGNOSIS — Z89611 Acquired absence of right leg above knee: Secondary | ICD-10-CM

## 2018-08-17 DIAGNOSIS — L89622 Pressure ulcer of left heel, stage 2: Secondary | ICD-10-CM | POA: Diagnosis present

## 2018-08-17 DIAGNOSIS — L97409 Non-pressure chronic ulcer of unspecified heel and midfoot with unspecified severity: Secondary | ICD-10-CM

## 2018-08-17 DIAGNOSIS — R110 Nausea: Secondary | ICD-10-CM

## 2018-08-17 DIAGNOSIS — L89609 Pressure ulcer of unspecified heel, unspecified stage: Secondary | ICD-10-CM | POA: Diagnosis present

## 2018-08-17 DIAGNOSIS — Z880 Allergy status to penicillin: Secondary | ICD-10-CM

## 2018-08-17 DIAGNOSIS — Z8673 Personal history of transient ischemic attack (TIA), and cerebral infarction without residual deficits: Secondary | ICD-10-CM

## 2018-08-17 DIAGNOSIS — R188 Other ascites: Secondary | ICD-10-CM | POA: Diagnosis present

## 2018-08-17 DIAGNOSIS — R1310 Dysphagia, unspecified: Secondary | ICD-10-CM | POA: Diagnosis present

## 2018-08-17 DIAGNOSIS — Z0189 Encounter for other specified special examinations: Secondary | ICD-10-CM

## 2018-08-17 DIAGNOSIS — Z720 Tobacco use: Secondary | ICD-10-CM

## 2018-08-17 DIAGNOSIS — I10 Essential (primary) hypertension: Secondary | ICD-10-CM | POA: Diagnosis present

## 2018-08-17 DIAGNOSIS — D631 Anemia in chronic kidney disease: Secondary | ICD-10-CM | POA: Diagnosis present

## 2018-08-17 DIAGNOSIS — I2510 Atherosclerotic heart disease of native coronary artery without angina pectoris: Secondary | ICD-10-CM | POA: Diagnosis present

## 2018-08-17 DIAGNOSIS — I739 Peripheral vascular disease, unspecified: Secondary | ICD-10-CM | POA: Diagnosis present

## 2018-08-17 DIAGNOSIS — E785 Hyperlipidemia, unspecified: Secondary | ICD-10-CM | POA: Diagnosis present

## 2018-08-17 DIAGNOSIS — R402244 Coma scale, best verbal response, confused conversation, 24 hours or more after hospital admission: Secondary | ICD-10-CM | POA: Diagnosis not present

## 2018-08-17 DIAGNOSIS — Z681 Body mass index (BMI) 19 or less, adult: Secondary | ICD-10-CM

## 2018-08-17 DIAGNOSIS — Z8719 Personal history of other diseases of the digestive system: Secondary | ICD-10-CM

## 2018-08-17 DIAGNOSIS — M1990 Unspecified osteoarthritis, unspecified site: Secondary | ICD-10-CM | POA: Diagnosis present

## 2018-08-17 DIAGNOSIS — I129 Hypertensive chronic kidney disease with stage 1 through stage 4 chronic kidney disease, or unspecified chronic kidney disease: Secondary | ICD-10-CM | POA: Diagnosis present

## 2018-08-17 DIAGNOSIS — Z7982 Long term (current) use of aspirin: Secondary | ICD-10-CM

## 2018-08-17 DIAGNOSIS — K5730 Diverticulosis of large intestine without perforation or abscess without bleeding: Secondary | ICD-10-CM | POA: Diagnosis present

## 2018-08-17 DIAGNOSIS — J9811 Atelectasis: Secondary | ICD-10-CM | POA: Diagnosis present

## 2018-08-17 DIAGNOSIS — E870 Hyperosmolality and hypernatremia: Secondary | ICD-10-CM | POA: Diagnosis not present

## 2018-08-17 LAB — I-STAT CHEM 8, ED
BUN: 45 mg/dL — ABNORMAL HIGH (ref 8–23)
CHLORIDE: 92 mmol/L — AB (ref 98–111)
Calcium, Ion: 1.01 mmol/L — ABNORMAL LOW (ref 1.15–1.40)
Creatinine, Ser: 2 mg/dL — ABNORMAL HIGH (ref 0.44–1.00)
Glucose, Bld: 112 mg/dL — ABNORMAL HIGH (ref 70–99)
HCT: 40 % (ref 36.0–46.0)
HEMOGLOBIN: 13.6 g/dL (ref 12.0–15.0)
POTASSIUM: 3.9 mmol/L (ref 3.5–5.1)
SODIUM: 129 mmol/L — AB (ref 135–145)
TCO2: 28 mmol/L (ref 22–32)

## 2018-08-17 LAB — COMPREHENSIVE METABOLIC PANEL
ALK PHOS: 60 U/L (ref 38–126)
ALT: 12 U/L (ref 0–44)
AST: 23 U/L (ref 15–41)
Albumin: 2.5 g/dL — ABNORMAL LOW (ref 3.5–5.0)
Anion gap: 16 — ABNORMAL HIGH (ref 5–15)
BUN: 46 mg/dL — AB (ref 8–23)
CALCIUM: 8.4 mg/dL — AB (ref 8.9–10.3)
CHLORIDE: 90 mmol/L — AB (ref 98–111)
CO2: 27 mmol/L (ref 22–32)
CREATININE: 1.81 mg/dL — AB (ref 0.44–1.00)
GFR calc non Af Amer: 23 mL/min — ABNORMAL LOW (ref >60)
GFR, EST AFRICAN AMERICAN: 27 mL/min — AB (ref >60)
GLUCOSE: 115 mg/dL — AB (ref 70–99)
Potassium: 4 mmol/L (ref 3.5–5.1)
Sodium: 133 mmol/L — ABNORMAL LOW (ref 135–145)
Total Bilirubin: 0.8 mg/dL (ref 0.3–1.2)
Total Protein: 7.1 g/dL (ref 6.5–8.1)

## 2018-08-17 LAB — CBC WITH DIFFERENTIAL/PLATELET
BASOS ABS: 0 10*3/uL (ref 0.0–0.1)
Basophils Relative: 0 %
Eosinophils Absolute: 0 10*3/uL (ref 0.0–0.7)
Eosinophils Relative: 0 %
HCT: 36.8 % (ref 36.0–46.0)
HEMOGLOBIN: 12.8 g/dL (ref 12.0–15.0)
LYMPHS ABS: 0.8 10*3/uL (ref 0.7–4.0)
LYMPHS PCT: 9 %
MCH: 25.4 pg — AB (ref 26.0–34.0)
MCHC: 34.8 g/dL (ref 30.0–36.0)
MCV: 73 fL — ABNORMAL LOW (ref 78.0–100.0)
Monocytes Absolute: 0.4 10*3/uL (ref 0.1–1.0)
Monocytes Relative: 4 %
NEUTROS PCT: 87 %
Neutro Abs: 8.4 10*3/uL — ABNORMAL HIGH (ref 1.7–7.7)
Platelets: 439 10*3/uL — ABNORMAL HIGH (ref 150–400)
RBC: 5.04 MIL/uL (ref 3.87–5.11)
RDW: 17.6 % — ABNORMAL HIGH (ref 11.5–15.5)
WBC: 9.6 10*3/uL (ref 4.0–10.5)

## 2018-08-17 LAB — I-STAT CG4 LACTIC ACID, ED: LACTIC ACID, VENOUS: 1.92 mmol/L — AB (ref 0.5–1.9)

## 2018-08-17 LAB — LIPASE, BLOOD: Lipase: 23 U/L (ref 11–51)

## 2018-08-17 MED ORDER — ONDANSETRON HCL 4 MG/2ML IJ SOLN
4.0000 mg | Freq: Once | INTRAMUSCULAR | Status: AC
  Administered 2018-08-18: 4 mg via INTRAVENOUS
  Filled 2018-08-17: qty 2

## 2018-08-17 MED ORDER — ONDANSETRON HCL 4 MG/2ML IJ SOLN
4.0000 mg | Freq: Once | INTRAMUSCULAR | Status: AC
  Administered 2018-08-17: 4 mg via INTRAVENOUS
  Filled 2018-08-17: qty 2

## 2018-08-17 MED ORDER — SODIUM CHLORIDE 0.9 % IV BOLUS
1000.0000 mL | Freq: Once | INTRAVENOUS | Status: AC
  Administered 2018-08-17: 1000 mL via INTRAVENOUS

## 2018-08-17 NOTE — ED Triage Notes (Signed)
Pt arrived via [facility] with complaints of difficulty swallowing, states she tries to eat and has to spit back up. Reports spitting up green mucus, uncertain as to when it started. Reports generalized weakness. Currently in A-fib.

## 2018-08-17 NOTE — ED Notes (Signed)
Bed: PB35 Expected date:  Expected time:  Means of arrival:  Comments: EMS 84 yo female abdominal pain-unable to eat for multiple days-coughing up green sputum MP atrial fib with hx

## 2018-08-17 NOTE — ED Provider Notes (Signed)
[facility] DEPT Provider Note   CSN: [id number] Arrival date & time: 08/17/18  2114     History   Chief Complaint Chief Complaint  Patient presents with  . Dysphagia    HPI [name] is a 84 y.o. female.  84 yo F with a cc of difficulty swallowing.  The patient states that she tries to swallow she feels sick and she vomits that up.  Describes it is green in color.  She denies cough or congestion.  Is been going on for the past couple weeks for her.  Says they changed nurses over tonight and the nurse this evening decided that she needed to come to the hospital.  She is describing diffuse abdominal pain as well.  Denies diarrhea.  Denies sick contacts.  Denies fevers.  The history is provided by the patient.  Illness  This is a new problem. The current episode started yesterday. The problem occurs constantly. The problem has not changed since onset.Pertinent negatives include no chest pain, no headaches and no shortness of breath. Nothing aggravates the symptoms. Nothing relieves the symptoms. She has tried nothing for the symptoms. The treatment provided no relief.    Past Medical History:  Diagnosis Date  . Anemia   . Arthritis   . Atrial fibrillation ([place])   . Blood clot in vein   . Coronary artery disease   . DVT (deep venous thrombosis) ([place])   . GERD (gastroesophageal reflux disease)   . Hyperlipidemia   . Hypertension   . Mesenteric ischemia   . Stroke [facility])     Patient Active Problem List   Diagnosis Date Noted  . Acute renal failure superimposed on stage 4 chronic kidney disease ([place])   . Non-cardiac chest pain 07/04/2017  . Aortic atherosclerosis ([place]) 07/04/2017  . AKI (acute kidney injury) ([place]) 07/04/2017  . Hypothyroidism 07/04/2017  . History of atrial flutter 04/19/2013  . CAD (coronary artery disease) 04/19/2013  . Dizziness 04/07/2012    Class: Acute  . PAD (peripheral artery disease) ([place]) 07/07/2011  .  Weight loss 06/01/2011  . Wears dentures 06/01/2011  . Hypertension 06/01/2011  . Heart disease 06/01/2011  . Hyperlipidemia 06/01/2011  . Abdominal pain 06/01/2011  . Microcytic anemia 06/01/2011  . Glaucoma or retinopathy 06/01/2011  . Arthritis 06/01/2011  . Family history of breast cancer 06/01/2011    Past Surgical History:  Procedure Laterality Date  . abdominal aortogram    . ABDOMINAL HYSTERECTOMY  1964  . CHOLECYSTECTOMY    . RT AKA    . thrombectomy of superior mesenteric artery       OB History   None      Home Medications    Prior to Admission medications   Medication Sig Start Date End Date Taking? Authorizing Provider  acetaminophen (TYLENOL) 325 MG tablet Take 650 mg by mouth every 6 (six) hours as needed for mild pain or headache.     [provider]  amiodarone (PACERONE) 100 MG tablet Take 100 mg by mouth daily.    [provider]  aspirin EC 81 MG EC tablet Take 1 tablet (81 mg total) by mouth daily. 07/06/17   Thomasene Ripple, MD  atorvastatin (LIPITOR) 10 MG tablet Take 10 mg by mouth daily.      [provider]  calcium carbonate (OSCAL) 1500 (600 Ca) MG TABS tablet Take 600 mg of elemental calcium by mouth every evening.    [provider]  Coenzyme Q10  100 MG capsule Take 200 mg by mouth daily.     [provider]  feeding supplement, ENSURE ENLIVE, (ENSURE ENLIVE) LIQD Take 237 mLs by mouth daily.    [provider]  furosemide (LASIX) 40 MG tablet Take 40 mg by mouth daily.    [provider]  levothyroxine (SYNTHROID, LEVOTHROID) 100 MCG tablet Take 88 mcg by mouth daily before breakfast.     [provider]  lidocaine (LIDODERM) 5 % Place 1 patch onto the skin daily. Remove & Discard patch within 12 hours or as directed by MD    [provider]  Menthol, Topical Analgesic, (BIOFREEZE ROLL-ON COLORLESS) 4 % GEL Apply 1 application topically 2 (two) times daily.     [provider]  omeprazole (PRILOSEC) 20 MG capsule Take 20 mg by mouth every evening.     [provider]  oxyCODONE-acetaminophen (PERCOCET/ROXICET) 5-325 MG tablet Take 1 tablet by mouth every 6 (six) hours as needed for severe pain. 02/02/18   Mackuen, Courteney Lyn, MD  potassium chloride (K-DUR,KLOR-CON) 10 MEQ tablet Take 10 mEq by mouth daily.      [provider]  pravastatin (PRAVACHOL) 40 MG tablet Take 40 mg by mouth daily.     [provider]  ranitidine (ZANTAC) 75 MG tablet Take 75 mg by mouth at bedtime.    [provider]  Vitamin D, Ergocalciferol, (DRISDOL) 50000 units CAPS capsule Take 50,000 Units by mouth every 7 (seven) days. SATURDAY    [provider]    Family History Family History  Problem Relation Age of Onset  . Hypertension Sister   . Cancer Brother   . Diabetes Paternal Aunt     Social History Social History   Tobacco Use  . Smoking status: Never Smoker  . Smokeless tobacco: Former Systems developer    Types: Snuff  Substance Use Topics  . Alcohol use: No  . Drug use: No     Allergies   Morphine and related and Penicillins   Review of Systems Review of Systems  Constitutional: Negative for chills and fever.  HENT: Negative for congestion and rhinorrhea.   Eyes: Negative for redness and visual disturbance.  Respiratory: Negative for shortness of breath and wheezing.   Cardiovascular: Negative for chest pain and palpitations.  Gastrointestinal: Positive for nausea and vomiting.  Genitourinary: Negative for dysuria and urgency.  Musculoskeletal: Negative for arthralgias and myalgias.  Skin: Negative for pallor and wound.  Neurological: Negative for dizziness and headaches.     Physical Exam Updated Vital Signs BP (!) 143/77   Pulse 86   Temp (!) 97.5 F (36.4 C) (Oral)   Resp 20   LMP 04/20/2018 (Exact Date)   SpO2 99%   Physical Exam  Constitutional: She is oriented to person, place, and  time. She appears well-developed and well-nourished. No distress.  HENT:  Head: Normocephalic and atraumatic.  Eyes: Pupils are equal, round, and reactive to light. EOM are normal.  Neck: Normal range of motion. Neck supple.  Cardiovascular: Normal rate and regular rhythm. Exam reveals no gallop and no friction rub.  No murmur heard. Pulmonary/Chest: Effort normal. She has no wheezes. She has no rales.  Abdominal: Soft. She exhibits no distension and no mass. There is no tenderness. There is no guarding.  Musculoskeletal: She exhibits no edema or tenderness.  Neurological: She is alert and oriented to person, place, and time.  Skin: Skin is warm and dry. She is not diaphoretic.  Psychiatric: She  has a normal mood and affect. Her behavior is normal.  Nursing note and vitals reviewed.    ED Treatments / Results  Labs (all labs ordered are listed, but only abnormal results are displayed) Labs Reviewed  CBC WITH DIFFERENTIAL/PLATELET - Abnormal; Notable for the following components:      Result Value   MCV 73.0 (*)    MCH 25.4 (*)    RDW 17.6 (*)    Platelets 439 (*)    Neutro Abs 8.4 (*)    All other components within normal limits  COMPREHENSIVE METABOLIC PANEL - Abnormal; Notable for the following components:   Sodium 133 (*)    Chloride 90 (*)    Glucose, Bld 115 (*)    BUN 46 (*)    Creatinine, Ser 1.81 (*)    Calcium 8.4 (*)    Albumin 2.5 (*)    GFR calc non Af Amer 23 (*)    GFR calc Af Amer 27 (*)    Anion gap 16 (*)    All other components within normal limits  I-STAT CHEM 8, ED - Abnormal; Notable for the following components:   Sodium 129 (*)    Chloride 92 (*)    BUN 45 (*)    Creatinine, Ser 2.00 (*)    Glucose, Bld 112 (*)    Calcium, Ion 1.01 (*)    All other components within normal limits  I-STAT CG4 LACTIC ACID, ED - Abnormal; Notable for the following components:   Lactic Acid, Venous 1.92 (*)    All other components within normal limits  LIPASE,  BLOOD  URINALYSIS, ROUTINE W REFLEX MICROSCOPIC    EKG None  Radiology Ct Abdomen Pelvis Wo Contrast  Result Date: 08/17/2018 CLINICAL DATA:  Nausea and vomiting.  Generalized weakness. EXAM: CT ABDOMEN AND PELVIS WITHOUT CONTRAST TECHNIQUE: Multidetector CT imaging of the abdomen and pelvis was performed following the standard protocol without IV contrast. COMPARISON:  Contrast-enhanced exam 05/30/2018 FINDINGS: Lower chest: Calcified right hilar nodes. Moderate cardiomegaly with coronary artery calcifications. Small right and trace left pleural effusion. Basilar septal thickening suspicious for pulmonary edema. Hepatobiliary: No focal hepatic lesion. Post cholecystectomy. Common bile duct not well visualized on noncontrast exam. Pancreas: Parenchymal atrophy. No ductal dilatation or inflammation. Spleen: Small in size. Adrenals/Urinary Tract: No adrenal nodule. Lobular bilateral renal contours without hydronephrosis. Urinary bladder near completely empty. Stomach/Bowel: Gastric distention with fluid and ingested contents. Diffuse fluid-filled small bowel dilatation with transition point in the right upper quadrant image 38 series 2 and image 35 series 5. More distal small bowel nondistended. There is diffuse mesenteric stranding. No pneumatosis. Colon is nondistended with small volume of colonic stool. Diverticulosis of the descending and sigmoid colon. Vascular/Lymphatic: Dense aortic and branch atherosclerosis. No aneurysm. Limited assessment for adenopathy. Reproductive: Post hysterectomy.  No gross adnexal mass. Other: Moderate volume of ascites with free fluid in the pelvis, right pericolic gutter and right upper quadrant. No free air. Musculoskeletal: Scoliosis and multilevel degenerative change in the spine. The bones are under mineralized. Diffuse body wall edema, increased from prior exam. IMPRESSION: 1. Small bowel obstruction transition point in the right upper quadrant, possibly due to  adhesions. 2. Moderate volume abdominopelvic ascites. This may be reactive in the setting of bowel obstruction, however there is also body wall edema, right pleural effusion, cardiomegaly and possible mild pulmonary edema at the bases raising concern for fluid overload. 3. Colonic diverticulosis without diverticulitis. 4. Advanced Aortic Atherosclerosis (ICD10-I70.0). Electronically Signed   By:  Keith Rake M.D.   On: 08/17/2018 23:52   Dg Chest Port 1 View  Result Date: 08/17/2018 CLINICAL DATA:  Cough, difficulty swallowing EXAM: PORTABLE CHEST 1 VIEW COMPARISON:  Radiograph 12/08/2017 FINDINGS: Cardiomegaly. Atelectasis or scar at the right base. No focal consolidation or pleural effusion. Aortic atherosclerosis. No pneumothorax. Advanced degenerative change of the right shoulder. Clips in the right upper quadrant. IMPRESSION: No active disease. Cardiomegaly with atelectasis or scar at the right base Electronically Signed   By: Donavan Foil M.D.   On: 08/17/2018 22:32    Procedures Procedures (including critical care time)  Medications Ordered in ED Medications  ondansetron (ZOFRAN) injection 4 mg (has no administration in time range)  sodium chloride 0.9 % bolus 1,000 mL (1,000 mLs Intravenous New Bag/Given 08/17/18 2256)  ondansetron (ZOFRAN) injection 4 mg (4 mg Intravenous Given 08/17/18 2256)     Initial Impression / Assessment and Plan / ED Course  I have reviewed the triage vital signs and the nursing notes.  Pertinent labs & imaging results that were available during my care of the patient were reviewed by me and considered in my medical decision making (see chart for details).     84 yo F with a chief complaint of abdominal pain nausea and vomiting.  Is been going on for the past 2 weeks per the patient.  She is actively vomiting on the in the room.  The initial complaints was difficulty swallowing though I wonder if this is just a sensation of difficulty swallowing with the  need to vomit.    Give IV fluids.  Nausea medicine and reassess.  Patient creatinine elevated from baseline, will obtain CT scan without contrast.   CT scan is concerning for a small bowel obstruction.  She has adhesions in the right upper quadrant.  I discussed the case with Dr. Lucia Gaskins, general surgery he will come and evaluate the patient in the morning.  Recommended an NG tube this time.  Will discuss with the hospitalist.  The patients results and plan were reviewed and discussed.   Any x-rays performed were independently reviewed by myself.   Differential diagnosis were considered with the presenting HPI.  Medications  ondansetron (ZOFRAN) injection 4 mg (has no administration in time range)  sodium chloride 0.9 % bolus 1,000 mL (1,000 mLs Intravenous New Bag/Given 08/17/18 2256)  ondansetron (ZOFRAN) injection 4 mg (4 mg Intravenous Given 08/17/18 2256)    Vitals:   08/17/18 2129 08/17/18 2159 08/17/18 2300  BP:  99/77 (!) 143/77  Pulse:  85 86  Resp:  (!) 22 20  Temp: (!) 97.5 F (36.4 C)    TempSrc: Oral    SpO2:  99% 99%    Final diagnoses:  SBO (small bowel obstruction) (HCC)    Admission/ observation were discussed with the admitting physician, patient and/or family and they are comfortable with the plan.    Final Clinical Impressions(s) / ED Diagnoses   Final diagnoses:  SBO (small bowel obstruction) [facility])    ED Discharge Orders    None       Deno Etienne, DO 08/18/18 0005

## 2018-08-17 NOTE — ED Notes (Signed)
Patient transported to CT 

## 2018-08-18 ENCOUNTER — Inpatient Hospital Stay (HOSPITAL_COMMUNITY): Payer: Medicare (Managed Care)

## 2018-08-18 ENCOUNTER — Encounter (HOSPITAL_COMMUNITY): Payer: Self-pay | Admitting: *Deleted

## 2018-08-18 ENCOUNTER — Other Ambulatory Visit: Payer: Self-pay

## 2018-08-18 DIAGNOSIS — N184 Chronic kidney disease, stage 4 (severe): Secondary | ICD-10-CM | POA: Diagnosis present

## 2018-08-18 DIAGNOSIS — E785 Hyperlipidemia, unspecified: Secondary | ICD-10-CM | POA: Diagnosis present

## 2018-08-18 DIAGNOSIS — N390 Urinary tract infection, site not specified: Secondary | ICD-10-CM | POA: Diagnosis present

## 2018-08-18 DIAGNOSIS — R1310 Dysphagia, unspecified: Secondary | ICD-10-CM | POA: Diagnosis present

## 2018-08-18 DIAGNOSIS — E872 Acidosis, unspecified: Secondary | ICD-10-CM | POA: Diagnosis present

## 2018-08-18 DIAGNOSIS — K56609 Unspecified intestinal obstruction, unspecified as to partial versus complete obstruction: Secondary | ICD-10-CM | POA: Diagnosis not present

## 2018-08-18 DIAGNOSIS — M1990 Unspecified osteoarthritis, unspecified site: Secondary | ICD-10-CM | POA: Diagnosis present

## 2018-08-18 DIAGNOSIS — I10 Essential (primary) hypertension: Secondary | ICD-10-CM

## 2018-08-18 DIAGNOSIS — L89609 Pressure ulcer of unspecified heel, unspecified stage: Secondary | ICD-10-CM | POA: Diagnosis present

## 2018-08-18 DIAGNOSIS — B961 Klebsiella pneumoniae [K. pneumoniae] as the cause of diseases classified elsewhere: Secondary | ICD-10-CM | POA: Diagnosis present

## 2018-08-18 DIAGNOSIS — E860 Dehydration: Secondary | ICD-10-CM | POA: Diagnosis present

## 2018-08-18 DIAGNOSIS — R188 Other ascites: Secondary | ICD-10-CM | POA: Diagnosis present

## 2018-08-18 DIAGNOSIS — I2510 Atherosclerotic heart disease of native coronary artery without angina pectoris: Secondary | ICD-10-CM | POA: Diagnosis present

## 2018-08-18 DIAGNOSIS — E876 Hypokalemia: Secondary | ICD-10-CM | POA: Diagnosis not present

## 2018-08-18 DIAGNOSIS — E870 Hyperosmolality and hypernatremia: Secondary | ICD-10-CM | POA: Diagnosis not present

## 2018-08-18 DIAGNOSIS — M24412 Recurrent dislocation, left shoulder: Secondary | ICD-10-CM | POA: Diagnosis present

## 2018-08-18 DIAGNOSIS — J9811 Atelectasis: Secondary | ICD-10-CM | POA: Diagnosis present

## 2018-08-18 DIAGNOSIS — K219 Gastro-esophageal reflux disease without esophagitis: Secondary | ICD-10-CM | POA: Diagnosis present

## 2018-08-18 DIAGNOSIS — L97409 Non-pressure chronic ulcer of unspecified heel and midfoot with unspecified severity: Secondary | ICD-10-CM

## 2018-08-18 DIAGNOSIS — L89621 Pressure ulcer of left heel, stage 1: Secondary | ICD-10-CM

## 2018-08-18 DIAGNOSIS — I739 Peripheral vascular disease, unspecified: Secondary | ICD-10-CM | POA: Diagnosis present

## 2018-08-18 DIAGNOSIS — L89622 Pressure ulcer of left heel, stage 2: Secondary | ICD-10-CM | POA: Diagnosis present

## 2018-08-18 DIAGNOSIS — I4891 Unspecified atrial fibrillation: Secondary | ICD-10-CM | POA: Diagnosis present

## 2018-08-18 DIAGNOSIS — E440 Moderate protein-calorie malnutrition: Secondary | ICD-10-CM | POA: Diagnosis present

## 2018-08-18 DIAGNOSIS — L89152 Pressure ulcer of sacral region, stage 2: Secondary | ICD-10-CM | POA: Diagnosis present

## 2018-08-18 DIAGNOSIS — D631 Anemia in chronic kidney disease: Secondary | ICD-10-CM | POA: Diagnosis present

## 2018-08-18 DIAGNOSIS — Z89611 Acquired absence of right leg above knee: Secondary | ICD-10-CM | POA: Diagnosis not present

## 2018-08-18 DIAGNOSIS — Z681 Body mass index (BMI) 19 or less, adult: Secondary | ICD-10-CM | POA: Diagnosis not present

## 2018-08-18 LAB — GLUCOSE, CAPILLARY
GLUCOSE-CAPILLARY: 91 mg/dL (ref 70–99)
Glucose-Capillary: 100 mg/dL — ABNORMAL HIGH (ref 70–99)
Glucose-Capillary: 76 mg/dL (ref 70–99)

## 2018-08-18 LAB — BASIC METABOLIC PANEL
Anion gap: 12 (ref 5–15)
BUN: 47 mg/dL — AB (ref 8–23)
CHLORIDE: 93 mmol/L — AB (ref 98–111)
CO2: 27 mmol/L (ref 22–32)
Calcium: 7.6 mg/dL — ABNORMAL LOW (ref 8.9–10.3)
Creatinine, Ser: 1.74 mg/dL — ABNORMAL HIGH (ref 0.44–1.00)
GFR calc Af Amer: 28 mL/min — ABNORMAL LOW (ref >60)
GFR, EST NON AFRICAN AMERICAN: 24 mL/min — AB (ref >60)
GLUCOSE: 99 mg/dL (ref 70–99)
POTASSIUM: 3.6 mmol/L (ref 3.5–5.1)
SODIUM: 132 mmol/L — AB (ref 135–145)

## 2018-08-18 LAB — URINALYSIS, ROUTINE W REFLEX MICROSCOPIC
BILIRUBIN URINE: NEGATIVE
Glucose, UA: NEGATIVE mg/dL
Hgb urine dipstick: NEGATIVE
Ketones, ur: 5 mg/dL — AB
NITRITE: NEGATIVE
PH: 6 (ref 5.0–8.0)
Protein, ur: 100 mg/dL — AB
SPECIFIC GRAVITY, URINE: 1.011 (ref 1.005–1.030)

## 2018-08-18 LAB — CBC
HCT: 30.4 % — ABNORMAL LOW (ref 36.0–46.0)
Hemoglobin: 10.5 g/dL — ABNORMAL LOW (ref 12.0–15.0)
MCH: 24.7 pg — AB (ref 26.0–34.0)
MCHC: 34.5 g/dL (ref 30.0–36.0)
MCV: 71.5 fL — AB (ref 78.0–100.0)
PLATELETS: 436 10*3/uL — AB (ref 150–400)
RBC: 4.25 MIL/uL (ref 3.87–5.11)
RDW: 17.4 % — ABNORMAL HIGH (ref 11.5–15.5)
WBC: 9.1 10*3/uL (ref 4.0–10.5)

## 2018-08-18 LAB — LACTIC ACID, PLASMA
LACTIC ACID, VENOUS: 1.1 mmol/L (ref 0.5–1.9)
Lactic Acid, Venous: 1 mmol/L (ref 0.5–1.9)

## 2018-08-18 LAB — I-STAT CG4 LACTIC ACID, ED: LACTIC ACID, VENOUS: 1.13 mmol/L (ref 0.5–1.9)

## 2018-08-18 MED ORDER — DIATRIZOATE MEGLUMINE & SODIUM 66-10 % PO SOLN
90.0000 mL | Freq: Once | ORAL | Status: AC
  Administered 2018-08-18: 90 mL via NASOGASTRIC
  Filled 2018-08-18: qty 90

## 2018-08-18 MED ORDER — SODIUM CHLORIDE 0.9 % IV SOLN
1.0000 g | Freq: Every day | INTRAVENOUS | Status: DC
  Administered 2018-08-18 – 2018-08-19 (×3): 1 g via INTRAVENOUS
  Filled 2018-08-18: qty 1
  Filled 2018-08-18 (×2): qty 10

## 2018-08-18 MED ORDER — FAMOTIDINE IN NACL 20-0.9 MG/50ML-% IV SOLN
20.0000 mg | Freq: Two times a day (BID) | INTRAVENOUS | Status: DC
  Administered 2018-08-18 – 2018-08-19 (×5): 20 mg via INTRAVENOUS
  Filled 2018-08-18 (×5): qty 50

## 2018-08-18 MED ORDER — SODIUM CHLORIDE 0.9 % IV SOLN
INTRAVENOUS | Status: DC
  Administered 2018-08-18 – 2018-08-19 (×3): via INTRAVENOUS

## 2018-08-18 MED ORDER — ACETAMINOPHEN 325 MG PO TABS: 650.0000 mg | ORAL_TABLET | Freq: Four times a day (QID) | ORAL | Status: DC | PRN

## 2018-08-18 MED ORDER — ONDANSETRON HCL 4 MG/2ML IJ SOLN: 4.0000 mg | Freq: Four times a day (QID) | INTRAMUSCULAR | Status: DC | PRN

## 2018-08-18 MED ORDER — ONDANSETRON HCL 4 MG PO TABS: 4.0000 mg | ORAL_TABLET | Freq: Four times a day (QID) | ORAL | Status: DC | PRN

## 2018-08-18 MED ORDER — ACETAMINOPHEN 650 MG RE SUPP: 650.0000 mg | Freq: Four times a day (QID) | RECTAL | Status: DC | PRN

## 2018-08-18 MED ORDER — FENTANYL CITRATE (PF) 100 MCG/2ML IJ SOLN: 12.5000 ug | INTRAMUSCULAR | Status: DC | PRN

## 2018-08-18 MED ORDER — SODIUM CHLORIDE 0.9% FLUSH
3.0000 mL | Freq: Two times a day (BID) | INTRAVENOUS | Status: DC
  Administered 2018-08-18 – 2018-08-22 (×5): 3 mL via INTRAVENOUS

## 2018-08-18 MED ORDER — HEPARIN SODIUM (PORCINE) 5000 UNIT/ML IJ SOLN
5000.0000 [IU] | Freq: Three times a day (TID) | INTRAMUSCULAR | Status: DC
  Administered 2018-08-18 – 2018-08-22 (×14): 5000 [IU] via SUBCUTANEOUS
  Filled 2018-08-18 (×14): qty 1

## 2018-08-18 MED ORDER — LIDOCAINE HCL URETHRAL/MUCOSAL 2 % EX GEL
1.0000 "application " | Freq: Once | CUTANEOUS | Status: AC
  Administered 2018-08-18: 1
  Filled 2018-08-18: qty 5

## 2018-08-18 NOTE — Consult Note (Addendum)
Re:   [name] DOB:   January 15, 1925 MRN:   [id number]  Chief Complaint Abdominal pain  ASSESEMENT AND PLAN: 1.  SBO  Agree with NGT, IVF, and follow up PE.  Will order KUB for the AM.  2.  A. Fib 3.  HTN 4.  History of stroke - without deficit 5.  Right AKA 6.  Mesenteric ischemia s/p bypass   In 2012 7.  Pressure ulcer on left heel and left shoulder  Chief Complaint  Patient presents with  . Dysphagia   PHYSICIAN REQUESTING CONSULTATION: Ellin Mayhew, MD, Karenann Cai  HISTORY OF PRESENT ILLNESS: [name] is a 84 y.o. (DOB: Jan 12, 1925)  AA female whose primary care physician is Janifer Adie, MD.   She has had abdominal pain and vomiting.  She said that she has had trouble eating for more than a month.  She is by herself - she is not the best historian.  She had an open cholecystectomy, thrombectomy of superior mesenteric artery, bovine pericardial patch, superior mesenteric artery, aorta to superior mesenteric artery bypass with 6-mm Dacron on 06/13/2011 by Drs. Fields/Ingram.      There is no family at the bedside.  CT scan - 08/18/2018 - 1. Small bowel obstruction transition point in the right upper quadrant, possibly due to adhesions.  2. Moderate volume abdominopelvic ascites. This may be reactive in the setting of bowel obstruction, however there is also body wall edema, right pleural effusion, cardiomegaly and possible mild pulmonary edema at the bases raising concern for fluid overload.  3. Colonic diverticulosis without diverticulitis.    Past Medical History:  Diagnosis Date  . Anemia   . Arthritis   . Atrial fibrillation ([place])   . Blood clot in vein   . Coronary artery disease   . DVT (deep venous thrombosis) ([place])   . GERD (gastroesophageal reflux disease)   . Hyperlipidemia   . Hypertension   . Mesenteric ischemia   . Stroke [facility])       Past Surgical History:  Procedure Laterality Date  . abdominal aortogram    . ABDOMINAL HYSTERECTOMY   1964  . CHOLECYSTECTOMY    . RT AKA    . thrombectomy of superior mesenteric artery        Current Facility-Administered Medications  Medication Dose Route Frequency Provider Last Rate Last Dose  . 0.9 %  sodium chloride infusion   Intravenous Continuous Fuller Plan A, MD 75 mL/hr at 08/18/18 0532    . acetaminophen (TYLENOL) tablet 650 mg  650 mg Oral Q6H PRN Norval Morton, MD       Or  . acetaminophen (TYLENOL) suppository 650 mg  650 mg Rectal Q6H PRN Tamala Julian, Rondell A, MD      . cefTRIAXone (ROCEPHIN) 1 g in sodium chloride 0.9 % 100 mL IVPB  1 g Intravenous QHS Smith, Rondell A, MD 200 mL/hr at 08/18/18 0531 1 g at 08/18/18 0531  . famotidine (PEPCID) IVPB 20 mg premix  20 mg Intravenous Q12H Smith, Rondell A, MD 100 mL/hr at 08/18/18 0937 20 mg at 08/18/18 0937  . fentaNYL (SUBLIMAZE) injection 12.5-25 mcg  12.5-25 mcg Intravenous Q2H PRN Fuller Plan A, MD      . heparin injection 5,000 Units  5,000 Units Subcutaneous Q8H Fuller Plan A, MD   5,000 Units at 08/18/18 0517  . ondansetron (ZOFRAN) tablet 4 mg  4 mg Oral Q6H PRN Norval Morton, MD       Or  .  ondansetron (ZOFRAN) injection 4 mg  4 mg Intravenous Q6H PRN Smith, Rondell A, MD      . sodium chloride flush (NS) 0.9 % injection 3 mL  3 mL Intravenous Q12H Smith, Rondell A, MD          Allergies  Allergen Reactions  . Morphine And Related Anaphylaxis  . Penicillins Other (See Comments)    Makes patient sick Has patient had a PCN reaction causing immediate rash, facial/tongue/throat swelling, SOB or lightheadedness with hypotension: No Has patient had a PCN reaction causing severe rash involving mucus membranes or skin necrosis: No Has patient had a PCN reaction that required hospitalization: No Has patient had a PCN reaction occurring within the last 10 years: No If all of the above answers are "NO", then may proceed with Cephalosporin use.     REVIEW OF SYSTEMS: Skin:  No history of rash.  No history of  abnormal moles. Infection:  No history of hepatitis or HIV.  No history of MRSA. Neurologic:  History of a stroke - but she is uncertain of date Cardiac:  History of A fib Pulmonary:  Does not smoke cigarettes.  No asthma or bronchitis.  No OSA/CPAP.  Endocrine:  No diabetes. No thyroid disease. Gastrointestinal:  See HPI. Urologic:  No history of kidney stones.  No history of bladder infections. Musculoskeletal:  Right AKA Hematologic:  No bleeding disorder.  No history of anemia.  Not anticoagulated. Psycho-social:  The patient is oriented.  But not a great historian.  SOCIAL and FAMILY HISTORY: She lives with her son and daughter in law - [name].  PHYSICAL EXAM: BP (!) 139/98 (BP Location: Left Arm)   Pulse 82   Temp 98.9 F (37.2 C) (Oral)   Resp 16   Wt 53.1 kg   LMP 04/20/2018 (Exact Date)   SpO2 98%   BMI 18.90 kg/m   General: Old AA F who is alert.    She gives some of her history but is very fuzzy on the details.  She has an NGT Skin:  Inspection and palpation - no mass or rash. Eyes:  Conjunctiva and lids unremarkable.            Pupils are equal Ears, Nose, Mouth, and Throat:  Ears and nose unremarkable            Lips and teeth are unremarable. Neck: Supple. No mass, trachea midline.  No thyroid mass. Lymph Nodes:  No supraclavicular, cervical, or inguinal nodes. Lungs: Normal respiratory effort.  Clear to auscultation and symmetric breath sounds. Heart:  Palpation of the heart is normal.            Auscultation: RRR. No murmur or rub.  Abdomen: Soft.  No tenderness. No hernia.             Few BS.  Long midline incision. Rectal: Not done. Musculoskeletal:  Right AKA  Neurologic:  Grossly intact to motor and sensory function. Psychiatric: She is talking  DATA REVIEWED, COUNSELING AND COORDINATION OF CARE: Epic notes reviewed. Counseling and coordination of care exceeded more than 50% of the time spent with patient. Total time spent with  patient and charting: 45 minutes.  Alphonsa Overall, MD,  [facility], [address]  [place], [place]    [place] Phone:  [phone] FAX:  [phone]

## 2018-08-18 NOTE — Progress Notes (Signed)
Patient has not voided this shift, bladder scan performed, read as 257.  Patient denies any urge to void.  Purewick placed.  Will continue to monitor for urine output.

## 2018-08-18 NOTE — Progress Notes (Signed)
Patient ID: [name], female   DOB: October 19, 1925, 84 y.o.   MRN: [id number] Patient was admitted early this morning for bowel obstruction.  General surgery has been consulted.  Patient seen and examined at bedside and plan of care discussed with her and her daughter at bedside.  I have reviewed patient's H&P and prior medical records myself.  Continue n.p.o., IV fluids.  Follow general surgery recommendations.  Repeat a.m. labs

## 2018-08-18 NOTE — H&P (Signed)
History and Physical    [name] YQI:[id number] DOB: 1925/10/10 DOA: 08/17/2018  Referring MD/NP/PA: Deno Etienne, MD PCP: Janifer Adie, MD  Patient coming from: Via EMS  Chief Complaint: Abdominal pain with nausea, and vomiting  I have personally briefly reviewed patient's old medical records in [place]   HPI: [name] is a 84 y.o. female with medical history significant of HTN, HLD, A. fib, DVT, P PAD, mesenteric ischemia s/p bypass, s/p right AKA, and anemia; who presents with complaints of abdominal pain with nausea and vomiting over the last 7 days.  She points to the middle of her stomach when asked where the pain is.  She had been having limited p.o. intake and yesterday reports being unable to keep any no acute amount of food or liquids down.  Emesis was noted to be nonbloody in appearance.  Last bowel movement was loose and reported to be 3-4 days ago.  She still able to pass flatus.  Previous surgeries include bypass surgery in 2012 and hysterectomy.  At baseline patient bedbound and family report utilizing a Hoyer lift at home.  Other associated symptoms include a pressure ulcer of the left heel and left shoulder dislocation.  Patient denies having any shortness of breath, chest pain, or bleeding.   ED Course: Upon admission to the emergency department patient was seen to be afebrile with vital signs relatively within normal limits.  Labs revealed WBC 9.6, hemoglobin 12.8, platelets 439, sodium 133, BUN 46, creatinine 1.81, and lactic acid 1.92.  Chest x-ray showed cardiomegaly with atelectasis.  CT scan of the abdomen and pelvis revealed signs of small bowel obstruction with signs of possible fluid overload.  General surgery was consulted and NGT to be placed to suction.  TRH called to admit for small bowel obstruction.   Review of Systems  Constitutional: Positive for malaise/fatigue. Negative for chills and fever.  HENT: Negative for ear discharge and  nosebleeds.   Eyes: Negative for pain and redness.  Respiratory: Positive for sputum production. Negative for shortness of breath.   Cardiovascular: Negative for chest pain and leg swelling.  Gastrointestinal: Positive for abdominal pain, constipation, nausea and vomiting. Negative for diarrhea.  Genitourinary: Negative for dysuria and hematuria.  Musculoskeletal: Positive for joint pain and myalgias.  Skin: Negative for itching and rash.  Neurological: Positive for weakness. Negative for focal weakness and loss of consciousness.  Psychiatric/Behavioral: Negative for substance abuse and suicidal ideas.    Past Medical History:  Diagnosis Date  . Anemia   . Arthritis   . Atrial fibrillation ([place])   . Blood clot in vein   . Coronary artery disease   . DVT (deep venous thrombosis) ([place])   . GERD (gastroesophageal reflux disease)   . Hyperlipidemia   . Hypertension   . Mesenteric ischemia   . Stroke [facility])     Past Surgical History:  Procedure Laterality Date  . abdominal aortogram    . ABDOMINAL HYSTERECTOMY  1964  . CHOLECYSTECTOMY    . RT AKA    . thrombectomy of superior mesenteric artery       reports that she has never smoked. She quit smokeless tobacco use about 50 years ago.  Her smokeless tobacco use included snuff. She reports that she does not drink alcohol or use drugs.  Allergies  Allergen Reactions  . Morphine And Related Anaphylaxis  . Penicillins Other (See Comments)    Makes patient sick    Family History  Problem  Relation Age of Onset  . Hypertension Sister   . Cancer Brother   . Diabetes Paternal Aunt     Prior to Admission medications   Medication Sig Start Date End Date Taking? Authorizing Provider  acetaminophen (TYLENOL) 325 MG tablet Take 650 mg by mouth every 6 (six) hours as needed for mild pain or headache.     [provider]  amiodarone (PACERONE) 100 MG tablet Take 100 mg by mouth daily.    [provider]  aspirin EC  81 MG EC tablet Take 1 tablet (81 mg total) by mouth daily. 07/06/17   Thomasene Ripple, MD  atorvastatin (LIPITOR) 10 MG tablet Take 10 mg by mouth daily.      [provider]  calcium carbonate (OSCAL) 1500 (600 Ca) MG TABS tablet Take 600 mg of elemental calcium by mouth every evening.    [provider]  Coenzyme Q10 100 MG capsule Take 200 mg by mouth daily.     [provider]  feeding supplement, ENSURE ENLIVE, (ENSURE ENLIVE) LIQD Take 237 mLs by mouth daily.    [provider]  furosemide (LASIX) 40 MG tablet Take 40 mg by mouth daily.    [provider]  levothyroxine (SYNTHROID, LEVOTHROID) 100 MCG tablet Take 88 mcg by mouth daily before breakfast.     [provider]  lidocaine (LIDODERM) 5 % Place 1 patch onto the skin daily. Remove & Discard patch within 12 hours or as directed by MD    [provider]  Menthol, Topical Analgesic, (BIOFREEZE ROLL-ON COLORLESS) 4 % GEL Apply 1 application topically 2 (two) times daily.    [provider]  omeprazole (PRILOSEC) 20 MG capsule Take 20 mg by mouth every evening.     [provider]  oxyCODONE-acetaminophen (PERCOCET/ROXICET) 5-325 MG tablet Take 1 tablet by mouth every 6 (six) hours as needed for severe pain. 02/02/18   Mackuen, Courteney Lyn, MD  potassium chloride (K-DUR,KLOR-CON) 10 MEQ tablet Take 10 mEq by mouth daily.      [provider]  pravastatin (PRAVACHOL) 40 MG tablet Take 40 mg by mouth daily.     [provider]  ranitidine (ZANTAC) 75 MG tablet Take 75 mg by mouth at bedtime.    [provider]  Vitamin D, Ergocalciferol, (DRISDOL) 50000 units CAPS capsule Take 50,000 Units by mouth every 7 (seven) days. SATURDAY    [provider]    Physical Exam:  Constitutional: Elderly female who appears to be in NAD, calm, comfortable Vitals:   08/17/18 2129 08/17/18 2159 08/17/18 2300  BP:  99/77 (!) 143/77  Pulse:   85 86  Resp:  (!) 22 20  Temp: (!) 97.5 F (36.4 C)    TempSrc: Oral    SpO2:  99% 99%   Eyes: PERRL, lids and conjunctivae normal ENMT: Mucous membranes are dry. Posterior pharynx clear of any exudate or lesions.NG tube in place suctioning whitish-tan fluid. Neck: normal, supple, no masses, no thyromegaly Respiratory: clear to auscultation bilaterally, no wheezing, no crackles. Normal respiratory effort. No accessory muscle use.  Cardiovascular: Regular rate and rhythm, no murmurs / rubs / gallops. No extremity edema. 2+ pedal pulses. No carotid bruits.  Abdomen: no tenderness, no masses palpated. No hepatosplenomegaly. Bowel sounds positive.  Musculoskeletal: no clubbing / cyanosis.  Right AKA and dislocation of left shoulder. Skin: Stage I/stage II pressure ulcer of the left heel Neurologic: CN 2-12 grossly intact. Sensation intact, DTR normal. Strength 5/5 in  all 4.  Psychiatric: Normal judgment and insight. Alert and oriented x 3. Normal mood.     Labs on Admission: I have personally reviewed following labs and imaging studies  CBC: Recent Labs  Lab 08/17/18 2258 08/17/18 2304  WBC 9.6  --   NEUTROABS 8.4*  --   HGB 12.8 13.6  HCT 36.8 40.0  MCV 73.0*  --   PLT 439*  --    Basic Metabolic Panel: Recent Labs  Lab 08/17/18 2258 08/17/18 2304  NA 133* 129*  K 4.0 3.9  CL 90* 92*  CO2 27  --   GLUCOSE 115* 112*  BUN 46* 45*  CREATININE 1.81* 2.00*  CALCIUM 8.4*  --    GFR: CrCl cannot be calculated (Unknown ideal weight.). Liver Function Tests: Recent Labs  Lab 08/17/18 2258  AST 23  ALT 12  ALKPHOS 60  BILITOT 0.8  PROT 7.1  ALBUMIN 2.5*   Recent Labs  Lab 08/17/18 2258  LIPASE 23   No results for input(s): AMMONIA in the last 168 hours. Coagulation Profile: No results for input(s): INR, PROTIME in the last 168 hours. Cardiac Enzymes: No results for input(s): CKTOTAL, CKMB, CKMBINDEX, TROPONINI in the last 168 hours. BNP (last 3 results) No  results for input(s): PROBNP in the last 8760 hours. HbA1C: No results for input(s): HGBA1C in the last 72 hours. CBG: No results for input(s): GLUCAP in the last 168 hours. Lipid Profile: No results for input(s): CHOL, HDL, LDLCALC, TRIG, CHOLHDL, LDLDIRECT in the last 72 hours. Thyroid Function Tests: No results for input(s): TSH, T4TOTAL, FREET4, T3FREE, THYROIDAB in the last 72 hours. Anemia Panel: No results for input(s): VITAMINB12, FOLATE, FERRITIN, TIBC, IRON, RETICCTPCT in the last 72 hours. Urine analysis:    Component Value Date/Time   COLORURINE YELLOW 04/07/2012 1646   APPEARANCEUR CLEAR 04/07/2012 1646   LABSPEC 1.009 04/07/2012 1646   PHURINE 6.0 04/07/2012 1646   GLUCOSEU NEGATIVE 04/07/2012 1646   HGBUR TRACE (A) 04/07/2012 1646   BILIRUBINUR NEGATIVE 04/07/2012 1646   KETONESUR NEGATIVE 04/07/2012 1646   PROTEINUR 100 (A) 04/07/2012 1646   UROBILINOGEN 0.2 04/07/2012 1646   NITRITE NEGATIVE 04/07/2012 1646   LEUKOCYTESUR NEGATIVE 04/07/2012 1646   Sepsis Labs: No results found for this or any previous visit (from the past 240 hour(s)).   Radiological Exams on Admission: Ct Abdomen Pelvis Wo Contrast  Result Date: 08/17/2018 CLINICAL DATA:  Nausea and vomiting.  Generalized weakness. EXAM: CT ABDOMEN AND PELVIS WITHOUT CONTRAST TECHNIQUE: Multidetector CT imaging of the abdomen and pelvis was performed following the standard protocol without IV contrast. COMPARISON:  Contrast-enhanced exam 05/30/2018 FINDINGS: Lower chest: Calcified right hilar nodes. Moderate cardiomegaly with coronary artery calcifications. Small right and trace left pleural effusion. Basilar septal thickening suspicious for pulmonary edema. Hepatobiliary: No focal hepatic lesion. Post cholecystectomy. Common bile duct not well visualized on noncontrast exam. Pancreas: Parenchymal atrophy. No ductal dilatation or inflammation. Spleen: Small in size. Adrenals/Urinary Tract: No adrenal nodule.  Lobular bilateral renal contours without hydronephrosis. Urinary bladder near completely empty. Stomach/Bowel: Gastric distention with fluid and ingested contents. Diffuse fluid-filled small bowel dilatation with transition point in the right upper quadrant image 38 series 2 and image 35 series 5. More distal small bowel nondistended. There is diffuse mesenteric stranding. No pneumatosis. Colon is nondistended with small volume of colonic stool. Diverticulosis of the descending and sigmoid colon. Vascular/Lymphatic: Dense aortic and branch atherosclerosis. No aneurysm. Limited assessment for adenopathy. Reproductive: Post hysterectomy.  No gross  adnexal mass. Other: Moderate volume of ascites with free fluid in the pelvis, right pericolic gutter and right upper quadrant. No free air. Musculoskeletal: Scoliosis and multilevel degenerative change in the spine. The bones are under mineralized. Diffuse body wall edema, increased from prior exam. IMPRESSION: 1. Small bowel obstruction transition point in the right upper quadrant, possibly due to adhesions. 2. Moderate volume abdominopelvic ascites. This may be reactive in the setting of bowel obstruction, however there is also body wall edema, right pleural effusion, cardiomegaly and possible mild pulmonary edema at the bases raising concern for fluid overload. 3. Colonic diverticulosis without diverticulitis. 4. Advanced Aortic Atherosclerosis (ICD10-I70.0). Electronically Signed   By: Keith Rake M.D.   On: 08/17/2018 23:52   Dg Chest Port 1 View  Result Date: 08/17/2018 CLINICAL DATA:  Cough, difficulty swallowing EXAM: PORTABLE CHEST 1 VIEW COMPARISON:  Radiograph 12/08/2017 FINDINGS: Cardiomegaly. Atelectasis or scar at the right base. No focal consolidation or pleural effusion. Aortic atherosclerosis. No pneumothorax. Advanced degenerative change of the right shoulder. Clips in the right upper quadrant. IMPRESSION: No active disease. Cardiomegaly with  atelectasis or scar at the right base Electronically Signed   By: Donavan Foil M.D.   On: 08/17/2018 22:32    EKG: Independently reviewed.  Junctional rhythm at 66 bpm Assessment/Plan Small bowel obstruction: Acute.  Patient presents with nausea, vomiting, and epigastric abdominal pain.  Found to have acute small bowel obstruction on CT imaging.  Patient still reports being able to pass flatus. - Admit to a telemetry bed - Strict intake and output - continue NGT - Normal saline at 75 mL/h - Fentanyl PRN pain  Lactic acidosis: Acute.  Initial lactic acid 1.92.  Suspect likely related to acute dehydration and/or infection. - Trend lactic acid level  Urinary tract infection: Patient found to have large leukocytes with many bacteria and 21-50 WBCs. - Check urine culture - Rocephin IV   Chronic kidney disease stage IV: Patient presents with a creatinine of 1.81 which appears near her baseline of 1.7- 1.8. - Continue to monitor  GERD - Pepcid IV twice daily  Stage I/II pressure ulcer of the left heel - Heel pads - Prevalon boot  Dislocated left shoulder: Chronic.  Patient has a dislocated left shoulder since February.  No reported trauma or injury to onset symptoms.   Peripheral arterial disease, mesenteric ischemia s/p bypass grafting in 2012  DVT prophylaxis: Heparin Code Status: Full Family Communication: This plan of care with the patient and family present bedside Disposition Plan: Likely discharge home once medically stable Consults called: Surgery Admission status: inpatient  Norval Morton MD [facility] Pager [phone]   If 7PM-7AM, please contact night-coverage [email or web address] Password [email or web address]  08/18/2018, 1:12 AM

## 2018-08-18 NOTE — Plan of Care (Signed)
Patient stable during 7 a to 7 p shift, denies any abdominal pain or nausea.  NG draining yellow fluid, approximately 400 ccs this shift.  Patient had not voided since sometime during last night, MD notified, order for in and out cath, 300 ccs yellow urine returned.  Son and daughter in law at bedside.

## 2018-08-19 ENCOUNTER — Inpatient Hospital Stay (HOSPITAL_COMMUNITY): Payer: Medicare (Managed Care)

## 2018-08-19 LAB — BASIC METABOLIC PANEL
Anion gap: 16 — ABNORMAL HIGH (ref 5–15)
BUN: 52 mg/dL — AB (ref 8–23)
CALCIUM: 7.3 mg/dL — AB (ref 8.9–10.3)
CO2: 23 mmol/L (ref 22–32)
CREATININE: 1.8 mg/dL — AB (ref 0.44–1.00)
Chloride: 104 mmol/L (ref 98–111)
GFR calc Af Amer: 27 mL/min — ABNORMAL LOW (ref >60)
GFR calc non Af Amer: 23 mL/min — ABNORMAL LOW (ref >60)
GLUCOSE: 66 mg/dL — AB (ref 70–99)
Potassium: 3.3 mmol/L — ABNORMAL LOW (ref 3.5–5.1)
Sodium: 143 mmol/L (ref 135–145)

## 2018-08-19 LAB — CBC WITH DIFFERENTIAL/PLATELET
Basophils Absolute: 0 10*3/uL (ref 0.0–0.1)
Basophils Relative: 0 %
EOS PCT: 0 %
Eosinophils Absolute: 0 10*3/uL (ref 0.0–0.7)
HCT: 30.7 % — ABNORMAL LOW (ref 36.0–46.0)
Hemoglobin: 10.5 g/dL — ABNORMAL LOW (ref 12.0–15.0)
Lymphocytes Relative: 18 %
Lymphs Abs: 1.7 10*3/uL (ref 0.7–4.0)
MCH: 24.6 pg — ABNORMAL LOW (ref 26.0–34.0)
MCHC: 34.2 g/dL (ref 30.0–36.0)
MCV: 72.1 fL — AB (ref 78.0–100.0)
MONO ABS: 0.4 10*3/uL (ref 0.1–1.0)
MONOS PCT: 4 %
NEUTROS ABS: 7.4 10*3/uL (ref 1.7–7.7)
NEUTROS PCT: 78 %
PLATELETS: 466 10*3/uL — AB (ref 150–400)
RBC: 4.26 MIL/uL (ref 3.87–5.11)
RDW: 17.6 % — AB (ref 11.5–15.5)
WBC: 9.4 10*3/uL (ref 4.0–10.5)

## 2018-08-19 LAB — MAGNESIUM: Magnesium: 2.1 mg/dL (ref 1.7–2.4)

## 2018-08-19 MED ORDER — POTASSIUM CHLORIDE IN NACL 40-0.9 MEQ/L-% IV SOLN
INTRAVENOUS | Status: DC
  Administered 2018-08-19 – 2018-08-20 (×2): 75 mL/h via INTRAVENOUS
  Filled 2018-08-19 (×2): qty 1000

## 2018-08-19 MED ORDER — PHENOL 1.4 % MT LIQD
1.0000 | OROMUCOSAL | Status: DC | PRN
  Filled 2018-08-19: qty 177

## 2018-08-19 NOTE — Plan of Care (Signed)
  Problem: Elimination: Goal: Will not experience complications related to bowel motility Outcome: Progressing   

## 2018-08-19 NOTE — Progress Notes (Signed)
[facility] Office:  [phone] General Surgery Progress Note   LOS: 1 day  POD -     Chief Complaint: Abdominal pina  Assessment and Plan: 1.  SBO             WBC - 9,400 - 08/19/2018  Rocephin  KUB - still has SB dilatation, maybe some contrast in right colon  Appears a little better. Continue NGT and repeat KUB in AM.  Try to ambulate to chair 2-3 times per day.   2.  CKD   Creatinine - 1.8 - 08/19/2018 3.  Hypokalemia  K+ - 3.3 - 08/19/2018  On replacement per IV 4.  A. Fib 5.  HTN 6.  History of stroke - without deficit 7.  Right AKA (for ischemic disease?) 8.  Open cholecystectomy, thrombectomy of superior mesenteric artery, bovine pericardial patch, superior mesenteric artery, aorta to superior mesenteric artery bypass with 6-mm Dacron on 06/13/2011 by Drs. Fields/Ingram.   9.  Pressure ulcer on left heel and left shoulder 10.  DVT prophylaxis - SQ Heparin   Principal Problem:   SBO (small bowel obstruction) (HCC) Active Problems:   Hypertension   Acute lower UTI   Lactic acid acidosis   CKD (chronic kidney disease), stage IV (HCC)   Heel ulcer (HCC)   Pressure ulcer, heel  Subjective:  No abdominal pain but feels queazy.  She had 2 BM's.  Daughter in law, Breindel Collier, at bedside.  She has weight loss surgery by Dr. Zettie Pho.  Objective:   Vitals:   08/18/18 2139 08/19/18 0601  BP: (!) 100/55 99/73  Pulse: 78 71  Resp: 16 16  Temp: 98.1 F (36.7 C) 98.5 F (36.9 C)  SpO2: 96% 96%     Intake/Output from previous day:  09/14 0701 - 09/15 0700 In: 1837.4 [I.V.:1387.4; IV Piggyback:450] Out: 950 [Emesis/NG output:650]  Intake/Output this shift:  No intake/output data recorded.   Physical Exam:   General: Older AA F who is alert and oriented.    HEENT: Normal. Pupils equal. .   Lungs: Clear   Abdomen: Few BS.  Soft.  No localized tenderness.   Lab Results:    Recent Labs    08/18/18 0424 08/19/18 0425  WBC 9.1 9.4  HGB 10.5*  10.5*  HCT 30.4* 30.7*  PLT 436* 466*    BMET   Recent Labs    08/18/18 0424 08/19/18 0425  NA 132* 143  K 3.6 3.3*  CL 93* 104  CO2 27 23  GLUCOSE 99 66*  BUN 47* 52*  CREATININE 1.74* 1.80*  CALCIUM 7.6* 7.3*    PT/INR  No results for input(s): LABPROT, INR in the last 72 hours.  ABG  No results for input(s): PHART, HCO3 in the last 72 hours.  Invalid input(s): PCO2, PO2   Studies/Results:  Ct Abdomen Pelvis Wo Contrast  Result Date: 08/17/2018 CLINICAL DATA:  Nausea and vomiting.  Generalized weakness. EXAM: CT ABDOMEN AND PELVIS WITHOUT CONTRAST TECHNIQUE: Multidetector CT imaging of the abdomen and pelvis was performed following the standard protocol without IV contrast. COMPARISON:  Contrast-enhanced exam 05/30/2018 FINDINGS: Lower chest: Calcified right hilar nodes. Moderate cardiomegaly with coronary artery calcifications. Small right and trace left pleural effusion. Basilar septal thickening suspicious for pulmonary edema. Hepatobiliary: No focal hepatic lesion. Post cholecystectomy. Common bile duct not well visualized on noncontrast exam. Pancreas: Parenchymal atrophy. No ductal dilatation or inflammation. Spleen: Small in size. Adrenals/Urinary Tract: No adrenal nodule. Lobular bilateral renal contours without hydronephrosis.  Urinary bladder near completely empty. Stomach/Bowel: Gastric distention with fluid and ingested contents. Diffuse fluid-filled small bowel dilatation with transition point in the right upper quadrant image 38 series 2 and image 35 series 5. More distal small bowel nondistended. There is diffuse mesenteric stranding. No pneumatosis. Colon is nondistended with small volume of colonic stool. Diverticulosis of the descending and sigmoid colon. Vascular/Lymphatic: Dense aortic and branch atherosclerosis. No aneurysm. Limited assessment for adenopathy. Reproductive: Post hysterectomy.  No gross adnexal mass. Other: Moderate volume of ascites with free fluid  in the pelvis, right pericolic gutter and right upper quadrant. No free air. Musculoskeletal: Scoliosis and multilevel degenerative change in the spine. The bones are under mineralized. Diffuse body wall edema, increased from prior exam. IMPRESSION: 1. Small bowel obstruction transition point in the right upper quadrant, possibly due to adhesions. 2. Moderate volume abdominopelvic ascites. This may be reactive in the setting of bowel obstruction, however there is also body wall edema, right pleural effusion, cardiomegaly and possible mild pulmonary edema at the bases raising concern for fluid overload. 3. Colonic diverticulosis without diverticulitis. 4. Advanced Aortic Atherosclerosis (ICD10-I70.0). Electronically Signed   By: Keith Rake M.D.   On: 08/17/2018 23:52   Dg Chest Port 1 View  Result Date: 08/17/2018 CLINICAL DATA:  Cough, difficulty swallowing EXAM: PORTABLE CHEST 1 VIEW COMPARISON:  Radiograph 12/08/2017 FINDINGS: Cardiomegaly. Atelectasis or scar at the right base. No focal consolidation or pleural effusion. Aortic atherosclerosis. No pneumothorax. Advanced degenerative change of the right shoulder. Clips in the right upper quadrant. IMPRESSION: No active disease. Cardiomegaly with atelectasis or scar at the right base Electronically Signed   By: Donavan Foil M.D.   On: 08/17/2018 22:32   Dg Abd Portable 1v-small Bowel Obstruction Protocol-initial, 8 Hr Delay  Result Date: 08/18/2018 CLINICAL DATA:  8 hour delay film for small-bowel protocol EXAM: PORTABLE ABDOMEN - 1 VIEW COMPARISON:  08/18/2018 FINDINGS: Nasogastric catheter is again noted within the stomach. Contrast material is seen within the stomach. Persistent small bowel dilatation is noted. No significant contrast material has passed beyond the stomach. Degenerative changes of the lumbar spine are seen. Diffuse vascular calcifications are again noted. IMPRESSION: Contrast remains within the stomach. Little or no contrast has  passed into the small bowel. Electronically Signed   By: Inez Catalina M.D.   On: 08/18/2018 14:14   Dg Abd Portable 1v-small Bowel Protocol-position Verification  Result Date: 08/18/2018 CLINICAL DATA:  NG tube placement. EXAM: PORTABLE ABDOMEN - 1 VIEW COMPARISON:  CT yesterday. FINDINGS: Tip and side port of the enteric tube below the diaphragm in the stomach. Persistent small bowel dilatation consistent with obstruction is seen on CT. IMPRESSION: Tip and side port of the enteric tube below the diaphragm in the stomach. Electronically Signed   By: Keith Rake M.D.   On: 08/18/2018 01:42     Anti-infectives:   Anti-infectives (From admission, onward)   Start     Dose/Rate Route Frequency Ordered Stop   08/18/18 0245  cefTRIAXone (ROCEPHIN) 1 g in sodium chloride 0.9 % 100 mL IVPB     1 g 200 mL/hr over 30 Minutes Intravenous Daily at bedtime 08/18/18 0234        Alphonsa Overall, MD, FACS Pager: [facility] Office: [phone] 08/19/2018

## 2018-08-19 NOTE — Plan of Care (Signed)
VSS, patient up to chair for several hours with use of hoyer lift.  Remains NPO, NG with minimal yellow liquid output this shift.  Patient has had 3 loose stools this shift, continues to deny abdominal pain or nausea.  Daughter in law and son at bedside for most of shift.

## 2018-08-19 NOTE — Progress Notes (Signed)
Patient ID: [name], female   DOB: Nov 12, 1925, 84 y.o.   MRN: [id number]  PROGRESS NOTE    [name]  BMW:[id number] DOB: 11-25-1925 DOA: 08/17/2018 PCP: Janifer Adie, MD   Brief Narrative:  84 year old female with history of hypertension, hyperlipidemia, A. fib, DVT, peripheral arterial disease, mesenteric ischemia status post bypass, status post right AKA, anemia presented with abdominal pain with nausea and vomiting.  CT scan of abdomen and pelvis showed small bowel obstruction with signs of possible fluid overload.  General surgery was consulted and NG tube was placed.  Assessment & Plan:   Principal Problem:   SBO (small bowel obstruction) (HCC) Active Problems:   Hypertension   Acute lower UTI   Lactic acid acidosis   CKD (chronic kidney disease), stage IV (HCC)   Heel ulcer (HCC)   Pressure ulcer, heel   Small bowel obstruction -General surgery following.  NG tube in place.  Patient had 2 small bowel movements this morning.  NG tube management and diet advancement as per general surgery -Abdominal exam is benign with no increasing tenderness  UTI -Follow cultures.  Continue Rocephin  Chronic kidney disease stage IV -Creatinine stable.  Monitor  Thrombocytosis -Probably reactive.  Monitor  GERD -Continue Pepcid  Stage I/II by pressure ulcer of the left heel -Continue wound care  Chronic dislocated left shoulder -Patient has a dislocated left shoulder since February.  Outpatient follow-up with orthopedic  Peripheral arterial disease, mesenteric ischemia status post bypass grafting in 2012 and history of right AKA   DVT prophylaxis: Heparin Code Status: Partial Family Communication: Spoke to family member present at bedside Disposition Plan: Home in 2 to 3 days  Consultants: General surgery  Procedures: None  Antimicrobials: Rocephin 08/17/2018 onwards   Subjective: Patient seen and examined at bedside.  She is a poor historian but  states that she feels a little better.  No worsening abdominal pain or vomiting.  Still has NG tube in place.  Objective: Vitals:   08/18/18 0325 08/18/18 1347 08/18/18 2139 08/19/18 0601  BP: (!) 139/98 97/68 (!) 100/55 99/73  Pulse: 82 81 78 71  Resp: 16  16 16   Temp: 98.9 F (37.2 C) 98.6 F (37 C) 98.1 F (36.7 C) 98.5 F (36.9 C)  TempSrc: Oral Oral Oral Oral  SpO2: 98% 96% 96% 96%  Weight: 53.1 kg       Intake/Output Summary (Last 24 hours) at 08/19/2018 1106 Last data filed at 08/19/2018 1012 Gross per 24 hour  Intake 1566.38 ml  Output 700 ml  Net 866.38 ml   Filed Weights   08/18/18 0325  Weight: 53.1 kg    Examination:  General exam: Appears calm and comfortable.  No distress.  Poor historian.  NG tube in place respiratory system: Bilateral decreased breath sounds at bases Cardiovascular system: S1 & S2 heard, Rate controlled Gastrointestinal system: Abdomen is nondistended, soft and nontender.  Bowel sounds sluggish Extremities: No cyanosis, clubbing; right AKA present   Data Reviewed: I have personally reviewed following labs and imaging studies  CBC: Recent Labs  Lab 08/17/18 2258 08/17/18 2304 08/18/18 0424 08/19/18 0425  WBC 9.6  --  9.1 9.4  NEUTROABS 8.4*  --   --  7.4  HGB 12.8 13.6 10.5* 10.5*  HCT 36.8 40.0 30.4* 30.7*  MCV 73.0*  --  71.5* 72.1*  PLT 439*  --  436* 272*   Basic Metabolic Panel: Recent Labs  Lab 08/17/18 2258 08/17/18 2304 08/18/18 0424 08/19/18  0425  NA 133* 129* 132* 143  K 4.0 3.9 3.6 3.3*  CL 90* 92* 93* 104  CO2 27  --  27 23  GLUCOSE 115* 112* 99 66*  BUN 46* 45* 47* 52*  CREATININE 1.81* 2.00* 1.74* 1.80*  CALCIUM 8.4*  --  7.6* 7.3*  MG  --   --   --  2.1   GFR: CrCl cannot be calculated (Unknown ideal weight.). Liver Function Tests: Recent Labs  Lab 08/17/18 2258  AST 23  ALT 12  ALKPHOS 60  BILITOT 0.8  PROT 7.1  ALBUMIN 2.5*   Recent Labs  Lab 08/17/18 2258  LIPASE 23   No results for  input(s): AMMONIA in the last 168 hours. Coagulation Profile: No results for input(s): INR, PROTIME in the last 168 hours. Cardiac Enzymes: No results for input(s): CKTOTAL, CKMB, CKMBINDEX, TROPONINI in the last 168 hours. BNP (last 3 results) No results for input(s): PROBNP in the last 8760 hours. HbA1C: No results for input(s): HGBA1C in the last 72 hours. CBG: Recent Labs  Lab 08/18/18 0332 08/18/18 0756 08/18/18 1821  GLUCAP 100* 91 76   Lipid Profile: No results for input(s): CHOL, HDL, LDLCALC, TRIG, CHOLHDL, LDLDIRECT in the last 72 hours. Thyroid Function Tests: No results for input(s): TSH, T4TOTAL, FREET4, T3FREE, THYROIDAB in the last 72 hours. Anemia Panel: No results for input(s): VITAMINB12, FOLATE, FERRITIN, TIBC, IRON, RETICCTPCT in the last 72 hours. Sepsis Labs: Recent Labs  Lab 08/17/18 2357 08/18/18 0234 08/18/18 0424 08/18/18 0535  LATICACIDVEN 1.92* 1.13 1.1 1.0    Recent Results (from the past 240 hour(s))  Urine Culture     Status: Abnormal (Preliminary result)   Collection Time: 08/18/18  1:18 AM  Result Value Ref Range Status   Specimen Description   Final    URINE, CLEAN CATCH Performed at [facility], [place] [address], [place], [place] [address]    Special Requests   Final    NONE Performed at [facility], [place] [address] [place], [place] [address]    Culture >=100,000 COLONIES/mL GRAM NEGATIVE RODS (A)  Final   Report Status PENDING  Incomplete         Radiology Studies: Ct Abdomen Pelvis Wo Contrast  Result Date: 08/17/2018 CLINICAL DATA:  Nausea and vomiting.  Generalized weakness. EXAM: CT ABDOMEN AND PELVIS WITHOUT CONTRAST TECHNIQUE: Multidetector CT imaging of the abdomen and pelvis was performed following the standard protocol without IV contrast. COMPARISON:  Contrast-enhanced exam 05/30/2018 FINDINGS: Lower chest: Calcified right hilar nodes. Moderate cardiomegaly with coronary artery  calcifications. Small right and trace left pleural effusion. Basilar septal thickening suspicious for pulmonary edema. Hepatobiliary: No focal hepatic lesion. Post cholecystectomy. Common bile duct not well visualized on noncontrast exam. Pancreas: Parenchymal atrophy. No ductal dilatation or inflammation. Spleen: Small in size. Adrenals/Urinary Tract: No adrenal nodule. Lobular bilateral renal contours without hydronephrosis. Urinary bladder near completely empty. Stomach/Bowel: Gastric distention with fluid and ingested contents. Diffuse fluid-filled small bowel dilatation with transition point in the right upper quadrant image 38 series 2 and image 35 series 5. More distal small bowel nondistended. There is diffuse mesenteric stranding. No pneumatosis. Colon is nondistended with small volume of colonic stool. Diverticulosis of the descending and sigmoid colon. Vascular/Lymphatic: Dense aortic and branch atherosclerosis. No aneurysm. Limited assessment for adenopathy. Reproductive: Post hysterectomy.  No gross adnexal mass. Other: Moderate volume of ascites with free fluid in the pelvis, right pericolic gutter and right upper quadrant. No  free air. Musculoskeletal: Scoliosis and multilevel degenerative change in the spine. The bones are under mineralized. Diffuse body wall edema, increased from prior exam. IMPRESSION: 1. Small bowel obstruction transition point in the right upper quadrant, possibly due to adhesions. 2. Moderate volume abdominopelvic ascites. This may be reactive in the setting of bowel obstruction, however there is also body wall edema, right pleural effusion, cardiomegaly and possible mild pulmonary edema at the bases raising concern for fluid overload. 3. Colonic diverticulosis without diverticulitis. 4. Advanced Aortic Atherosclerosis (ICD10-I70.0). Electronically Signed   By: Keith Rake M.D.   On: 08/17/2018 23:52   Dg Chest Port 1 View  Result Date: 08/17/2018 CLINICAL DATA:  Cough,  difficulty swallowing EXAM: PORTABLE CHEST 1 VIEW COMPARISON:  Radiograph 12/08/2017 FINDINGS: Cardiomegaly. Atelectasis or scar at the right base. No focal consolidation or pleural effusion. Aortic atherosclerosis. No pneumothorax. Advanced degenerative change of the right shoulder. Clips in the right upper quadrant. IMPRESSION: No active disease. Cardiomegaly with atelectasis or scar at the right base Electronically Signed   By: Donavan Foil M.D.   On: 08/17/2018 22:32   Dg Abd 2 Views  Result Date: 08/19/2018 CLINICAL DATA:  Small bowel obstruction, NG tube draining yellow fluid EXAM: ABDOMEN - 2 VIEW COMPARISON:  08/18/2018 FINDINGS: Dilated loops of small bowel in the left mid/lower abdomen, compatible with small bowel obstruction. Enteric tube terminates in the proximal stomach. Degenerative changes of the lumbar spine. Vascular calcifications. Cholecystectomy clips. IMPRESSION: Dilated loops of small bowel in the left mid/lower abdomen, compatible with small bowel obstruction. Enteric tube terminates in the proximal stomach. Electronically Signed   By: Julian Hy M.D.   On: 08/19/2018 09:10   Dg Abd Portable 1v-small Bowel Obstruction Protocol-initial, 8 Hr Delay  Result Date: 08/18/2018 CLINICAL DATA:  8 hour delay film for small-bowel protocol EXAM: PORTABLE ABDOMEN - 1 VIEW COMPARISON:  08/18/2018 FINDINGS: Nasogastric catheter is again noted within the stomach. Contrast material is seen within the stomach. Persistent small bowel dilatation is noted. No significant contrast material has passed beyond the stomach. Degenerative changes of the lumbar spine are seen. Diffuse vascular calcifications are again noted. IMPRESSION: Contrast remains within the stomach. Little or no contrast has passed into the small bowel. Electronically Signed   By: Inez Catalina M.D.   On: 08/18/2018 14:14   Dg Abd Portable 1v-small Bowel Protocol-position Verification  Result Date: 08/18/2018 CLINICAL DATA:  NG  tube placement. EXAM: PORTABLE ABDOMEN - 1 VIEW COMPARISON:  CT yesterday. FINDINGS: Tip and side port of the enteric tube below the diaphragm in the stomach. Persistent small bowel dilatation consistent with obstruction is seen on CT. IMPRESSION: Tip and side port of the enteric tube below the diaphragm in the stomach. Electronically Signed   By: Keith Rake M.D.   On: 08/18/2018 01:42        Scheduled Meds: . heparin  5,000 Units Subcutaneous Q8H  . sodium chloride flush  3 mL Intravenous Q12H   Continuous Infusions: . 0.9 % NaCl with KCl 40 mEq / L 75 mL/hr (08/19/18 0820)  . cefTRIAXone (ROCEPHIN)  IV Stopped (08/18/18 2235)  . famotidine (PEPCID) IV 20 mg (08/19/18 0917)     LOS: 1 day        Aline August, MD [facility] Pager [phone]  If 7PM-7AM, please contact night-coverage [email or web address] Password [email or web address] 08/19/2018, 11:06 AM

## 2018-08-20 ENCOUNTER — Inpatient Hospital Stay (HOSPITAL_COMMUNITY): Payer: Medicare (Managed Care)

## 2018-08-20 LAB — CBC WITH DIFFERENTIAL/PLATELET
BASOS ABS: 0 10*3/uL (ref 0.0–0.1)
Basophils Relative: 0 %
Eosinophils Absolute: 0 10*3/uL (ref 0.0–0.7)
Eosinophils Relative: 0 %
HEMATOCRIT: 27.7 % — AB (ref 36.0–46.0)
Hemoglobin: 9.4 g/dL — ABNORMAL LOW (ref 12.0–15.0)
LYMPHS PCT: 19 %
Lymphs Abs: 1.9 10*3/uL (ref 0.7–4.0)
MCH: 24.8 pg — ABNORMAL LOW (ref 26.0–34.0)
MCHC: 33.9 g/dL (ref 30.0–36.0)
MCV: 73.1 fL — AB (ref 78.0–100.0)
MONO ABS: 0.3 10*3/uL (ref 0.1–1.0)
Monocytes Relative: 3 %
NEUTROS ABS: 8 10*3/uL — AB (ref 1.7–7.7)
Neutrophils Relative %: 78 %
PLATELETS: 426 10*3/uL — AB (ref 150–400)
RBC: 3.79 MIL/uL — ABNORMAL LOW (ref 3.87–5.11)
RDW: 17.8 % — AB (ref 11.5–15.5)
WBC: 10.2 10*3/uL (ref 4.0–10.5)

## 2018-08-20 LAB — BASIC METABOLIC PANEL
Anion gap: 17 — ABNORMAL HIGH (ref 5–15)
BUN: 46 mg/dL — AB (ref 8–23)
CALCIUM: 6.9 mg/dL — AB (ref 8.9–10.3)
CO2: 18 mmol/L — ABNORMAL LOW (ref 22–32)
CREATININE: 1.71 mg/dL — AB (ref 0.44–1.00)
Chloride: 107 mmol/L (ref 98–111)
GFR calc Af Amer: 29 mL/min — ABNORMAL LOW (ref >60)
GFR, EST NON AFRICAN AMERICAN: 25 mL/min — AB (ref >60)
GLUCOSE: 51 mg/dL — AB (ref 70–99)
Potassium: 3.8 mmol/L (ref 3.5–5.1)
SODIUM: 142 mmol/L (ref 135–145)

## 2018-08-20 LAB — URINE CULTURE

## 2018-08-20 LAB — MAGNESIUM: MAGNESIUM: 1.8 mg/dL (ref 1.7–2.4)

## 2018-08-20 MED ORDER — KCL IN DEXTROSE-NACL 20-5-0.9 MEQ/L-%-% IV SOLN
INTRAVENOUS | Status: DC
  Administered 2018-08-20 – 2018-08-21 (×2): via INTRAVENOUS
  Filled 2018-08-20 (×2): qty 1000

## 2018-08-20 MED ORDER — FAMOTIDINE IN NACL 20-0.9 MG/50ML-% IV SOLN
20.0000 mg | INTRAVENOUS | Status: DC
  Administered 2018-08-20 – 2018-08-21 (×2): 20 mg via INTRAVENOUS
  Filled 2018-08-20 (×3): qty 50

## 2018-08-20 MED ORDER — CEFAZOLIN SODIUM-DEXTROSE 1-4 GM/50ML-% IV SOLN: 1.0000 g | Freq: Three times a day (TID) | INTRAVENOUS | Status: DC

## 2018-08-20 MED ORDER — DEXTROSE 5 % IV SOLN
500.0000 mg | Freq: Two times a day (BID) | INTRAVENOUS | Status: DC
  Administered 2018-08-20 – 2018-08-22 (×4): 500 mg via INTRAVENOUS
  Filled 2018-08-20 (×5): qty 5

## 2018-08-20 NOTE — Progress Notes (Signed)
Patient ID: [name], female   DOB: 09/11/1925, 84 y.o.   MRN: [id number]       Subjective: Patient is hungry.  Having multiple BMs per daughter.  No NGT output.  Objective: Vital signs in last 24 hours: Temp:  [98.7 F (37.1 C)-99.2 F (37.3 C)] 98.9 F (37.2 C) (09/16 0437) Pulse Rate:  [62-98] 62 (09/16 0437) Resp:  [16] 16 (09/16 0437) BP: (100-115)/(45-67) 115/52 (09/16 0437) SpO2:  [97 %-98 %] 97 % (09/16 0437) Last BM Date: 08/19/18  Intake/Output from previous day: 09/15 0701 - 09/16 0700 In: 1074.8 [I.V.:974.8; IV Piggyback:100] Out: 0  Intake/Output this shift: No intake/output data recorded.  PE: Heart: regular Lungs: CTAB Abd: soft, NT, Nd, +BS, NGT output with about 150cc of clear output from ice chips  Lab Results:  Recent Labs    08/19/18 0425 08/20/18 0408  WBC 9.4 10.2  HGB 10.5* 9.4*  HCT 30.7* 27.7*  PLT 466* 426*   BMET Recent Labs    08/19/18 0425 08/20/18 0408  NA 143 142  K 3.3* 3.8  CL 104 107  CO2 23 18*  GLUCOSE 66* 51*  BUN 52* 46*  CREATININE 1.80* 1.71*  CALCIUM 7.3* 6.9*   PT/INR No results for input(s): LABPROT, INR in the last 72 hours. CMP     Component Value Date/Time   NA 142 08/20/2018 0408   K 3.8 08/20/2018 0408   CL 107 08/20/2018 0408   CO2 18 (L) 08/20/2018 0408   GLUCOSE 51 (L) 08/20/2018 0408   BUN 46 (H) 08/20/2018 0408   CREATININE 1.71 (H) 08/20/2018 0408   CALCIUM 6.9 (L) 08/20/2018 0408   PROT 7.1 08/17/2018 2258   ALBUMIN 2.5 (L) 08/17/2018 2258   AST 23 08/17/2018 2258   ALT 12 08/17/2018 2258   ALKPHOS 60 08/17/2018 2258   BILITOT 0.8 08/17/2018 2258   GFRNONAA 25 (L) 08/20/2018 0408   GFRAA 29 (L) 08/20/2018 0408   Lipase     Component Value Date/Time   LIPASE 23 08/17/2018 2258       Studies/Results: Dg Abd 2 Views  Result Date: 08/20/2018 CLINICAL DATA:  Follow up small bowel obstruction EXAM: ABDOMEN - 2 VIEW COMPARISON:  08/19/2018 FINDINGS: Nasogastric catheter is  noted within the stomach. Contrast material is noted throughout the colon from previous CT examination. Small bowel dilatation is again identified. No free air is seen. Diffuse vascular calcifications are identified. No acute bony abnormality is seen. IMPRESSION: Persistent small bowel obstruction. Electronically Signed   By: Inez Catalina M.D.   On: 08/20/2018 10:23   Dg Abd 2 Views  Result Date: 08/19/2018 CLINICAL DATA:  Small bowel obstruction, NG tube draining yellow fluid EXAM: ABDOMEN - 2 VIEW COMPARISON:  08/18/2018 FINDINGS: Dilated loops of small bowel in the left mid/lower abdomen, compatible with small bowel obstruction. Enteric tube terminates in the proximal stomach. Degenerative changes of the lumbar spine. Vascular calcifications. Cholecystectomy clips. IMPRESSION: Dilated loops of small bowel in the left mid/lower abdomen, compatible with small bowel obstruction. Enteric tube terminates in the proximal stomach. Electronically Signed   By: Julian Hy M.D.   On: 08/19/2018 09:10   Dg Abd Portable 1v-small Bowel Obstruction Protocol-initial, 8 Hr Delay  Result Date: 08/18/2018 CLINICAL DATA:  8 hour delay film for small-bowel protocol EXAM: PORTABLE ABDOMEN - 1 VIEW COMPARISON:  08/18/2018 FINDINGS: Nasogastric catheter is again noted within the stomach. Contrast material is seen within the stomach. Persistent small bowel dilatation is noted.  No significant contrast material has passed beyond the stomach. Degenerative changes of the lumbar spine are seen. Diffuse vascular calcifications are again noted. IMPRESSION: Contrast remains within the stomach. Little or no contrast has passed into the small bowel. Electronically Signed   By: Inez Catalina M.D.   On: 08/18/2018 14:14    Anti-infectives: Anti-infectives (From admission, onward)   Start     Dose/Rate Route Frequency Ordered Stop   08/20/18 2200  ceFAZolin (ANCEF) IVPB 1 g/50 mL premix  Status:  Discontinued     1 g 100 mL/hr  over 30 Minutes Intravenous Every 8 hours 08/20/18 1051 08/20/18 1058   08/20/18 2200  ceFAZolin (ANCEF) 500 mg in dextrose 5 % 100 mL IVPB     500 mg 210 mL/hr over 30 Minutes Intravenous Every 12 hours 08/20/18 1058     08/18/18 0245  cefTRIAXone (ROCEPHIN) 1 g in sodium chloride 0.9 % 100 mL IVPB  Status:  Discontinued     1 g 200 mL/hr over 30 Minutes Intravenous Daily at bedtime 08/18/18 0234 08/20/18 1051       Assessment/Plan SBO -seems to be resolving.  AXR still shows some dilated loops of SB, but she is having multiple BMs, no NGT output, and has contrast in her colon on her plain films. -will DC NGT and give clear liquids.  SB dilatation may be secondary to bowel dysmotility, lagging of films, or pSBO.  At this time though she has evidence of improving bowel function and will see how she does.  A. Fib  HTN  History of stroke - without deficit  Right AKA  Mesenteric ischemia s/p bypass - In 2012  Pressure ulcer on left heel and left shoulder  FEN - DC NG/clear liquids VTE - heaprin ID - none currently   LOS: 2 days    Henreitta Cea , [facility] 08/20/2018, 11:01 AM Pager: [phone]

## 2018-08-20 NOTE — Progress Notes (Signed)
PT Cancellation Note / Screen  Patient Details Name: [name] MRN: [id number] DOB: Apr 20, 1925   Cancelled Treatment:    Reason Eval/Treat Not Completed: PT screened, no needs identified, will sign off Per MD note, "At baseline patient bedbound and family report utilizing a Hoyer lift at home."  Pt not appropriate for skilled acute PT.  PT to sign off.   Jedd Schulenburg,KATHrine E 08/20/2018, 8:52 AM Carmelia Bake, PT, DPT Acute Rehabilitation Services Office: [phone] Pager: [phone]

## 2018-08-20 NOTE — Progress Notes (Signed)
Patient ID: [name], female   DOB: July 27, 1925, 84 y.o.   MRN: [id number]  PROGRESS NOTE    [name]  GNO:[id number] DOB: Aug 01, 1925 DOA: 08/17/2018 PCP: Janifer Adie, MD   Brief Narrative:  84 year old female with history of hypertension, hyperlipidemia, A. fib, DVT, peripheral arterial disease, mesenteric ischemia status post bypass, status post right AKA, anemia presented with abdominal pain with nausea and vomiting.  CT scan of abdomen and pelvis showed small bowel obstruction with signs of possible fluid overload.  General surgery was consulted and NG tube was placed.  Assessment & Plan:   Principal Problem:   SBO (small bowel obstruction) (HCC) Active Problems:   Hypertension   Acute lower UTI   Lactic acid acidosis   CKD (chronic kidney disease), stage IV (HCC)   Heel ulcer (HCC)   Pressure ulcer, heel   Small bowel obstruction -General surgery following.  NG tube in place.  Patient had bowel movements yesterday.  NG tube management and diet advancement as per general surgery -Abdominal exam is benign with no increasing tenderness  UTI -Urine cultures growing Klebsiella pneumonia.  Sensitivities pending.  Continue Rocephin  Chronic kidney disease stage IV -Creatinine stable.  Monitor  Thrombocytosis -Probably reactive.  Monitor  GERD -Continue Pepcid  Stage I/II by pressure ulcer of the left heel -Continue wound care  Chronic dislocated left shoulder -Patient has a dislocated left shoulder since February.  Outpatient follow-up with orthopedic  Peripheral arterial disease, mesenteric ischemia status post bypass grafting in 2012 and history of right AKA   DVT prophylaxis: Heparin Code Status: Partial Family Communication: Spoke to family member present at bedside Disposition Plan: Home in 1 to 2 days if clinically improves and once cleared by general surgery  Consultants: General surgery  Procedures: None  Antimicrobials: Rocephin  08/17/2018 onwards   Subjective: Patient seen and examined at bedside.  Patient is a poor historian but states that she is feeling better.  Still has NG tube in place.  No overnight worsening fever, nausea, vomiting or abdominal pain.  She had 3 bowel movements yesterday as per the nursing staff.  Objective: Vitals:   08/19/18 0601 08/19/18 1541 08/19/18 2107 08/20/18 0437  BP: 99/73 (!) 100/45 102/67 (!) 115/52  Pulse: 71 81 98 62  Resp: 16 16 16 16   Temp: 98.5 F (36.9 C) 98.7 F (37.1 C) 99.2 F (37.3 C) 98.9 F (37.2 C)  TempSrc: Oral Oral Oral Oral  SpO2: 96% 98% 97% 97%  Weight:        Intake/Output Summary (Last 24 hours) at 08/20/2018 0931 Last data filed at 08/19/2018 2200 Gross per 24 hour  Intake 1074.84 ml  Output 0 ml  Net 1074.84 ml   Filed Weights   08/18/18 0325  Weight: 53.1 kg    Examination:  General exam: No distress.  NG tube in place  respiratory system: Bilateral decreased breath sounds at bases, scattered crackles Cardiovascular system: Rate controlled, S1-S2 heard Gastrointestinal system: Abdomen is nondistended, soft and nontender.  Bowel sounds sluggish Extremities: No cyanosis; right AKA present   Data Reviewed: I have personally reviewed following labs and imaging studies  CBC: Recent Labs  Lab 08/17/18 2258 08/17/18 2304 08/18/18 0424 08/19/18 0425 08/20/18 0408  WBC 9.6  --  9.1 9.4 10.2  NEUTROABS 8.4*  --   --  7.4 8.0*  HGB 12.8 13.6 10.5* 10.5* 9.4*  HCT 36.8 40.0 30.4* 30.7* 27.7*  MCV 73.0*  --  71.5* 72.1* 73.1*  PLT 439*  --  436* 466* 169*   Basic Metabolic Panel: Recent Labs  Lab 08/17/18 2258 08/17/18 2304 08/18/18 0424 08/19/18 0425 08/20/18 0408  NA 133* 129* 132* 143 142  K 4.0 3.9 3.6 3.3* 3.8  CL 90* 92* 93* 104 107  CO2 27  --  27 23 18*  GLUCOSE 115* 112* 99 66* 51*  BUN 46* 45* 47* 52* 46*  CREATININE 1.81* 2.00* 1.74* 1.80* 1.71*  CALCIUM 8.4*  --  7.6* 7.3* 6.9*  MG  --   --   --  2.1 1.8    GFR: CrCl cannot be calculated (Unknown ideal weight.). Liver Function Tests: Recent Labs  Lab 08/17/18 2258  AST 23  ALT 12  ALKPHOS 60  BILITOT 0.8  PROT 7.1  ALBUMIN 2.5*   Recent Labs  Lab 08/17/18 2258  LIPASE 23   No results for input(s): AMMONIA in the last 168 hours. Coagulation Profile: No results for input(s): INR, PROTIME in the last 168 hours. Cardiac Enzymes: No results for input(s): CKTOTAL, CKMB, CKMBINDEX, TROPONINI in the last 168 hours. BNP (last 3 results) No results for input(s): PROBNP in the last 8760 hours. HbA1C: No results for input(s): HGBA1C in the last 72 hours. CBG: Recent Labs  Lab 08/18/18 0332 08/18/18 0756 08/18/18 1821  GLUCAP 100* 91 76   Lipid Profile: No results for input(s): CHOL, HDL, LDLCALC, TRIG, CHOLHDL, LDLDIRECT in the last 72 hours. Thyroid Function Tests: No results for input(s): TSH, T4TOTAL, FREET4, T3FREE, THYROIDAB in the last 72 hours. Anemia Panel: No results for input(s): VITAMINB12, FOLATE, FERRITIN, TIBC, IRON, RETICCTPCT in the last 72 hours. Sepsis Labs: Recent Labs  Lab 08/17/18 2357 08/18/18 0234 08/18/18 0424 08/18/18 0535  LATICACIDVEN 1.92* 1.13 1.1 1.0    Recent Results (from the past 240 hour(s))  Urine Culture     Status: Abnormal (Preliminary result)   Collection Time: 08/18/18  1:18 AM  Result Value Ref Range Status   Specimen Description   Final    URINE, CLEAN CATCH Performed at [facility], [place] [address] [place], [place] [address]    Special Requests   Final    NONE Performed at [facility], [place] [address], [place],  [address]    Culture >=100,000 COLONIES/mL KLEBSIELLA PNEUMONIAE (A)  Final   Report Status PENDING  Incomplete         Radiology Studies: Dg Abd 2 Views  Result Date: 08/19/2018 CLINICAL DATA:  Small bowel obstruction, NG tube draining yellow fluid EXAM: ABDOMEN - 2 VIEW COMPARISON:  08/18/2018 FINDINGS:  Dilated loops of small bowel in the left mid/lower abdomen, compatible with small bowel obstruction. Enteric tube terminates in the proximal stomach. Degenerative changes of the lumbar spine. Vascular calcifications. Cholecystectomy clips. IMPRESSION: Dilated loops of small bowel in the left mid/lower abdomen, compatible with small bowel obstruction. Enteric tube terminates in the proximal stomach. Electronically Signed   By: Julian Hy M.D.   On: 08/19/2018 09:10   Dg Abd Portable 1v-small Bowel Obstruction Protocol-initial, 8 Hr Delay  Result Date: 08/18/2018 CLINICAL DATA:  8 hour delay film for small-bowel protocol EXAM: PORTABLE ABDOMEN - 1 VIEW COMPARISON:  08/18/2018 FINDINGS: Nasogastric catheter is again noted within the stomach. Contrast material is seen within the stomach. Persistent small bowel dilatation is noted. No significant contrast material has passed beyond the stomach. Degenerative changes of the lumbar spine are seen. Diffuse vascular calcifications are again noted. IMPRESSION: Contrast remains within the  stomach. Little or no contrast has passed into the small bowel. Electronically Signed   By: Inez Catalina M.D.   On: 08/18/2018 14:14        Scheduled Meds: . heparin  5,000 Units Subcutaneous Q8H  . sodium chloride flush  3 mL Intravenous Q12H   Continuous Infusions: . cefTRIAXone (ROCEPHIN)  IV 1 g (08/19/18 2155)  . dextrose 5 % and 0.9 % NaCl with KCl 20 mEq/L 50 mL/hr at 08/20/18 0852  . famotidine (PEPCID) IV 20 mg (08/19/18 2110)     LOS: 2 days        Aline August, MD [facility] Pager [phone]  If 7PM-7AM, please contact night-coverage [email or web address] Password [facility] 08/20/2018, 9:31 AM

## 2018-08-21 LAB — BASIC METABOLIC PANEL
ANION GAP: 9 (ref 5–15)
BUN: 35 mg/dL — ABNORMAL HIGH (ref 8–23)
CALCIUM: 7.3 mg/dL — AB (ref 8.9–10.3)
CO2: 25 mmol/L (ref 22–32)
CREATININE: 1.42 mg/dL — AB (ref 0.44–1.00)
Chloride: 113 mmol/L — ABNORMAL HIGH (ref 98–111)
GFR, EST AFRICAN AMERICAN: 36 mL/min — AB (ref >60)
GFR, EST NON AFRICAN AMERICAN: 31 mL/min — AB (ref >60)
Glucose, Bld: 116 mg/dL — ABNORMAL HIGH (ref 70–99)
Potassium: 3.6 mmol/L (ref 3.5–5.1)
SODIUM: 147 mmol/L — AB (ref 135–145)

## 2018-08-21 LAB — GLUCOSE, CAPILLARY: GLUCOSE-CAPILLARY: 72 mg/dL (ref 70–99)

## 2018-08-21 LAB — MAGNESIUM: MAGNESIUM: 1.9 mg/dL (ref 1.7–2.4)

## 2018-08-21 MED ORDER — DEXTROSE-NACL 5-0.45 % IV SOLN
INTRAVENOUS | Status: DC
  Administered 2018-08-21: 11:00:00 via INTRAVENOUS

## 2018-08-21 MED ORDER — ADULT MULTIVITAMIN W/MINERALS CH
1.0000 | ORAL_TABLET | Freq: Every day | ORAL | Status: DC
  Administered 2018-08-21 – 2018-08-22 (×2): 1 via ORAL
  Filled 2018-08-21 (×2): qty 1

## 2018-08-21 MED ORDER — ENSURE ENLIVE PO LIQD
237.0000 mL | Freq: Two times a day (BID) | ORAL | Status: DC
  Administered 2018-08-21 – 2018-08-22 (×3): 237 mL via ORAL

## 2018-08-21 NOTE — Progress Notes (Signed)
Initial Nutrition Assessment  DOCUMENTATION CODES:   Non-severe (moderate) malnutrition in context of acute illness/injury  INTERVENTION:    Ensure Enlive po BID, each supplement provides 350 kcal and 20 grams of protein  Magic cup BID with meals, each supplement provides 290 kcal and 9 grams of protein  Provide MVI daily  NUTRITION DIAGNOSIS:   Moderate Malnutrition related to acute illness(SBO with recurrent n/v) as evidenced by energy intake < 75% for > or equal to 1 month, percent weight loss, mild fat depletion, severe muscle depletion.  GOAL:   Patient will meet greater than or equal to 90% of their needs  MONITOR:   PO intake, Supplement acceptance, Diet advancement, Labs, Weight trends, I & O's  REASON FOR ASSESSMENT:   Other (Comment)(PI report)    ASSESSMENT:   Patient with PMH significant for HTN, HLD, A. fib, DVT, CAD, PAD, CKD IV, mesenteric ischemia s/p bypass, s/p right AKA (07/04/17), and anemia. Presents this admission with complaints of abdominal pain and nausea/vomiting. Admitted for SBO.    9/14- NGT placed 9/16- NGT removed- clear liquid diet 9/17- full liquid diet  Son reports pt's appetite declined slowly over the last month due to on/off bouts of vomiting. States during this time period she would have days she could eat 1-2 meals but others she wouldn't eat anything. Over the last two weeks they have tried a liquid diet with food options like soup, ice cream, and broth. Over the past couple of days she couldn't keep these liquids down. RD saw pt drinking cranberry juice at bedside. She endorses having nausea yesterday but this has resolved today. Discussed the importance of protein intake for preservation of lean body mass and to aid with would healing. Pt likes Ensure and drinks them inconsistently at home. RD to order.    Son unsure of pt's UBW but endorses recent wt loss. Records indicate pt weighed 138 lb 02/07/18 and 117 lb this admission (15.2% wt  loss in 6 months, significant for time frame). Nutrition-Focused physical exam completed. Pt is bed bound. Did not assess legs due to bed tray over them.   Medications reviewed and include: D5 @ 50 ml/hr Labs reviewed: Na 147 (H)   NUTRITION - FOCUSED PHYSICAL EXAM:    Most Recent Value  Orbital Region  No depletion  Upper Arm Region  Moderate depletion  Thoracic and Lumbar Region  Unable to assess  Buccal Region  Mild depletion  Temple Region  Mild depletion  Clavicle Bone Region  Severe depletion  Clavicle and Acromion Bone Region  Severe depletion  Scapular Bone Region  Unable to assess  Dorsal Hand  Mild depletion  Patellar Region  Unable to assess  Anterior Thigh Region  Unable to assess  Posterior Calf Region  Unable to assess  Edema (RD Assessment)  Unable to assess     Diet Order:   Diet Order            Diet full liquid Room service appropriate? Yes; Fluid consistency: Thin  Diet effective now              EDUCATION NEEDS:   Education needs have been addressed  Skin:  Skin Assessment: Skin Integrity Issues: Skin Integrity Issues:: Stage II Stage II: left heel, coccyx  Last BM:  08/20/18  Height:   Ht Readings from Last 1 Encounters:  08/21/18 5' (1.524 m)    Weight:   Wt Readings from Last 1 Encounters:  08/18/18 53.1 kg  Ideal Body Weight:  42.5 kg(adjusted L BKA)  BMI:  Body mass index is 22.87 kg/m.  Estimated Nutritional Needs:   Kcal:  1500-1700 kcal  Protein:  75-90 grams  Fluid:  >/= 1.5 L/day   Mariana Single RD, LDN Clinical Nutrition Pager # - [phone]

## 2018-08-21 NOTE — Consult Note (Addendum)
[place] Nurse wound consult note Reason for Consult: Consult requested for heel and sacrum/buttocks Wound type: Left heel with stage 2 pressure injury; 4X4X.1cm, pink and moist, no odor, drainage, or fluctuance Sacrum and buttocks with 2 areas of stage 2 pressure injuries; .3X.3X.1cm and .2X.2X.1cm, pink and moist, surrounded by patchy areas of partail thickness skin loss; appearance is consistent with moisture associated skin damage.  Pt is frequently incontinent of urine and currently has a Purewick in place to attempt to contain urine. Pressure Injury POA: Yes Dressing procedure/placement/frequency: Float heel to reduce pressure.  Foam dressing to heel and sacrum/buttocks to protect and promote healing. No family present to discuss plan of care. Please re-consult if further assistance is needed.  Thank-you,  Julien Girt MSN, [place], [place], [place], [place]

## 2018-08-21 NOTE — Progress Notes (Signed)
Spoke with pt's CM at [phone] concerning Pt. [facility] and home care at home. Will need to know of any medication changes, please.

## 2018-08-21 NOTE — Progress Notes (Signed)
PT Cancellation Note  Patient Details Name: [name] MRN: [id number] DOB: Feb 22, 1925   Cancelled Treatment:    Reason Eval/Treat Not Completed: PT screened, no needs identified, will sign off,as noted in previous PT note, patient is total care.    Claretha Cooper 08/21/2018, 12:33 PM Tresa Endo PT Acute Rehabilitation Services Pager [phone] Office [phone]

## 2018-08-21 NOTE — Progress Notes (Signed)
Patient ID: [name], female   DOB: April 02, 1925, 84 y.o.   MRN: [id number]       Subjective: Pt feels well today.  Had another BM overnight.  No nausea.  Tolerating the clear liquids.  No bloating or abdominal pain.  Objective: Vital signs in last 24 hours: Temp:  [97.8 F (36.6 C)-99.3 F (37.4 C)] 97.8 F (36.6 C) (09/17 0556) Pulse Rate:  [68-84] 84 (09/17 0556) Resp:  [17-18] 17 (09/17 0556) BP: (111-138)/(59-95) 138/95 (09/17 0556) SpO2:  [95 %-98 %] 97 % (09/17 0556) Last BM Date: 08/20/18  Intake/Output from previous day: 09/16 0701 - 09/17 0700 In: 2429.1 [P.O.:180; I.V.:1910.2; IV Piggyback:338.9] Out: -  Intake/Output this shift: No intake/output data recorded.  PE: Heart: irregular Lungs: CTAB Abd: soft, NT, ND, +BS  Lab Results:  Recent Labs    08/19/18 0425 08/20/18 0408  WBC 9.4 10.2  HGB 10.5* 9.4*  HCT 30.7* 27.7*  PLT 466* 426*   BMET Recent Labs    08/20/18 0408 08/21/18 0356  NA 142 147*  K 3.8 3.6  CL 107 113*  CO2 18* 25  GLUCOSE 51* 116*  BUN 46* 35*  CREATININE 1.71* 1.42*  CALCIUM 6.9* 7.3*   PT/INR No results for input(s): LABPROT, INR in the last 72 hours. CMP     Component Value Date/Time   NA 147 (H) 08/21/2018 0356   K 3.6 08/21/2018 0356   CL 113 (H) 08/21/2018 0356   CO2 25 08/21/2018 0356   GLUCOSE 116 (H) 08/21/2018 0356   BUN 35 (H) 08/21/2018 0356   CREATININE 1.42 (H) 08/21/2018 0356   CALCIUM 7.3 (L) 08/21/2018 0356   PROT 7.1 08/17/2018 2258   ALBUMIN 2.5 (L) 08/17/2018 2258   AST 23 08/17/2018 2258   ALT 12 08/17/2018 2258   ALKPHOS 60 08/17/2018 2258   BILITOT 0.8 08/17/2018 2258   GFRNONAA 31 (L) 08/21/2018 0356   GFRAA 36 (L) 08/21/2018 0356   Lipase     Component Value Date/Time   LIPASE 23 08/17/2018 2258       Studies/Results: Dg Abd 2 Views  Result Date: 08/20/2018 CLINICAL DATA:  Follow up small bowel obstruction EXAM: ABDOMEN - 2 VIEW COMPARISON:  08/19/2018 FINDINGS:  Nasogastric catheter is noted within the stomach. Contrast material is noted throughout the colon from previous CT examination. Small bowel dilatation is again identified. No free air is seen. Diffuse vascular calcifications are identified. No acute bony abnormality is seen. IMPRESSION: Persistent small bowel obstruction. Electronically Signed   By: Inez Catalina M.D.   On: 08/20/2018 10:23    Anti-infectives: Anti-infectives (From admission, onward)   Start     Dose/Rate Route Frequency Ordered Stop   08/20/18 2200  ceFAZolin (ANCEF) IVPB 1 g/50 mL premix  Status:  Discontinued     1 g 100 mL/hr over 30 Minutes Intravenous Every 8 hours 08/20/18 1051 08/20/18 1058   08/20/18 2200  ceFAZolin (ANCEF) 500 mg in dextrose 5 % 100 mL IVPB     500 mg 210 mL/hr over 30 Minutes Intravenous Every 12 hours 08/20/18 1058     08/18/18 0245  cefTRIAXone (ROCEPHIN) 1 g in sodium chloride 0.9 % 100 mL IVPB  Status:  Discontinued     1 g 200 mL/hr over 30 Minutes Intravenous Daily at bedtime 08/18/18 0234 08/20/18 1051       Assessment/Plan SBO -tolerated clear liquids, advance to full liquids today. -d/w RN about getting her OOB to a chair  BID for mobilization. A. Fib HTN History of stroke - without deficit Right AKA Mesenteric ischemia s/p bypass- In 2012 Pressure ulcer on left heel and left shoulder  FEN - full liquids VTE - heaprin ID - none currently   LOS: 3 days    Henreitta Cea , [facility] 08/21/2018, 9:07 AM Pager: [phone]

## 2018-08-21 NOTE — Progress Notes (Signed)
Received report from RN. I agree with previous assessment. Patient with no new complaints.  Will continue to monitor closely

## 2018-08-21 NOTE — Progress Notes (Signed)
Patient ID: [name], female   DOB: 1925/06/30, 84 y.o.   MRN: [id number]  PROGRESS NOTE    KEEYA DYCKMAN  ZYS:[id number] DOB: 03/21/1925 DOA: 08/17/2018 PCP: Janifer Adie, MD   Brief Narrative:  84 year old female with history of hypertension, hyperlipidemia, A. fib, DVT, peripheral arterial disease, mesenteric ischemia status post bypass, status post right AKA, anemia presented with abdominal pain with nausea and vomiting.  CT scan of abdomen and pelvis showed small bowel obstruction with signs of possible fluid overload.  General surgery was consulted and NG tube was placed.  Assessment & Plan:   Principal Problem:   SBO (small bowel obstruction) (HCC) Active Problems:   Hypertension   Acute lower UTI   Lactic acid acidosis   CKD (chronic kidney disease), stage IV (HCC)   Heel ulcer (HCC)   Pressure ulcer, heel   Small bowel obstruction -General surgery following.  NG tube has been removed.  Tolerating clear liquid diet.  Diet being advanced by general surgery.  UTI -Urine cultures growing Klebsiella pneumonia.  Rocephin has been switched to Ancef.  Finish 5-day course of therapy  Chronic kidney disease stage IV -Creatinine stable.  Monitor  Thrombocytosis -Probably reactive.  Monitor  GERD -Continue Pepcid  Stage I/II by pressure ulcer of the left heel -Continue wound care  Chronic dislocated left shoulder -Patient has a dislocated left shoulder since February.  Outpatient follow-up with orthopedics  Peripheral arterial disease, mesenteric ischemia status post bypass grafting in 2012 and history of right AKA  Generalized deconditioning -PT eval.  Care management consult   DVT prophylaxis: Heparin Code Status: Partial Family Communication: None at bedside Disposition Plan: Home in 1 to 2 days if clinically improves and once cleared by general surgery  Consultants: General surgery  Procedures: None  Antimicrobials: Rocephin  08/17/2018-08/20/2018 Ancef from 08/20/2018 onwards   Subjective: Patient seen and examined at bedside.  Patient is a poor historian but states that she is feeling better.  She denies any worsening abdominal pain.  She is having bowel movements.  She has tolerated clear liquid diet. Objective: Vitals:   08/20/18 1300 08/20/18 2209 08/21/18 0556 08/21/18 0615  BP: (!) 112/59 111/72 (!) 138/95   Pulse: 68 74 84   Resp: 18 18 17    Temp: 98.4 F (36.9 C) 99.3 F (37.4 C) 97.8 F (36.6 C)   TempSrc: Oral Oral Oral   SpO2: 98% 95% 97%   Weight:      Height:    5' (1.524 m)    Intake/Output Summary (Last 24 hours) at 08/21/2018 1056 Last data filed at 08/21/2018 0940 Gross per 24 hour  Intake 1727.61 ml  Output 500 ml  Net 1227.61 ml   Filed Weights   08/18/18 0325  Weight: 53.1 kg    Examination:  General exam: No acute distress.  NG tube has been removed  respiratory system: Bilateral decreased breath sounds at bases, no wheezing Cardiovascular system: S1-S2 heard, rate controlled Gastrointestinal system: Abdomen is nondistended, soft and nontender.  Bowel sounds sluggish Extremities: No cyanosis; right AKA present   Data Reviewed: I have personally reviewed following labs and imaging studies  CBC: Recent Labs  Lab 08/17/18 2258 08/17/18 2304 08/18/18 0424 08/19/18 0425 08/20/18 0408  WBC 9.6  --  9.1 9.4 10.2  NEUTROABS 8.4*  --   --  7.4 8.0*  HGB 12.8 13.6 10.5* 10.5* 9.4*  HCT 36.8 40.0 30.4* 30.7* 27.7*  MCV 73.0*  --  71.5* 72.1* 73.1*  PLT 439*  --  436* 466* 062*   Basic Metabolic Panel: Recent Labs  Lab 08/17/18 2258 08/17/18 2304 08/18/18 0424 08/19/18 0425 08/20/18 0408 08/21/18 0356  NA 133* 129* 132* 143 142 147*  K 4.0 3.9 3.6 3.3* 3.8 3.6  CL 90* 92* 93* 104 107 113*  CO2 27  --  27 23 18* 25  GLUCOSE 115* 112* 99 66* 51* 116*  BUN 46* 45* 47* 52* 46* 35*  CREATININE 1.81* 2.00* 1.74* 1.80* 1.71* 1.42*  CALCIUM 8.4*  --  7.6* 7.3* 6.9*  7.3*  MG  --   --   --  2.1 1.8 1.9   GFR: Estimated Creatinine Clearance: 18.5 mL/min (A) (by C-G formula based on SCr of 1.42 mg/dL (H)). Liver Function Tests: Recent Labs  Lab 08/17/18 2258  AST 23  ALT 12  ALKPHOS 60  BILITOT 0.8  PROT 7.1  ALBUMIN 2.5*   Recent Labs  Lab 08/17/18 2258  LIPASE 23   No results for input(s): AMMONIA in the last 168 hours. Coagulation Profile: No results for input(s): INR, PROTIME in the last 168 hours. Cardiac Enzymes: No results for input(s): CKTOTAL, CKMB, CKMBINDEX, TROPONINI in the last 168 hours. BNP (last 3 results) No results for input(s): PROBNP in the last 8760 hours. HbA1C: No results for input(s): HGBA1C in the last 72 hours. CBG: Recent Labs  Lab 08/18/18 0332 08/18/18 0756 08/18/18 1211 08/18/18 1821  GLUCAP 100* 91 72 76   Lipid Profile: No results for input(s): CHOL, HDL, LDLCALC, TRIG, CHOLHDL, LDLDIRECT in the last 72 hours. Thyroid Function Tests: No results for input(s): TSH, T4TOTAL, FREET4, T3FREE, THYROIDAB in the last 72 hours. Anemia Panel: No results for input(s): VITAMINB12, FOLATE, FERRITIN, TIBC, IRON, RETICCTPCT in the last 72 hours. Sepsis Labs: Recent Labs  Lab 08/17/18 2357 08/18/18 0234 08/18/18 0424 08/18/18 0535  LATICACIDVEN 1.92* 1.13 1.1 1.0    Recent Results (from the past 240 hour(s))  Urine Culture     Status: Abnormal   Collection Time: 08/18/18  1:18 AM  Result Value Ref Range Status   Specimen Description   Final    URINE, CLEAN CATCH Performed at [facility] [place] [address] [place], [place] [address]    Special Requests   Final    NONE Performed at [facility], [place] [address] [place], [place] [address]    Culture >=100,000 COLONIES/mL KLEBSIELLA PNEUMONIAE (A)  Final   Report Status 08/20/2018 FINAL  Final   Organism ID, Bacteria KLEBSIELLA PNEUMONIAE (A)  Final      Susceptibility   Klebsiella pneumoniae - MIC*     AMPICILLIN >=32 RESISTANT Resistant     CEFAZOLIN <=4 SENSITIVE Sensitive     CEFTRIAXONE <=1 SENSITIVE Sensitive     CIPROFLOXACIN <=0.25 SENSITIVE Sensitive     GENTAMICIN <=1 SENSITIVE Sensitive     IMIPENEM <=0.25 SENSITIVE Sensitive     NITROFURANTOIN 64 INTERMEDIATE Intermediate     TRIMETH/SULFA <=20 SENSITIVE Sensitive     AMPICILLIN/SULBACTAM 8 SENSITIVE Sensitive     PIP/TAZO <=4 SENSITIVE Sensitive     Extended ESBL NEGATIVE Sensitive     * >=100,000 COLONIES/mL KLEBSIELLA PNEUMONIAE         Radiology Studies: Dg Abd 2 Views  Result Date: 08/20/2018 CLINICAL DATA:  Follow up small bowel obstruction EXAM: ABDOMEN - 2 VIEW COMPARISON:  08/19/2018 FINDINGS: Nasogastric catheter is noted within the stomach. Contrast material is noted throughout the colon from previous CT  examination. Small bowel dilatation is again identified. No free air is seen. Diffuse vascular calcifications are identified. No acute bony abnormality is seen. IMPRESSION: Persistent small bowel obstruction. Electronically Signed   By: Inez Catalina M.D.   On: 08/20/2018 10:23        Scheduled Meds: . heparin  5,000 Units Subcutaneous Q8H  . sodium chloride flush  3 mL Intravenous Q12H   Continuous Infusions: .  ceFAZolin (ANCEF) IV Stopped (08/20/18 2251)  . dextrose 5 % and 0.45% NaCl 50 mL/hr at 08/21/18 1032  . famotidine (PEPCID) IV Stopped (08/20/18 2218)     LOS: 3 days        Aline August, MD [facility] Pager [phone]  If 7PM-7AM, please contact night-coverage [email or web address] Password [email or web address] 08/21/2018, 10:56 AM

## 2018-08-22 DIAGNOSIS — N184 Chronic kidney disease, stage 4 (severe): Secondary | ICD-10-CM

## 2018-08-22 DIAGNOSIS — N390 Urinary tract infection, site not specified: Secondary | ICD-10-CM

## 2018-08-22 DIAGNOSIS — E872 Acidosis: Secondary | ICD-10-CM

## 2018-08-22 DIAGNOSIS — E440 Moderate protein-calorie malnutrition: Secondary | ICD-10-CM

## 2018-08-22 DIAGNOSIS — K56609 Unspecified intestinal obstruction, unspecified as to partial versus complete obstruction: Principal | ICD-10-CM

## 2018-08-22 LAB — CBC WITH DIFFERENTIAL/PLATELET
BASOS ABS: 0 10*3/uL (ref 0.0–0.1)
Basophils Relative: 0 %
EOS ABS: 0 10*3/uL (ref 0.0–0.7)
Eosinophils Relative: 0 %
HEMATOCRIT: 29.4 % — AB (ref 36.0–46.0)
Hemoglobin: 9.9 g/dL — ABNORMAL LOW (ref 12.0–15.0)
Lymphocytes Relative: 18 %
Lymphs Abs: 1.7 10*3/uL (ref 0.7–4.0)
MCH: 24.4 pg — ABNORMAL LOW (ref 26.0–34.0)
MCHC: 33.7 g/dL (ref 30.0–36.0)
MCV: 72.6 fL — ABNORMAL LOW (ref 78.0–100.0)
MONO ABS: 0.3 10*3/uL (ref 0.1–1.0)
MONOS PCT: 3 %
NEUTROS ABS: 7.8 10*3/uL — AB (ref 1.7–7.7)
Neutrophils Relative %: 79 %
Platelets: 428 10*3/uL — ABNORMAL HIGH (ref 150–400)
RBC: 4.05 MIL/uL (ref 3.87–5.11)
RDW: 18 % — AB (ref 11.5–15.5)
WBC: 9.9 10*3/uL (ref 4.0–10.5)

## 2018-08-22 LAB — BASIC METABOLIC PANEL
ANION GAP: 8 (ref 5–15)
BUN: 28 mg/dL — ABNORMAL HIGH (ref 8–23)
CALCIUM: 7.9 mg/dL — AB (ref 8.9–10.3)
CO2: 25 mmol/L (ref 22–32)
CREATININE: 1.29 mg/dL — AB (ref 0.44–1.00)
Chloride: 113 mmol/L — ABNORMAL HIGH (ref 98–111)
GFR, EST AFRICAN AMERICAN: 41 mL/min — AB (ref >60)
GFR, EST NON AFRICAN AMERICAN: 35 mL/min — AB (ref >60)
Glucose, Bld: 119 mg/dL — ABNORMAL HIGH (ref 70–99)
Potassium: 3.6 mmol/L (ref 3.5–5.1)
Sodium: 146 mmol/L — ABNORMAL HIGH (ref 135–145)

## 2018-08-22 LAB — MAGNESIUM: Magnesium: 1.7 mg/dL (ref 1.7–2.4)

## 2018-08-22 MED ORDER — TRAMADOL HCL 50 MG PO TABS: 50.0000 mg | ORAL_TABLET | Freq: Two times a day (BID) | ORAL | 0 refills | Status: AC | PRN

## 2018-08-22 MED ORDER — SODIUM CHLORIDE 0.9 % IV SOLN
1.0000 g | INTRAVENOUS | Status: DC
  Administered 2018-08-22: 1 g via INTRAVENOUS
  Filled 2018-08-22: qty 1

## 2018-08-22 MED ORDER — GLYCERIN (LAXATIVE) 2.1 G RE SUPP
1.0000 | Freq: Once | RECTAL | Status: AC
  Administered 2018-08-22: 1 via RECTAL
  Filled 2018-08-22: qty 1

## 2018-08-22 NOTE — NC FL2 (Signed)
[place] LEVEL OF CARE SCREENING TOOL     IDENTIFICATION  Patient Name: [name] Birthdate: Aug 22, 1925 Sex: female Admission Date (Current Location): 08/17/2018  [facility] [place] and [place] Number:  Herbalist and Address:  [facility],  [address], [place]      Provider Number: [id number]  Attending Physician Name and Address:  Mariel Aloe, MD  Relative Name and Phone Number:       Current Level of Care: Hospital Recommended Level of Care: [place] Prior Approval Number:    Date Approved/Denied:   PASRR Number: [id number]  Discharge Plan: SNF    Current Diagnoses: Patient Active Problem List   Diagnosis Date Noted  . Malnutrition of moderate degree 08/22/2018  . SBO (small bowel obstruction) ([place]) 08/18/2018  . Acute lower UTI 08/18/2018  . Lactic acid acidosis 08/18/2018  . CKD (chronic kidney disease), stage IV ([place]) 08/18/2018  . Heel ulcer ([place]) 08/18/2018  . Pressure ulcer, heel 08/18/2018  . Acute renal failure superimposed on stage 4 chronic kidney disease ([place])   . Non-cardiac chest pain 07/04/2017  . Aortic atherosclerosis ([place]) 07/04/2017  . AKI (acute kidney injury) ([place]) 07/04/2017  . Hypothyroidism 07/04/2017  . History of atrial flutter 04/19/2013  . CAD (coronary artery disease) 04/19/2013  . Dizziness 04/07/2012    Class: Acute  . PAD (peripheral artery disease) ([place]) 07/07/2011  . Weight loss 06/01/2011  . Wears dentures 06/01/2011  . Hypertension 06/01/2011  . Heart disease 06/01/2011  . Hyperlipidemia 06/01/2011  . Abdominal pain 06/01/2011  . Microcytic anemia 06/01/2011  . Glaucoma or retinopathy 06/01/2011  . Arthritis 06/01/2011  . Family history of breast cancer 06/01/2011    Orientation RESPIRATION BLADDER Height & Weight     Self  Normal Incontinent Weight: 117 lb 1.6 oz (53.1 kg) Height:  5' (152.4 cm)  BEHAVIORAL SYMPTOMS/MOOD NEUROLOGICAL BOWEL  NUTRITION STATUS      Incontinent Diet(low fiber diet)  AMBULATORY STATUS COMMUNICATION OF NEEDS Skin   Total Care Verbally PU Stage and Appropriate Care, Other (Comment)(PressureInjury09/14/19StageII  Location: Coccyx Location Orientation: Mid  Foam Dressing)   PU Stage 2 Dressing: ( Location: Heel Location Orientation: Left  Foam Dressing)                   Personal Care Assistance Level of Assistance  Bathing, Feeding, Dressing Bathing Assistance: Maximum assistance Feeding assistance: Limited assistance Dressing Assistance: Maximum assistance     Functional Limitations Info  Sight, Hearing, Speech Sight Info: Adequate Hearing Info: Adequate Speech Info: Adequate    SPECIAL CARE FACTORS FREQUENCY                       Contractures      Additional Factors Info  Code Status, Allergies Code Status Info: Partial Allergies Info: Morphine And Related;Penicillins           Current Medications (08/22/2018):  This is the current hospital active medication list Current Facility-Administered Medications  Medication Dose Route Frequency Provider Last Rate Last Dose  . acetaminophen (TYLENOL) tablet 650 mg  650 mg Oral Q6H PRN Fuller Plan A, MD       Or  . acetaminophen (TYLENOL) suppository 650 mg  650 mg Rectal Q6H PRN Smith, Rondell A, MD      . cefTRIAXone (ROCEPHIN) 1 g in sodium chloride 0.9 % 100 mL IVPB  1 g Intravenous Q24H Mariel Aloe, MD      .  dextrose 5 %-0.45 % sodium chloride infusion   Intravenous Continuous Starla Link, Kshitiz, MD 50 mL/hr at 08/22/18 0600    . famotidine (PEPCID) IVPB 20 mg premix  20 mg Intravenous Q24H Alekh, Kshitiz, MD 100 mL/hr at 08/21/18 2140 20 mg at 08/21/18 2140  . feeding supplement (ENSURE ENLIVE) (ENSURE ENLIVE) liquid 237 mL  237 mL Oral BID BM Alekh, Kshitiz, MD   237 mL at 08/22/18 1446  . fentaNYL (SUBLIMAZE) injection 12.5-25 mcg  12.5-25 mcg Intravenous Q2H PRN Fuller Plan A, MD      . heparin injection 5,000  Units  5,000 Units Subcutaneous Q8H Fuller Plan A, MD   5,000 Units at 08/22/18 1446  . multivitamin with minerals tablet 1 tablet  1 tablet Oral Daily Aline August, MD   1 tablet at 08/22/18 1059  . ondansetron (ZOFRAN) tablet 4 mg  4 mg Oral Q6H PRN Fuller Plan A, MD       Or  . ondansetron (ZOFRAN) injection 4 mg  4 mg Intravenous Q6H PRN Smith, Rondell A, MD      . phenol (CHLORASEPTIC) mouth spray 1 spray  1 spray Mouth/Throat PRN Alekh, Kshitiz, MD      . sodium chloride flush (NS) 0.9 % injection 3 mL  3 mL Intravenous Q12H Smith, Rondell A, MD   3 mL at 08/22/18 1059     Discharge Medications: Please see discharge summary for a list of discharge medications.  Relevant Imaging Results:  Relevant Lab Results:   Additional Information SSN [id number]  Burnis Medin, LCSW

## 2018-08-22 NOTE — Discharge Summary (Signed)
Physician Discharge Summary  [name] YTK:[id number] DOB: [birth year] DOA: 08/17/2018  PCP: Janifer Adie, MD  Admit date: 08/17/2018 Discharge date: 08/22/2018  Admitted From: Home Disposition: SNF  Recommendations for Outpatient Follow-up:  1. Follow up with PCP in 1 week 2. Please obtain BMP/CBC in one week 3. Low fiber diet for several weeks 4. Please follow up on the following pending results: None  Home Health: SNF Equipment/Devices: None  Discharge Condition: Stable CODE STATUS: Partial (Do not intubate) Diet recommendation: Low-fiber diet   Brief/Interim Summary:  Admission HPI written by Norval Morton, MD   HPI: [name] is a 84 y.o. female with medical history significant of HTN, HLD, A. fib, DVT, P PAD, mesenteric ischemia s/p bypass, s/p right AKA, and anemia; who presents with complaints of abdominal pain with nausea and vomiting over the last 7 days.  She points to the middle of her stomach when asked where the pain is.  She had been having limited p.o. intake and yesterday reports being unable to keep any no acute amount of food or liquids down.  Emesis was noted to be nonbloody in appearance.  Last bowel movement was loose and reported to be 3-4 days ago.  She still able to pass flatus.  Previous surgeries include bypass surgery in 2012 and hysterectomy.  At baseline patient bedbound and family report utilizing a Hoyer lift at home.  Other associated symptoms include a pressure ulcer of the left heel and left shoulder dislocation.  Patient denies having any shortness of breath, chest pain, or bleeding.   ED Course: Upon admission to the emergency department patient was seen to be afebrile with vital signs relatively within normal limits.  Labs revealed WBC 9.6, hemoglobin 12.8, platelets 439, sodium 133, BUN 46, creatinine 1.81, and lactic acid 1.92.  Chest x-ray showed cardiomegaly with atelectasis.  CT scan of the abdomen and pelvis revealed  signs of small bowel obstruction with signs of possible fluid overload.  General surgery was consulted and NGT to be placed to suction.  TRH called to admit for small bowel obstruction.   Hospital course:  Small bowel obstruction General surgery consulted. NG tube placed. Intermittent suction. NPO initially. Patient improved while NPO with NG and NG removed. Diet advanced with good toleration by patient. Recommend for discharge on fiber diet.  UTI Klebsiella pneumonia. Treated with Ceftriaxone initially and switched to Ancef. Given one dose of ceftriaxone prior to discharge for a 5 day course.  Chronic kidney disease, stage IV Stable.  Thrombocytosis Stable. Outpatient follow-up  Stage II pressure ulcer located on coccyx and left heel  Chronic dislocated shoulder Outpatient orthopedic surgery follow-up  Peripheral arteria disease History of mesenteric ischemia S/p Right AKA.  Hypernatremia Mild. Iatrogenic. Patient was on normal saline. Switched to D5 NS and then to D5 1/2 NS. Recheck as an outpatient. Taking well by mouth. No mental status changes.  Discharge Diagnoses:  Principal Problem:   SBO (small bowel obstruction) (HCC) Active Problems:   Hypertension   Acute lower UTI   Lactic acid acidosis   CKD (chronic kidney disease), stage IV (HCC)   Heel ulcer (HCC)   Pressure ulcer, heel   Malnutrition of moderate degree    Discharge Instructions  Discharge Instructions    Call MD for:  persistant nausea and vomiting   Complete by:  As directed    Call MD for:  severe uncontrolled pain   Complete by:  As directed  Increase activity slowly   Complete by:  As directed      Allergies as of 08/22/2018      Reactions   Morphine And Related Anaphylaxis   Penicillins Other (See Comments)   Makes patient sick Has patient had a PCN reaction causing immediate rash, facial/tongue/throat swelling, SOB or lightheadedness with hypotension: No Has patient had a PCN  reaction causing severe rash involving mucus membranes or skin necrosis: No Has patient had a PCN reaction that required hospitalization: No Has patient had a PCN reaction occurring within the last 10 years: No If all of the above answers are "NO", then may proceed with Cephalosporin use.      Medication List    STOP taking these medications   oxyCODONE-acetaminophen 5-325 MG tablet Commonly known as:  PERCOCET/ROXICET     TAKE these medications   amiodarone 100 MG tablet Commonly known as:  PACERONE Take 100 mg by mouth daily.   aspirin 81 MG EC tablet Take 1 tablet (81 mg total) by mouth daily.   calcium citrate 950 MG tablet Commonly known as:  CALCITRATE - dosed in mg elemental calcium Take 200 mg of elemental calcium by mouth 2 (two) times daily.   Coenzyme Q10 100 MG capsule Take 200 mg by mouth daily.   feeding supplement (ENSURE ENLIVE) Liqd Take 237 mLs by mouth daily.   furosemide 40 MG tablet Commonly known as:  LASIX Take 40 mg by mouth daily.   levothyroxine 88 MCG tablet Commonly known as:  SYNTHROID, LEVOTHROID Take 88 mcg by mouth daily before breakfast.   ondansetron 4 MG tablet Commonly known as:  ZOFRAN Take 4 mg by mouth every 8 (eight) hours as needed for nausea or vomiting.   pantoprazole 20 MG tablet Commonly known as:  PROTONIX Take 20 mg by mouth daily.   pravastatin 40 MG tablet Commonly known as:  PRAVACHOL Take 40 mg by mouth daily.   sennosides-docusate sodium 8.6-50 MG tablet Commonly known as:  SENOKOT-S Take 1 tablet by mouth daily as needed for constipation.   traMADol 50 MG tablet Commonly known as:  ULTRAM Take 1 tablet (50 mg total) by mouth every 12 (twelve) hours as needed for severe pain.   Vitamin D (Ergocalciferol) 50000 units Caps capsule Commonly known as:  DRISDOL Take 50,000 Units by mouth every 7 (seven) days. SATURDAY       Allergies  Allergen Reactions  . Morphine And Related Anaphylaxis  .  Penicillins Other (See Comments)    Makes patient sick Has patient had a PCN reaction causing immediate rash, facial/tongue/throat swelling, SOB or lightheadedness with hypotension: No Has patient had a PCN reaction causing severe rash involving mucus membranes or skin necrosis: No Has patient had a PCN reaction that required hospitalization: No Has patient had a PCN reaction occurring within the last 10 years: No If all of the above answers are "NO", then may proceed with Cephalosporin use.     Consultations:  General surgery   Procedures/Studies: Ct Abdomen Pelvis Wo Contrast  Result Date: 08/17/2018 CLINICAL DATA:  Nausea and vomiting.  Generalized weakness. EXAM: CT ABDOMEN AND PELVIS WITHOUT CONTRAST TECHNIQUE: Multidetector CT imaging of the abdomen and pelvis was performed following the standard protocol without IV contrast. COMPARISON:  Contrast-enhanced exam 05/30/2018 FINDINGS: Lower chest: Calcified right hilar nodes. Moderate cardiomegaly with coronary artery calcifications. Small right and trace left pleural effusion. Basilar septal thickening suspicious for pulmonary edema. Hepatobiliary: No focal hepatic lesion. Post cholecystectomy. Common bile duct  not well visualized on noncontrast exam. Pancreas: Parenchymal atrophy. No ductal dilatation or inflammation. Spleen: Small in size. Adrenals/Urinary Tract: No adrenal nodule. Lobular bilateral renal contours without hydronephrosis. Urinary bladder near completely empty. Stomach/Bowel: Gastric distention with fluid and ingested contents. Diffuse fluid-filled small bowel dilatation with transition point in the right upper quadrant image 38 series 2 and image 35 series 5. More distal small bowel nondistended. There is diffuse mesenteric stranding. No pneumatosis. Colon is nondistended with small volume of colonic stool. Diverticulosis of the descending and sigmoid colon. Vascular/Lymphatic: Dense aortic and branch atherosclerosis. No  aneurysm. Limited assessment for adenopathy. Reproductive: Post hysterectomy.  No gross adnexal mass. Other: Moderate volume of ascites with free fluid in the pelvis, right pericolic gutter and right upper quadrant. No free air. Musculoskeletal: Scoliosis and multilevel degenerative change in the spine. The bones are under mineralized. Diffuse body wall edema, increased from prior exam. IMPRESSION: 1. Small bowel obstruction transition point in the right upper quadrant, possibly due to adhesions. 2. Moderate volume abdominopelvic ascites. This may be reactive in the setting of bowel obstruction, however there is also body wall edema, right pleural effusion, cardiomegaly and possible mild pulmonary edema at the bases raising concern for fluid overload. 3. Colonic diverticulosis without diverticulitis. 4. Advanced Aortic Atherosclerosis (ICD10-I70.0). Electronically Signed   By: Keith Rake M.D.   On: 08/17/2018 23:52   Dg Chest Port 1 View  Result Date: 08/17/2018 CLINICAL DATA:  Cough, difficulty swallowing EXAM: PORTABLE CHEST 1 VIEW COMPARISON:  Radiograph 12/08/2017 FINDINGS: Cardiomegaly. Atelectasis or scar at the right base. No focal consolidation or pleural effusion. Aortic atherosclerosis. No pneumothorax. Advanced degenerative change of the right shoulder. Clips in the right upper quadrant. IMPRESSION: No active disease. Cardiomegaly with atelectasis or scar at the right base Electronically Signed   By: Donavan Foil M.D.   On: 08/17/2018 22:32   Dg Abd 2 Views  Result Date: 08/20/2018 CLINICAL DATA:  Follow up small bowel obstruction EXAM: ABDOMEN - 2 VIEW COMPARISON:  08/19/2018 FINDINGS: Nasogastric catheter is noted within the stomach. Contrast material is noted throughout the colon from previous CT examination. Small bowel dilatation is again identified. No free air is seen. Diffuse vascular calcifications are identified. No acute bony abnormality is seen. IMPRESSION: Persistent small  bowel obstruction. Electronically Signed   By: Inez Catalina M.D.   On: 08/20/2018 10:23   Dg Abd 2 Views  Result Date: 08/19/2018 CLINICAL DATA:  Small bowel obstruction, NG tube draining yellow fluid EXAM: ABDOMEN - 2 VIEW COMPARISON:  08/18/2018 FINDINGS: Dilated loops of small bowel in the left mid/lower abdomen, compatible with small bowel obstruction. Enteric tube terminates in the proximal stomach. Degenerative changes of the lumbar spine. Vascular calcifications. Cholecystectomy clips. IMPRESSION: Dilated loops of small bowel in the left mid/lower abdomen, compatible with small bowel obstruction. Enteric tube terminates in the proximal stomach. Electronically Signed   By: Julian Hy M.D.   On: 08/19/2018 09:10   Dg Abd Portable 1v-small Bowel Obstruction Protocol-initial, 8 Hr Delay  Result Date: 08/18/2018 CLINICAL DATA:  8 hour delay film for small-bowel protocol EXAM: PORTABLE ABDOMEN - 1 VIEW COMPARISON:  08/18/2018 FINDINGS: Nasogastric catheter is again noted within the stomach. Contrast material is seen within the stomach. Persistent small bowel dilatation is noted. No significant contrast material has passed beyond the stomach. Degenerative changes of the lumbar spine are seen. Diffuse vascular calcifications are again noted. IMPRESSION: Contrast remains within the stomach. Little or no contrast has passed into the  small bowel. Electronically Signed   By: Inez Catalina M.D.   On: 08/18/2018 14:14   Dg Abd Portable 1v-small Bowel Protocol-position Verification  Result Date: 08/18/2018 CLINICAL DATA:  NG tube placement. EXAM: PORTABLE ABDOMEN - 1 VIEW COMPARISON:  CT yesterday. FINDINGS: Tip and side port of the enteric tube below the diaphragm in the stomach. Persistent small bowel dilatation consistent with obstruction is seen on CT. IMPRESSION: Tip and side port of the enteric tube below the diaphragm in the stomach. Electronically Signed   By: Keith Rake M.D.   On: 08/18/2018  01:42      Subjective: No abdominal pain, nausea or vomiting. Good appetite.   Discharge Exam: Vitals:   08/21/18 2141 08/22/18 0537  BP: (!) 152/71 (!) 158/77  Pulse: 76 74  Resp: 16 17  Temp: 97.8 F (36.6 C) 98.2 F (36.8 C)  SpO2: 100% 100%   Vitals:   08/21/18 0615 08/21/18 1338 08/21/18 2141 08/22/18 0537  BP:  (!) 139/92 (!) 152/71 (!) 158/77  Pulse:  79 76 74  Resp:  18 16 17   Temp:  98 F (36.7 C) 97.8 F (36.6 C) 98.2 F (36.8 C)  TempSrc:  Oral Oral Oral  SpO2:  98% 100% 100%  Weight:      Height: 5' (1.524 m)       General: Pt is alert, awake, not in acute distress Cardiovascular: RRR, S1/S2 +, no rubs, no gallops Respiratory: CTA bilaterally, no wheezing, no rhonchi Abdominal: Soft, NT, ND, bowel sounds + Extremities: no edema, no cyanosis    The results of significant diagnostics from this hospitalization (including imaging, microbiology, ancillary and laboratory) are listed below for reference.     Microbiology: Recent Results (from the past 240 hour(s))  Urine Culture     Status: Abnormal   Collection Time: 08/18/18  1:18 AM  Result Value Ref Range Status   Specimen Description   Final    URINE, CLEAN CATCH Performed at [facility], [place] [address], [place], [place] [address]    Special Requests   Final    NONE Performed at [facility], [place] [address] [place], [place] [address]    Culture >=100,000 COLONIES/mL KLEBSIELLA PNEUMONIAE (A)  Final   Report Status 08/20/2018 FINAL  Final   Organism ID, Bacteria KLEBSIELLA PNEUMONIAE (A)  Final      Susceptibility   Klebsiella pneumoniae - MIC*    AMPICILLIN >=32 RESISTANT Resistant     CEFAZOLIN <=4 SENSITIVE Sensitive     CEFTRIAXONE <=1 SENSITIVE Sensitive     CIPROFLOXACIN <=0.25 SENSITIVE Sensitive     GENTAMICIN <=1 SENSITIVE Sensitive     IMIPENEM <=0.25 SENSITIVE Sensitive     NITROFURANTOIN 64 INTERMEDIATE Intermediate     TRIMETH/SULFA  <=20 SENSITIVE Sensitive     AMPICILLIN/SULBACTAM 8 SENSITIVE Sensitive     PIP/TAZO <=4 SENSITIVE Sensitive     Extended ESBL NEGATIVE Sensitive     * >=100,000 COLONIES/mL KLEBSIELLA PNEUMONIAE     Labs: BNP (last 3 results) No results for input(s): BNP in the last 8760 hours. Basic Metabolic Panel: Recent Labs  Lab 08/18/18 0424 08/19/18 0425 08/20/18 0408 08/21/18 0356 08/22/18 0403  NA 132* 143 142 147* 146*  K 3.6 3.3* 3.8 3.6 3.6  CL 93* 104 107 113* 113*  CO2 27 23 18* 25 25  GLUCOSE 99 66* 51* 116* 119*  BUN 47* 52* 46* 35* 28*  CREATININE 1.74* 1.80* 1.71* 1.42* 1.29*  CALCIUM 7.6* 7.3* 6.9* 7.3* 7.9*  MG  --  2.1 1.8 1.9 1.7   Liver Function Tests: Recent Labs  Lab 08/17/18 2258  AST 23  ALT 12  ALKPHOS 60  BILITOT 0.8  PROT 7.1  ALBUMIN 2.5*   Recent Labs  Lab 08/17/18 2258  LIPASE 23   No results for input(s): AMMONIA in the last 168 hours. CBC: Recent Labs  Lab 08/17/18 2258 08/17/18 2304 08/18/18 0424 08/19/18 0425 08/20/18 0408 08/22/18 0403  WBC 9.6  --  9.1 9.4 10.2 9.9  NEUTROABS 8.4*  --   --  7.4 8.0* 7.8*  HGB 12.8 13.6 10.5* 10.5* 9.4* 9.9*  HCT 36.8 40.0 30.4* 30.7* 27.7* 29.4*  MCV 73.0*  --  71.5* 72.1* 73.1* 72.6*  PLT 439*  --  436* 466* 426* 428*   Cardiac Enzymes: No results for input(s): CKTOTAL, CKMB, CKMBINDEX, TROPONINI in the last 168 hours. BNP: Invalid input(s): POCBNP CBG: Recent Labs  Lab 08/18/18 0332 08/18/18 0756 08/18/18 1211 08/18/18 1821  GLUCAP 100* 91 72 76   D-Dimer No results for input(s): DDIMER in the last 72 hours. Hgb A1c No results for input(s): HGBA1C in the last 72 hours. Lipid Profile No results for input(s): CHOL, HDL, LDLCALC, TRIG, CHOLHDL, LDLDIRECT in the last 72 hours. Thyroid function studies No results for input(s): TSH, T4TOTAL, T3FREE, THYROIDAB in the last 72 hours.  Invalid input(s): FREET3 Anemia work up No results for input(s): VITAMINB12, FOLATE, FERRITIN, TIBC,  IRON, RETICCTPCT in the last 72 hours. Urinalysis    Component Value Date/Time   COLORURINE AMBER (A) 08/18/2018 0118   APPEARANCEUR CLOUDY (A) 08/18/2018 0118   LABSPEC 1.011 08/18/2018 0118   PHURINE 6.0 08/18/2018 0118   GLUCOSEU NEGATIVE 08/18/2018 0118   HGBUR NEGATIVE 08/18/2018 0118   BILIRUBINUR NEGATIVE 08/18/2018 0118   KETONESUR 5 (A) 08/18/2018 0118   PROTEINUR 100 (A) 08/18/2018 0118   UROBILINOGEN 0.2 04/07/2012 1646   NITRITE NEGATIVE 08/18/2018 0118   LEUKOCYTESUR LARGE (A) 08/18/2018 0118   Sepsis Labs Invalid input(s): PROCALCITONIN,  WBC,  LACTICIDVEN Microbiology Recent Results (from the past 240 hour(s))  Urine Culture     Status: Abnormal   Collection Time: 08/18/18  1:18 AM  Result Value Ref Range Status   Specimen Description   Final    URINE, CLEAN CATCH Performed at [facility], [place] [address], [place], [place] [address]    Special Requests   Final    NONE Performed at [facility], [place] [address], [place], [place] [address]    Culture >=100,000 COLONIES/mL KLEBSIELLA PNEUMONIAE (A)  Final   Report Status 08/20/2018 FINAL  Final   Organism ID, Bacteria KLEBSIELLA PNEUMONIAE (A)  Final      Susceptibility   Klebsiella pneumoniae - MIC*    AMPICILLIN >=32 RESISTANT Resistant     CEFAZOLIN <=4 SENSITIVE Sensitive     CEFTRIAXONE <=1 SENSITIVE Sensitive     CIPROFLOXACIN <=0.25 SENSITIVE Sensitive     GENTAMICIN <=1 SENSITIVE Sensitive     IMIPENEM <=0.25 SENSITIVE Sensitive     NITROFURANTOIN 64 INTERMEDIATE Intermediate     TRIMETH/SULFA <=20 SENSITIVE Sensitive     AMPICILLIN/SULBACTAM 8 SENSITIVE Sensitive     PIP/TAZO <=4 SENSITIVE Sensitive     Extended ESBL NEGATIVE Sensitive     * >=100,000 COLONIES/mL KLEBSIELLA PNEUMONIAE    SIGNED:   Cordelia Poche, MD [facility] 08/22/2018, 3:28 PM

## 2018-08-22 NOTE — Care Management Important Message (Signed)
Important Message  Patient Details  Name: [name] MRN: [id number] Date of Birth: May 08, 1925   Medicare Important Message Given:  Yes    Kerin Salen 08/22/2018, 10:41 AMImportant Message  Patient Details  Name: [name] MRN: [id number] Date of Birth: 1925-03-09   Medicare Important Message Given:  Yes    Kerin Salen 08/22/2018, 10:41 AM

## 2018-08-22 NOTE — Clinical Social Work Placement (Signed)
Patient received and accepted bed offer at [facility]. Facility aware of patient's discharge and confirmed bed offer. PTAR contacted, patient's family aware. Patient's RN can call report to [phone], packet complete. CSW signing off, no other needs identified at this time.  CLINICAL SOCIAL WORK PLACEMENT  NOTE  Date:  08/22/2018  Patient Details  Name: [name] MRN: [id number] Date of Birth: 17-Sep-1925  Clinical Social Work is seeking post-discharge placement for this patient at the [place] level of care (*CSW will initial, date and re-position this form in  chart as items are completed):  Yes   Patient/family provided with [place] Work Department's list of facilities offering this level of care within the geographic area requested by the patient (or if unable, by the patient's family).  Yes   Patient/family informed of their freedom to choose among providers that offer the needed level of care, that participate in Medicare, Medicaid or managed care program needed by the patient, have an available bed and are willing to accept the patient.  Yes   Patient/family informed of 's ownership interest in [facility] and [facility], as well as of the fact that they are under no obligation to receive care at these facilities.  PASRR submitted to EDS on       PASRR number received on       Existing PASRR number confirmed on 08/22/18     FL2 transmitted to all facilities in geographic area requested by pt/family on 08/22/18     FL2 transmitted to all facilities within larger geographic area on       Patient informed that his/her managed care company has contracts with or will negotiate with certain facilities, including the following:        Yes   Patient/family informed of bed offers received.  Patient [facility] bed at [facility]     Physician recommends and patient chooses bed at      Patient to be  transferred to [facility] on 08/22/18.  Patient to be transferred to facility by PTAR     Patient family notified on 08/22/18 of transfer.  Name of family member notified:  [name]     PHYSICIAN       Additional Comment:    _______________________________________________ Burnis Medin, LCSW 08/22/2018, 4:29 PM

## 2018-08-22 NOTE — Progress Notes (Signed)
Discharge instructions (including medications) discussed with and copy provided to patient/caregiver 

## 2018-08-22 NOTE — Progress Notes (Signed)
[facility], SW asked for PT eval Before pt is discharged from hospital.

## 2018-08-22 NOTE — Discharge Instructions (Signed)
Low-Fiber Diet, for the next several weeks after discharge Fiber is found in fruits, vegetables, and whole grains. A low-fiber diet restricts fibrous foods that are not digested in the small intestine. A diet containing about 10-15 grams of fiber per day is considered low fiber. Low-fiber diets may be used to:  Promote healing and rest the bowel during intestinal flare-ups.  Prevent blockage of a partially obstructed or narrowed gastrointestinal tract.  Reduce fecal weight and volume.  Slow the movement of feces.  You may be on a low-fiber diet as a transitional diet following surgery, after an injury (trauma), or because of a short (acute) or lifelong (chronic) illness. Your health care provider will determine the length of time you need to stay on this diet. What do I need to know about a low-fiber diet? Always check the fiber content on the packaging's Nutrition Facts label, especially on foods from the grains list. Ask your dietitian if you have questions about specific foods that are related to your condition, especially if the food is not listed below. In general, a low-fiber food will have less than 2 g of fiber. What foods can I eat? Grains All breads and crackers made with white flour. Sweet rolls, doughnuts, waffles, pancakes, [place] toast, bagels. Pretzels, Melba toast, zwieback. Well-cooked cereals, such as cornmeal, farina, or cream cereals. Dry cereals that do not contain whole grains, fruit, or nuts, such as refined corn, wheat, rice, and oat cereals. Potatoes prepared any way without skins, plain pastas and noodles, refined white rice. Use white flour for baking and making sauces. Use allowed list of grains for casseroles, dumplings, and puddings. Vegetables Strained tomato and vegetable juices. Fresh lettuce, cucumber, spinach. Well-cooked (no skin or pulp) or canned vegetables, such as asparagus, bean sprouts, beets, carrots, green beans, mushrooms, potatoes, pumpkin, spinach,  yellow squash, tomato sauce/puree, turnips, yams, and zucchini. Keep servings limited to  cup. Fruits All fruit juices except prune juice. Cooked or canned fruits without skin and seeds, such as applesauce, apricots, cherries, fruit cocktail, grapefruit, grapes, mandarin oranges, melons, peaches, pears, pineapple, and plums. Fresh fruits without skin, such as apricots, avocados, bananas, melons, pineapple, nectarines, and peaches. Keep servings limited to  cup or 1 piece. Meat and Other Protein Sources Ground or well-cooked tender beef, ham, veal, lamb, pork, or poultry. Eggs, plain cheese. Fish, oysters, shrimp, lobster, and other seafood. Liver, organ meats. Smooth nut butters. Dairy All milk products and alternative dairy substitutes, such as soy, rice, almond, and coconut, not containing added whole nuts, seeds, or added fruit. Beverages Decaf coffee, fruit, and vegetable juices or smoothies (small amounts, with no pulp or skins, and with fruits from allowed list), sports drinks, herbal tea. Condiments Ketchup, mustard, vinegar, cream sauce, cheese sauce, cocoa powder. Spices in moderation, such as allspice, basil, bay leaves, celery powder or leaves, cinnamon, cumin powder, curry powder, ginger, mace, marjoram, onion or garlic powder, oregano, paprika, parsley flakes, ground pepper, rosemary, sage, savory, tarragon, thyme, and turmeric. Sweets and Desserts Plain cakes and cookies, pie made with allowed fruit, pudding, custard, cream pie. Gelatin, fruit, ice, sherbet, frozen ice pops. Ice cream, ice milk without nuts. Plain hard candy, honey, jelly, molasses, syrup, sugar, chocolate syrup, gumdrops, marshmallows. Limit overall sugar intake. Fats and Oil Margarine, butter, cream, mayonnaise, salad oils, plain salad dressings made from allowed foods. Choose healthy fats such as olive oil, canola oil, and omega-3 fatty acids (such as found in salmon or tuna) when possible. Other Bouillon, broth,  or  cream soups made from allowed foods. Any strained soup. Casseroles or mixed dishes made with allowed foods. The items listed above may not be a complete list of recommended foods or beverages. Contact your dietitian for more options. What foods are not recommended? Grains All whole wheat and whole grain breads and crackers. Multigrains, rye, bran seeds, nuts, or coconut. Cereals containing whole grains, multigrains, bran, coconut, nuts, raisins. Cooked or dry oatmeal, steel-cut oats. Coarse wheat cereals, granola. Cereals advertised as high fiber. Potato skins. Whole grain pasta, wild or brown rice. Popcorn. Coconut flour. Bran, buckwheat, corn bread, multigrains, rye, wheat germ. Vegetables Fresh, cooked or canned vegetables, such as artichokes, asparagus, beet greens, broccoli, Brussels sprouts, cabbage, celery, cauliflower, corn, eggplant, kale, legumes or beans, okra, peas, and tomatoes. Avoid large servings of any vegetables, especially raw vegetables. Fruits Fresh fruits, such as apples with or without skin, berries, cherries, figs, grapes, grapefruit, guavas, kiwis, mangoes, oranges, papayas, pears, persimmons, pineapple, and pomegranate. Prune juice and juices with pulp, stewed or dried prunes. Dried fruits, dates, raisins. Fruit seeds or skins. Avoid large servings of all fresh fruits. Meats and Other Protein Sources Tough, fibrous meats with gristle. Chunky nut butter. Cheese made with seeds, nuts, or other foods not recommended. Nuts, seeds, legumes (beans, including baked beans), dried peas, beans, lentils. Dairy Yogurt or cheese that contains nuts, seeds, or added fruit. Beverages Fruit juices with high pulp, prune juice. Caffeinated coffee and teas. Condiments Coconut, maple syrup, pickles, olives. Sweets and Desserts Desserts, cookies, or candies that contain nuts or coconut, chunky peanut butter, dried fruits. Jams, preserves with seeds, marmalade. Large amounts of sugar and  sweets. Any other dessert made with fruits from the not recommended list. Other Soups made from vegetables that are not recommended or that contain other foods not recommended. The items listed above may not be a complete list of foods and beverages to avoid. Contact your dietitian for more information. This information is not intended to replace advice given to you by your health care provider. Make sure you discuss any questions you have with your health care provider. Document Released: 05/13/2002 Document Revised: 04/28/2016 Document Reviewed: 10/14/2013 Elsevier Interactive Patient Education  2017 [employer].

## 2018-08-22 NOTE — Progress Notes (Signed)
Report called to [employer] and given to Leola. Voiced no questions or concerns. Family at bedside and awaiting PTAR pickup.

## 2018-08-22 NOTE — Plan of Care (Signed)
  Problem: Health Behavior/Discharge Planning: Goal: Ability to manage health-related needs will improve Outcome: Progressing   Problem: Clinical Measurements: Goal: Ability to maintain clinical measurements within normal limits will improve Outcome: Progressing Goal: Will remain free from infection Outcome: Progressing Goal: Diagnostic test results will improve Outcome: Progressing Goal: Respiratory complications will improve Outcome: Progressing Goal: Cardiovascular complication will be avoided Outcome: Progressing   Problem: Nutrition: Goal: Adequate nutrition will be maintained Outcome: Progressing   Problem: Elimination: Goal: Will not experience complications related to bowel motility Outcome: Progressing Goal: Will not experience complications related to urinary retention Outcome: Progressing   Problem: Pain Managment: Goal: General experience of comfort will improve Outcome: Progressing

## 2018-08-22 NOTE — Progress Notes (Signed)
Misty [phone] from [facility] states pt will now go to [facility] and require discharge summary faxed to [facility]. [facility], CWS infomed.

## 2018-08-22 NOTE — Final Consult Note (Signed)
Consultant Final Sign-Off Note    Assessment/Final recommendations  [name] is a 84 y.o. female followed by me for partial small bowel obstruction. This seems to have resolved as she is tolerating full liquids this morning.  Adv diet to soft and if she tolerates this today she can be DC from our standpoint later today or tomorrow per primary service.  She does not need surgical follow up.  She may follow up with her PCP prn.   Wound care (if applicable): none    Diet at discharge: Low fiber, easy to digest   Activity at discharge: per primary team   Follow-up appointment:  With PCP prn   Pending results:  Unresulted Labs (From admission, onward)   None       Medication recommendations: none from our standpoint.  Bowel regimen prn   Other recommendations:    Thank you for allowing [place] to participate in the care of your patient!  Please consult [place] again if you have further needs for your patient.  Henreitta Cea 08/22/2018 8:53 AM    Subjective  Patient feels well today.  Tolerating full liquids with no nausea/vomiting.  Had another BM yesterday.   Objective  Vital signs in last 24 hours: Temp:  [97.8 F (36.6 C)-98.2 F (36.8 C)] 98.2 F (36.8 C) (09/18 0537) Pulse Rate:  [74-79] 74 (09/18 0537) Resp:  [16-18] 17 (09/18 0537) BP: (139-158)/(71-92) 158/77 (09/18 0537) SpO2:  [98 %-100 %] 100 % (09/18 0537)  PE Heart: irregular Lungs: CTAB Abd: soft, NT, ND, +BS   Pertinent labs and Studies: Recent Labs    08/20/18 0408 08/22/18 0403  WBC 10.2 9.9  HGB 9.4* 9.9*  HCT 27.7* 29.4*   BMET Recent Labs    08/21/18 0356 08/22/18 0403  NA 147* 146*  K 3.6 3.6  CL 113* 113*  CO2 25 25  GLUCOSE 116* 119*  BUN 35* 28*  CREATININE 1.42* 1.29*  CALCIUM 7.3* 7.9*   No results for input(s): LABURIN in the last 72 hours. Results for orders placed or performed during the hospital encounter of 08/17/18  Urine Culture     Status: Abnormal   Collection Time: 08/18/18  1:18 AM  Result Value Ref Range Status   Specimen Description   Final    URINE, CLEAN CATCH Performed at [facility], [place] [address], [place], [place] [address]    Special Requests   Final    NONE Performed at [facility], [place] [address], [place], [place] [address]    Culture >=100,000 COLONIES/mL KLEBSIELLA PNEUMONIAE (A)  Final   Report Status 08/20/2018 FINAL  Final   Organism ID, Bacteria KLEBSIELLA PNEUMONIAE (A)  Final      Susceptibility   Klebsiella pneumoniae - MIC*    AMPICILLIN >=32 RESISTANT Resistant     CEFAZOLIN <=4 SENSITIVE Sensitive     CEFTRIAXONE <=1 SENSITIVE Sensitive     CIPROFLOXACIN <=0.25 SENSITIVE Sensitive     GENTAMICIN <=1 SENSITIVE Sensitive     IMIPENEM <=0.25 SENSITIVE Sensitive     NITROFURANTOIN 64 INTERMEDIATE Intermediate     TRIMETH/SULFA <=20 SENSITIVE Sensitive     AMPICILLIN/SULBACTAM 8 SENSITIVE Sensitive     PIP/TAZO <=4 SENSITIVE Sensitive     Extended ESBL NEGATIVE Sensitive     * >=100,000 COLONIES/mL KLEBSIELLA PNEUMONIAE    Imaging: No results found.

## 2018-08-22 NOTE — Evaluation (Signed)
Physical Therapy Evaluation Patient Details Name: [name] MRN: [id number] DOB: [birth year] Today's Date: 08/22/2018   History of Present Illness  84 year old female with history of hypertension, hyperlipidemia, A. fib, DVT, peripheral arterial disease, mesenteric ischemia status post bypass, status post right AKA, anemia presented with abdominal pain with nausea and vomiting.  CT scan of abdomen and pelvis showed small bowel obstruction   Clinical Impression  The patient reports that a lift is used for all activities out of bed. She is at baseline per patient. Recommend mechanical lift to be used. Patient is very fragile, Shoulders  And neck discomfort when sitting at bed edge. PT signing off.     Follow Up Recommendations No PT follow up    Equipment Recommendations  None recommended by PT    Recommendations for Other Services       Precautions / Restrictions Precautions Precautions: Fall Precaution Comments: mechanical lift OOB      Mobility  Bed Mobility Overal bed mobility: Needs Assistance Bed Mobility: Rolling;Supine to Sit Rolling: Max assist   Supine to sit: Total assist;+2 for physical assistance;+2 for safety/equipment     General bed mobility comments: patient is  rigid with attempts for sitting  Transfers Overall transfer level: Needs assistance               General transfer comment: patient has not balance, therfore attempt to scoot transfer is prohibite. Patient states that a lift is used at all times.  Ambulation/Gait                Stairs            Wheelchair Mobility    Modified Rankin (Stroke Patients Only)       Balance Overall balance assessment: Needs assistance Sitting-balance support: Feet supported;Bilateral upper extremity supported Sitting balance-Leahy Scale: Zero Sitting balance - Comments: requires support at all times Postural control: Posterior lean                                    Pertinent Vitals/Pain Pain Assessment: Faces Faces Pain Scale: Hurts even more Pain Location: shoulders, buttocks Pain Descriptors / Indicators: Discomfort Pain Intervention(s): Monitored during session;Limited activity within patient's tolerance    Home Living Family/patient expects to be discharged to:: Private residence   Available Help at Discharge: Family;Personal care attendant           Home Equipment: Wheelchair - power;Bedside commode Additional Comments: goes to [facility], [place] lift at home    Prior Function Level of Independence: Needs assistance   Gait / Transfers Assistance Needed: uses lift for OOB per patient. doesnot use sliding boar. Has buttock sore per patient due to board.  ADL's / Homemaking Assistance Needed: caregivers        Hand Dominance        Extremity/Trunk Assessment   Upper Extremity Assessment Upper Extremity Assessment: Generalized weakness    Lower Extremity Assessment Lower Extremity Assessment: RLE deficits/detail;LLE deficits/detail RLE Deficits / Details: AKA LLE Deficits / Details: unable to move well    Cervical / Trunk Assessment Cervical / Trunk Assessment: Kyphotic  Communication      Cognition Arousal/Alertness: Awake/alert Behavior During Therapy: WFL for tasks assessed/performed Overall Cognitive Status: Within Functional Limits for tasks assessed  General Comments      Exercises     Assessment/Plan    PT Assessment Patent does not need any further PT services  PT Problem List         PT Treatment Interventions      PT Goals (Current goals can be found in the Care Plan section)  Acute Rehab PT Goals Patient Stated Goal: none stated PT Goal Formulation: All assessment and education complete, DC therapy    Frequency     Barriers to discharge        Co-evaluation               AM-PAC PT "6 Clicks" Daily Activity   Outcome Measure Difficulty turning over in bed (including adjusting bedclothes, sheets and blankets)?: Unable Difficulty moving from lying on back to sitting on the side of the bed? : Unable Difficulty sitting down on and standing up from a chair with arms (e.g., wheelchair, bedside commode, etc,.)?: Unable Help needed moving to and from a bed to chair (including a wheelchair)?: Total Help needed walking in hospital room?: Total Help needed climbing 3-5 steps with a railing? : Total 6 Click Score: 6    End of Session   Activity Tolerance: Patient limited by pain Patient left: in chair;with nursing/sitter in room Nurse Communication: Mobility status;Need for lift equipment      Time: 1200-1232 PT Time Calculation (min) (ACUTE ONLY): 32 min   Charges:   PT Evaluation $PT Eval Low Complexity: 1 Low PT Treatments $Therapeutic Activity: 8-22 mins        Tresa Endo PT Acute Rehabilitation Services Pager [phone] Office [phone]   [name] 08/22/2018, 1:18 PM

## 2018-08-22 NOTE — Progress Notes (Signed)
CSW informed by patient's RNCM that patient plans to discharge to [facility], which has been arranged [facility]. RNCM reported that patient will need PTAR for transport.  CSW followed up with [facility] who confirmed bed for patient at [facility].   CSW contacted patient's son Dalton Mille [phone]) who confirmed plan for patient to be discharged to [facility].  CSW sent clinical documents via the HUB to [facility].   CSW will continue to follow and assist with discharge planning.  Abundio Miu, [place] Social Worker [facility] Cell#: [phone]

## 2018-10-19 ENCOUNTER — Other Ambulatory Visit: Payer: Self-pay

## 2018-10-19 DIAGNOSIS — I739 Peripheral vascular disease, unspecified: Secondary | ICD-10-CM

## 2018-10-22 ENCOUNTER — Encounter: Payer: Medicare (Managed Care) | Admitting: Surgery

## 2018-10-22 ENCOUNTER — Encounter (HOSPITAL_COMMUNITY): Payer: Medicare (Managed Care)

## 2018-10-23 ENCOUNTER — Telehealth: Payer: Self-pay

## 2018-10-23 NOTE — Telephone Encounter (Signed)
Returned Rise Paganini, NP with [place] phone call regarding getting patient rescheduled sooner than January 2020. At this time there are no earlier appts. She has been put on wait list. Rise Paganini verbalized understanding.

## 2018-10-24 ENCOUNTER — Ambulatory Visit (HOSPITAL_COMMUNITY)
Admission: RE | Admit: 2018-10-24 | Discharge: 2018-10-24 | Disposition: A | Discharge status: Home / Self Care | Payer: Medicare (Managed Care) | Source: Ambulatory Visit | Attending: Surgery | Admitting: Surgery

## 2018-10-24 ENCOUNTER — Other Ambulatory Visit: Payer: Self-pay

## 2018-10-24 ENCOUNTER — Encounter: Payer: Self-pay | Admitting: Vascular Surgery

## 2018-10-24 ENCOUNTER — Encounter

## 2018-10-24 ENCOUNTER — Ambulatory Visit (INDEPENDENT_AMBULATORY_CARE_PROVIDER_SITE_OTHER): Payer: Medicare (Managed Care) | Admitting: Vascular Surgery

## 2018-10-24 VITALS — BP 100/64 | HR 70 | Temp 98.8°F | Resp 14 | Ht 64.0 in | Wt 127.0 lb

## 2018-10-24 DIAGNOSIS — I739 Peripheral vascular disease, unspecified: Secondary | ICD-10-CM | POA: Diagnosis present

## 2018-10-24 DIAGNOSIS — I70263 Atherosclerosis of native arteries of extremities with gangrene, bilateral legs: Secondary | ICD-10-CM

## 2018-10-24 NOTE — Progress Notes (Signed)
REASON FOR CONSULT:    Severe peripheral vascular disease with nonhealing left leg wounds.  The consult is requested by Dr. Barney Drain.  HPI:   [name] is a pleasant 84 y.o. female, who is referred with multiple nonhealing wounds of the left leg. The patient has a history of a right above-the-knee amputation.  This was done in October 2012.  She is nonambulatory.  She does not have a prosthesis.  I have reviewed the records from the referring office.  The patient does have a history of Alzheimer's dementia.  In addition the patient has a history of coronary artery disease and is status post myocardial infarction.  She is had a previous stroke.  In addition she has congestive heart failure, hypertension, stage IV chronic kidney disease, history of atrial fibrillation.  She developed the wounds on her left leg 2 to 3 months ago.  There is no specific injury to the leg they started as a blister.  She does describe some pain in her left foot at night when she is sleeping.  She does have a history of coronary artery disease and a remote myocardial infarction but has had no recent cardiac symptoms.  She also has a remote history of a stroke.  Past Medical History:  Diagnosis Date  . Anemia   . Arthritis   . Atrial fibrillation ([place])   . Blood clot in vein   . Coronary artery disease   . DVT (deep venous thrombosis) ([place])   . GERD (gastroesophageal reflux disease)   . Hyperlipidemia   . Hypertension   . Mesenteric ischemia   . Stroke [facility])     Family History  Problem Relation Age of Onset  . Hypertension Sister   . Cancer Brother   . Diabetes Paternal Aunt     SOCIAL HISTORY: Social History   Socioeconomic History  . Marital status: Single    Spouse name: Not on file  . Number of children: 6  . Years of education: Not on file  . Highest education level: Not on file  Occupational History  . Not on file  Social Needs  . Financial resource strain: Not on file    . Food insecurity:    Worry: Not on file    Inability: Not on file  . Transportation needs:    Medical: Not on file    Non-medical: Not on file  Tobacco Use  . Smoking status: Never Smoker  . Smokeless tobacco: Former Systems developer    Types: Snuff  Substance and Sexual Activity  . Alcohol use: No  . Drug use: No  . Sexual activity: Never    Birth control/protection: Post-menopausal  Lifestyle  . Physical activity:    Days per week: Not on file    Minutes per session: Not on file  . Stress: Not on file  Relationships  . Social connections:    Talks on phone: Not on file    Gets together: Not on file    Attends religious service: Not on file    Active member of club or organization: Not on file    Attends meetings of clubs or organizations: Not on file    Relationship status: Not on file  . Intimate partner violence:    Fear of current or ex partner: Not on file    Emotionally abused: Not on file    Physically abused: Not on file    Forced sexual activity: Not on file  Other Topics Concern  .  Not on file  Social History Narrative  . Not on file    Allergies  Allergen Reactions  . Morphine And Related Anaphylaxis and Other (See Comments)  . Penicillins Other (See Comments) and Nausea And Vomiting    Makes patient sick Has patient had a PCN reaction causing immediate rash, facial/tongue/throat swelling, SOB or lightheadedness with hypotension: No Has patient had a PCN reaction causing severe rash involving mucus membranes or skin necrosis: No Has patient had a PCN reaction that required hospitalization: No Has patient had a PCN reaction occurring within the last 10 years: No If all of the above answers are "NO", then may proceed with Cephalosporin use.     Current Outpatient Medications  Medication Sig Dispense Refill  . amiodarone (PACERONE) 100 MG tablet Take 100 mg by mouth daily.    Marland Kitchen aspirin EC 81 MG EC tablet Take 1 tablet (81 mg total) by mouth daily.    . calcium  citrate (CALCITRATE - DOSED IN MG ELEMENTAL CALCIUM) 950 MG tablet Take 200 mg of elemental calcium by mouth 2 (two) times daily.    . Coenzyme Q10 100 MG capsule Take 200 mg by mouth daily.     . feeding supplement, ENSURE ENLIVE, (ENSURE ENLIVE) LIQD Take 237 mLs by mouth daily.    . furosemide (LASIX) 40 MG tablet Take 40 mg by mouth daily.    Marland Kitchen levothyroxine (SYNTHROID, LEVOTHROID) 88 MCG tablet Take 88 mcg by mouth daily before breakfast.    . ondansetron (ZOFRAN) 4 MG tablet Take 4 mg by mouth every 8 (eight) hours as needed for nausea or vomiting.    . pantoprazole (PROTONIX) 20 MG tablet Take 20 mg by mouth daily.    . pravastatin (PRAVACHOL) 40 MG tablet Take 40 mg by mouth daily.     . sennosides-docusate sodium (SENOKOT-S) 8.6-50 MG tablet Take 1 tablet by mouth daily as needed for constipation.    . traMADol (ULTRAM) 50 MG tablet Take 1 tablet (50 mg total) by mouth every 12 (twelve) hours as needed for severe pain. 5 tablet 0  . Vitamin D, Ergocalciferol, (DRISDOL) 50000 units CAPS capsule Take 50,000 Units by mouth every 7 (seven) days. SATURDAY     No current facility-administered medications for this visit.     REVIEW OF SYSTEMS:  [X]  denotes positive finding, [ ]  denotes negative finding Cardiac  Comments:  Chest pain or chest pressure:    Shortness of breath upon exertion:    Short of breath when lying flat:    Irregular heart rhythm:        Vascular    Pain in calf, thigh, or hip brought on by ambulation:    Pain in feet at night that wakes you up from your sleep:  x   Blood clot in your veins:    Leg swelling:  x       Pulmonary    Oxygen at home:    Productive cough:     Wheezing:         Neurologic    Sudden weakness in arms or legs:     Sudden numbness in arms or legs:     Sudden onset of difficulty speaking or slurred speech:    Temporary loss of vision in one eye:     Problems with dizziness:         Gastrointestinal    Blood in stool:     Vomited  blood:  Genitourinary    Burning when urinating:     Blood in urine: x       Psychiatric    Major depression:         Hematologic    Bleeding problems:    Problems with blood clotting too easily:        Skin    Rashes or ulcers:        Constitutional    Fever or chills:     PHYSICAL EXAM:   Vitals:   10/24/18 1012  Resp: 14  Weight: 127 lb (57.6 kg)  Height: 5\' 4"  (1.626 m)    GENERAL: The patient is a well-nourished female, in no acute distress. The vital signs are documented above. CARDIAC: There is a regular rate and rhythm.  VASCULAR: She has a left carotid bruit. I cannot palpate femoral pulses.  She is in the wheelchair which makes it difficult to assess however. I cannot palpate pedal pulses on the left. She has monophasic Doppler signals in the left foot. PULMONARY: There is good air exchange bilaterally without wheezing or rales. ABDOMEN: Soft and non-tender with normal pitched bowel sounds.  MUSCULOSKELETAL: She has a right above-the-knee amputation. NEUROLOGIC: No focal weakness or paresthesias are detected. SKIN: She has a wound on her medial left leg which measures approximately 2 cm in width by 8 cm in length.  She also has a pressure sore on her left heel and a wound on her left medial malleolus.  She has moderate left lower extremity swelling. PSYCHIATRIC: The patient has a normal affect.  DATA:    ARTERIAL DOPPLER STUDY: I have independently interpreted her arterial Doppler study today.  On the left side she has a monophasic posterior tibial and peroneal signal.  The dorsalis pedis signal is absent.  ABI is 48%.  A toe pressure cannot be obtained.  LABS:.  I have reviewed her labs from September of this year.  GFR was 41.  Creatinine 1.29.  Hemoglobin 9.9.  Platelets 428,000.  ASSESSMENT & PLAN:   MULTILEVEL ARTERIAL OCCLUSIVE DISEASE WITH NONHEALING WOUNDS LEFT LEG: This patient has evidence of multilevel arterial occlusive disease on the left  with multiple wounds in the left leg which are extensive.  Given her age and multiple medical comorbidities, I do not think she is a candidate for revascularization.  Even if she were a candidate for revascularization and underwent successful revascularization, I think there is significant risk of these wounds not healing given the extent of the heel wound and the leg wound.  This reason I have recommended primary left above-the-knee amputation.  I do not think there is any advantage to attempting a below the knee amputation which would be associated with significant higher risk of nonhealing.  He is nonambulatory.  She would like to discuss this further with her family before making a decision I think this is perfectly reasonable.  Have instructed her to call if she decides to proceed with surgery.  We would also want to know if she developed any fever, chills, or drainage from her wounds.  Hopefully we will hear from here in the near future.   Deitra Mayo [facility] [phone]

## 2018-10-30 ENCOUNTER — Telehealth: Payer: Self-pay

## 2018-10-30 ENCOUNTER — Other Ambulatory Visit: Payer: Self-pay | Admitting: *Deleted

## 2018-10-30 ENCOUNTER — Encounter: Payer: Self-pay | Admitting: *Deleted

## 2018-10-30 NOTE — Telephone Encounter (Signed)
Spoke with Sonya from [facility] to coordinate date/time of surgery for patient's left AKA. It is scheduled for 12/9 with an arrival time of 0530. Instructed to have patient NPO after midnight and that PAT will call to make pre-op appt.

## 2018-10-30 NOTE — Telephone Encounter (Signed)
Faxed patient surgery letter to [facility] at [phone] at 1:36.

## 2018-11-05 ENCOUNTER — Other Ambulatory Visit: Payer: Self-pay

## 2018-11-05 ENCOUNTER — Emergency Department (HOSPITAL_COMMUNITY): Payer: Medicare (Managed Care)

## 2018-11-05 ENCOUNTER — Encounter (HOSPITAL_COMMUNITY): Payer: Self-pay | Admitting: Emergency Medicine

## 2018-11-05 ENCOUNTER — Inpatient Hospital Stay (HOSPITAL_COMMUNITY)
Admission: EM | Admit: 2018-11-05 | Discharge: 2018-11-10 | DRG: 240 | Disposition: A | Discharge status: Skilled Nursing Facility | Payer: Medicare (Managed Care) | Attending: Internal Medicine | Admitting: Internal Medicine

## 2018-11-05 DIAGNOSIS — L97923 Non-pressure chronic ulcer of unspecified part of left lower leg with necrosis of muscle: Secondary | ICD-10-CM

## 2018-11-05 DIAGNOSIS — N179 Acute kidney failure, unspecified: Secondary | ICD-10-CM | POA: Diagnosis present

## 2018-11-05 DIAGNOSIS — R300 Dysuria: Secondary | ICD-10-CM | POA: Diagnosis not present

## 2018-11-05 DIAGNOSIS — L03116 Cellulitis of left lower limb: Secondary | ICD-10-CM | POA: Diagnosis present

## 2018-11-05 DIAGNOSIS — I5030 Unspecified diastolic (congestive) heart failure: Secondary | ICD-10-CM | POA: Diagnosis present

## 2018-11-05 DIAGNOSIS — Z79899 Other long term (current) drug therapy: Secondary | ICD-10-CM

## 2018-11-05 DIAGNOSIS — E785 Hyperlipidemia, unspecified: Secondary | ICD-10-CM | POA: Diagnosis present

## 2018-11-05 DIAGNOSIS — D509 Iron deficiency anemia, unspecified: Secondary | ICD-10-CM | POA: Diagnosis present

## 2018-11-05 DIAGNOSIS — I340 Nonrheumatic mitral (valve) insufficiency: Secondary | ICD-10-CM | POA: Diagnosis not present

## 2018-11-05 DIAGNOSIS — E876 Hypokalemia: Secondary | ICD-10-CM | POA: Diagnosis present

## 2018-11-05 DIAGNOSIS — I4892 Unspecified atrial flutter: Secondary | ICD-10-CM | POA: Diagnosis present

## 2018-11-05 DIAGNOSIS — R7881 Bacteremia: Secondary | ICD-10-CM | POA: Diagnosis present

## 2018-11-05 DIAGNOSIS — E875 Hyperkalemia: Secondary | ICD-10-CM | POA: Diagnosis not present

## 2018-11-05 DIAGNOSIS — I361 Nonrheumatic tricuspid (valve) insufficiency: Secondary | ICD-10-CM | POA: Diagnosis not present

## 2018-11-05 DIAGNOSIS — E861 Hypovolemia: Secondary | ICD-10-CM | POA: Diagnosis present

## 2018-11-05 DIAGNOSIS — I739 Peripheral vascular disease, unspecified: Secondary | ICD-10-CM | POA: Diagnosis not present

## 2018-11-05 DIAGNOSIS — I4891 Unspecified atrial fibrillation: Secondary | ICD-10-CM | POA: Diagnosis present

## 2018-11-05 DIAGNOSIS — K5909 Other constipation: Secondary | ICD-10-CM | POA: Diagnosis not present

## 2018-11-05 DIAGNOSIS — Z7982 Long term (current) use of aspirin: Secondary | ICD-10-CM

## 2018-11-05 DIAGNOSIS — I252 Old myocardial infarction: Secondary | ICD-10-CM

## 2018-11-05 DIAGNOSIS — N184 Chronic kidney disease, stage 4 (severe): Secondary | ICD-10-CM | POA: Diagnosis present

## 2018-11-05 DIAGNOSIS — I998 Other disorder of circulatory system: Secondary | ICD-10-CM | POA: Diagnosis present

## 2018-11-05 DIAGNOSIS — D631 Anemia in chronic kidney disease: Secondary | ICD-10-CM | POA: Diagnosis not present

## 2018-11-05 DIAGNOSIS — G309 Alzheimer's disease, unspecified: Secondary | ICD-10-CM | POA: Diagnosis present

## 2018-11-05 DIAGNOSIS — K219 Gastro-esophageal reflux disease without esophagitis: Secondary | ICD-10-CM | POA: Diagnosis present

## 2018-11-05 DIAGNOSIS — I70263 Atherosclerosis of native arteries of extremities with gangrene, bilateral legs: Secondary | ICD-10-CM | POA: Diagnosis not present

## 2018-11-05 DIAGNOSIS — R0602 Shortness of breath: Secondary | ICD-10-CM | POA: Diagnosis present

## 2018-11-05 DIAGNOSIS — I2510 Atherosclerotic heart disease of native coronary artery without angina pectoris: Secondary | ICD-10-CM | POA: Diagnosis present

## 2018-11-05 DIAGNOSIS — Z89611 Acquired absence of right leg above knee: Secondary | ICD-10-CM | POA: Diagnosis not present

## 2018-11-05 DIAGNOSIS — D638 Anemia in other chronic diseases classified elsewhere: Secondary | ICD-10-CM | POA: Diagnosis present

## 2018-11-05 DIAGNOSIS — I70249 Atherosclerosis of native arteries of left leg with ulceration of unspecified site: Secondary | ICD-10-CM | POA: Diagnosis present

## 2018-11-05 DIAGNOSIS — K5900 Constipation, unspecified: Secondary | ICD-10-CM | POA: Diagnosis present

## 2018-11-05 DIAGNOSIS — Z880 Allergy status to penicillin: Secondary | ICD-10-CM | POA: Diagnosis not present

## 2018-11-05 DIAGNOSIS — Z452 Encounter for adjustment and management of vascular access device: Secondary | ICD-10-CM

## 2018-11-05 DIAGNOSIS — L0390 Cellulitis, unspecified: Secondary | ICD-10-CM | POA: Diagnosis present

## 2018-11-05 DIAGNOSIS — E162 Hypoglycemia, unspecified: Secondary | ICD-10-CM | POA: Diagnosis not present

## 2018-11-05 DIAGNOSIS — R1310 Dysphagia, unspecified: Secondary | ICD-10-CM | POA: Diagnosis not present

## 2018-11-05 DIAGNOSIS — F0280 Dementia in other diseases classified elsewhere without behavioral disturbance: Secondary | ICD-10-CM | POA: Diagnosis present

## 2018-11-05 DIAGNOSIS — I130 Hypertensive heart and chronic kidney disease with heart failure and stage 1 through stage 4 chronic kidney disease, or unspecified chronic kidney disease: Secondary | ICD-10-CM | POA: Diagnosis present

## 2018-11-05 DIAGNOSIS — B9561 Methicillin susceptible Staphylococcus aureus infection as the cause of diseases classified elsewhere: Secondary | ICD-10-CM | POA: Diagnosis present

## 2018-11-05 DIAGNOSIS — Z8673 Personal history of transient ischemic attack (TIA), and cerebral infarction without residual deficits: Secondary | ICD-10-CM

## 2018-11-05 DIAGNOSIS — Z993 Dependence on wheelchair: Secondary | ICD-10-CM

## 2018-11-05 DIAGNOSIS — Z79890 Hormone replacement therapy (postmenopausal): Secondary | ICD-10-CM

## 2018-11-05 DIAGNOSIS — Z885 Allergy status to narcotic agent status: Secondary | ICD-10-CM

## 2018-11-05 DIAGNOSIS — Z89612 Acquired absence of left leg above knee: Secondary | ICD-10-CM | POA: Diagnosis not present

## 2018-11-05 DIAGNOSIS — Z86718 Personal history of other venous thrombosis and embolism: Secondary | ICD-10-CM

## 2018-11-05 LAB — COMPREHENSIVE METABOLIC PANEL
ALT: 54 U/L — AB (ref 0–44)
AST: 122 U/L — ABNORMAL HIGH (ref 15–41)
Albumin: 1.7 g/dL — ABNORMAL LOW (ref 3.5–5.0)
Alkaline Phosphatase: 108 U/L (ref 38–126)
Anion gap: 12 (ref 5–15)
BUN: 45 mg/dL — ABNORMAL HIGH (ref 8–23)
CO2: 30 mmol/L (ref 22–32)
Calcium: 7.8 mg/dL — ABNORMAL LOW (ref 8.9–10.3)
Chloride: 93 mmol/L — ABNORMAL LOW (ref 98–111)
Creatinine, Ser: 1.79 mg/dL — ABNORMAL HIGH (ref 0.44–1.00)
GFR calc non Af Amer: 24 mL/min — ABNORMAL LOW (ref >60)
GFR, EST AFRICAN AMERICAN: 28 mL/min — AB (ref >60)
Glucose, Bld: 99 mg/dL (ref 70–99)
Potassium: 3.4 mmol/L — ABNORMAL LOW (ref 3.5–5.1)
Sodium: 135 mmol/L (ref 135–145)
Total Bilirubin: 1 mg/dL (ref 0.3–1.2)
Total Protein: 7.8 g/dL (ref 6.5–8.1)

## 2018-11-05 LAB — URINALYSIS, ROUTINE W REFLEX MICROSCOPIC
BACTERIA UA: NONE SEEN
BILIRUBIN URINE: NEGATIVE
Glucose, UA: NEGATIVE mg/dL
Ketones, ur: NEGATIVE mg/dL
Leukocytes, UA: NEGATIVE
NITRITE: NEGATIVE
Protein, ur: 100 mg/dL — AB
Specific Gravity, Urine: 1.013 (ref 1.005–1.030)
pH: 6 (ref 5.0–8.0)

## 2018-11-05 LAB — CBC WITH DIFFERENTIAL/PLATELET
Abs Immature Granulocytes: 0.1 10*3/uL — ABNORMAL HIGH (ref 0.00–0.07)
BASOS ABS: 0 10*3/uL (ref 0.0–0.1)
Basophils Relative: 0 %
EOS PCT: 0 %
Eosinophils Absolute: 0 10*3/uL (ref 0.0–0.5)
HCT: 24.6 % — ABNORMAL LOW (ref 36.0–46.0)
Hemoglobin: 7.7 g/dL — ABNORMAL LOW (ref 12.0–15.0)
Immature Granulocytes: 1 %
Lymphocytes Relative: 11 %
Lymphs Abs: 2 10*3/uL (ref 0.7–4.0)
MCH: 23.8 pg — ABNORMAL LOW (ref 26.0–34.0)
MCHC: 31.3 g/dL (ref 30.0–36.0)
MCV: 75.9 fL — ABNORMAL LOW (ref 80.0–100.0)
Monocytes Absolute: 0.5 10*3/uL (ref 0.1–1.0)
Monocytes Relative: 3 %
NRBC: 0 % (ref 0.0–0.2)
Neutro Abs: 15.1 10*3/uL — ABNORMAL HIGH (ref 1.7–7.7)
Neutrophils Relative %: 85 %
Platelets: 405 10*3/uL — ABNORMAL HIGH (ref 150–400)
RBC: 3.24 MIL/uL — ABNORMAL LOW (ref 3.87–5.11)
RDW: 16.8 % — ABNORMAL HIGH (ref 11.5–15.5)
WBC: 17.7 10*3/uL — ABNORMAL HIGH (ref 4.0–10.5)

## 2018-11-05 LAB — CBG MONITORING, ED: Glucose-Capillary: 107 mg/dL — ABNORMAL HIGH (ref 70–99)

## 2018-11-05 LAB — I-STAT CG4 LACTIC ACID, ED
LACTIC ACID, VENOUS: 1.42 mmol/L (ref 0.5–1.9)
LACTIC ACID, VENOUS: 1.96 mmol/L — AB (ref 0.5–1.9)

## 2018-11-05 MED ORDER — ACETAMINOPHEN 650 MG RE SUPP: 650.0000 mg | Freq: Four times a day (QID) | RECTAL | Status: DC | PRN

## 2018-11-05 MED ORDER — ACETAMINOPHEN 325 MG PO TABS
650.0000 mg | ORAL_TABLET | Freq: Once | ORAL | Status: DC
  Filled 2018-11-05: qty 2

## 2018-11-05 MED ORDER — AMIODARONE HCL 200 MG PO TABS
100.0000 mg | ORAL_TABLET | Freq: Every day | ORAL | Status: DC
  Administered 2018-11-08 – 2018-11-10 (×3): 100 mg via ORAL
  Filled 2018-11-05 (×3): qty 1

## 2018-11-05 MED ORDER — ONDANSETRON HCL 4 MG PO TABS: 4.0000 mg | ORAL_TABLET | Freq: Four times a day (QID) | ORAL | Status: DC | PRN

## 2018-11-05 MED ORDER — SENNA 8.6 MG PO TABS: 1.0000 | ORAL_TABLET | Freq: Two times a day (BID) | ORAL | Status: DC

## 2018-11-05 MED ORDER — SODIUM CHLORIDE 0.9% FLUSH
3.0000 mL | Freq: Two times a day (BID) | INTRAVENOUS | Status: DC
  Administered 2018-11-07 – 2018-11-10 (×6): 3 mL via INTRAVENOUS

## 2018-11-05 MED ORDER — ONDANSETRON HCL 4 MG/2ML IJ SOLN
4.0000 mg | Freq: Four times a day (QID) | INTRAMUSCULAR | Status: DC | PRN
  Administered 2018-11-06: 4 mg via INTRAVENOUS

## 2018-11-05 MED ORDER — SODIUM CHLORIDE 0.9 % IV SOLN
1.0000 g | INTRAVENOUS | Status: DC
  Administered 2018-11-05: 1 g via INTRAVENOUS
  Filled 2018-11-05: qty 1

## 2018-11-05 MED ORDER — SODIUM CHLORIDE 0.9 % IV SOLN
INTRAVENOUS | Status: DC
  Administered 2018-11-05 – 2018-11-06 (×3): via INTRAVENOUS

## 2018-11-05 MED ORDER — POLYETHYLENE GLYCOL 3350 17 G PO PACK
17.0000 g | PACK | Freq: Every day | ORAL | Status: DC
  Administered 2018-11-08 – 2018-11-10 (×3): 17 g via ORAL
  Filled 2018-11-05 (×3): qty 1

## 2018-11-05 MED ORDER — VANCOMYCIN HCL IN DEXTROSE 750-5 MG/150ML-% IV SOLN: 750.0000 mg | INTRAVENOUS | Status: DC

## 2018-11-05 MED ORDER — ENOXAPARIN SODIUM 30 MG/0.3ML ~~LOC~~ SOLN
30.0000 mg | SUBCUTANEOUS | Status: DC
  Administered 2018-11-07 – 2018-11-10 (×4): 30 mg via SUBCUTANEOUS
  Filled 2018-11-05 (×5): qty 0.3

## 2018-11-05 MED ORDER — ENSURE ENLIVE PO LIQD
237.0000 mL | Freq: Every day | ORAL | Status: DC
  Administered 2018-11-07 – 2018-11-08 (×2): 237 mL via ORAL
  Filled 2018-11-05: qty 237

## 2018-11-05 MED ORDER — VANCOMYCIN HCL IN DEXTROSE 1-5 GM/200ML-% IV SOLN
1000.0000 mg | Freq: Once | INTRAVENOUS | Status: AC
  Administered 2018-11-05: 1000 mg via INTRAVENOUS
  Filled 2018-11-05: qty 200

## 2018-11-05 MED ORDER — LEVOTHYROXINE SODIUM 88 MCG PO TABS
88.0000 ug | ORAL_TABLET | Freq: Every day | ORAL | Status: DC
  Administered 2018-11-06: 88 ug via ORAL
  Filled 2018-11-05: qty 1

## 2018-11-05 MED ORDER — ACETAMINOPHEN 325 MG PO TABS: 650.0000 mg | ORAL_TABLET | Freq: Four times a day (QID) | ORAL | Status: DC | PRN

## 2018-11-05 MED ORDER — OXYCODONE-ACETAMINOPHEN 5-325 MG PO TABS
1.0000 | ORAL_TABLET | Freq: Four times a day (QID) | ORAL | Status: DC | PRN
  Administered 2018-11-05: 1 via ORAL
  Filled 2018-11-05: qty 1

## 2018-11-05 NOTE — ED Notes (Signed)
Attempted to collect lab work but patient not in room. 

## 2018-11-05 NOTE — ED Provider Notes (Signed)
[facility] EMERGENCY DEPARTMENT Provider Note   CSN: [id number] Arrival date & time: 11/05/18  4098     History   Chief Complaint Chief Complaint  Patient presents with  . Fever    HPI [name] is a 84 y.o. female.  84 year old female presents with 2-day history of increased temperature at the nursing home.  Temperature yesterday was 102 degrees.  Patient was seen by her physician today at the nursing home and she is currently scheduled to have an amputation of her left lower extremity.  Was concerned that patient could be developing sepsis secondary to cellulitis and known infection in that lower extremity and sent the patient here.  Patient herself denies any cough or congestion.  She denies any abdominal or chest discomfort.  She has not had any emesis today.  EMS called and patient transported here     Past Medical History:  Diagnosis Date  . Anemia   . Arthritis   . Atrial fibrillation ([place])   . Blood clot in vein   . Coronary artery disease   . DVT (deep venous thrombosis) ([place])   . GERD (gastroesophageal reflux disease)   . Hyperlipidemia   . Hypertension   . Mesenteric ischemia   . Stroke [facility]     Patient Active Problem List   Diagnosis Date Noted  . Malnutrition of moderate degree 08/22/2018  . SBO (small bowel obstruction) ([place]) 08/18/2018  . Acute lower UTI 08/18/2018  . Lactic acid acidosis 08/18/2018  . CKD (chronic kidney disease), stage IV ([place]) 08/18/2018  . Heel ulcer ([place]) 08/18/2018  . Pressure ulcer, heel 08/18/2018  . Acute renal failure superimposed on stage 4 chronic kidney disease ([place])   . Non-cardiac chest pain 07/04/2017  . Aortic atherosclerosis ([place]) 07/04/2017  . AKI (acute kidney injury) ([place]) 07/04/2017  . Hypothyroidism 07/04/2017  . History of atrial flutter 04/19/2013  . CAD (coronary artery disease) 04/19/2013  . Dizziness 04/07/2012    Class: Acute  . PAD (peripheral artery disease) ([place])  07/07/2011  . Weight loss 06/01/2011  . Wears dentures 06/01/2011  . Hypertension 06/01/2011  . Heart disease 06/01/2011  . Hyperlipidemia 06/01/2011  . Abdominal pain 06/01/2011  . Microcytic anemia 06/01/2011  . Glaucoma or retinopathy 06/01/2011  . Arthritis 06/01/2011  . Family history of breast cancer 06/01/2011    Past Surgical History:  Procedure Laterality Date  . abdominal aortogram    . ABDOMINAL HYSTERECTOMY  1964  . CHOLECYSTECTOMY    . RT AKA    . thrombectomy of superior mesenteric artery       OB History   None      Home Medications    Prior to Admission medications   Medication Sig Start Date End Date Taking? Authorizing Provider  amiodarone (PACERONE) 100 MG tablet Take 100 mg by mouth daily.    [provider]  aspirin EC 81 MG EC tablet Take 1 tablet (81 mg total) by mouth daily. 07/06/17   Thomasene Ripple, MD  calcium citrate (CALCITRATE - DOSED IN MG ELEMENTAL CALCIUM) 950 MG tablet Take 200 mg of elemental calcium by mouth 2 (two) times daily.    [provider]  Coenzyme Q10 100 MG capsule Take 200 mg by mouth daily.     [provider]  feeding supplement, ENSURE ENLIVE, (ENSURE ENLIVE) LIQD Take 237 mLs by mouth daily.    [provider]  furosemide (LASIX) 40 MG tablet Take 40 mg by mouth daily.  [provider]  levothyroxine (SYNTHROID, LEVOTHROID) 88 MCG tablet Take 88 mcg by mouth daily before breakfast.    [provider]  ondansetron (ZOFRAN) 4 MG tablet Take 4 mg by mouth every 8 (eight) hours as needed for nausea or vomiting.    [provider]  pantoprazole (PROTONIX) 20 MG tablet Take 20 mg by mouth daily.    [provider]  pravastatin (PRAVACHOL) 40 MG tablet Take 40 mg by mouth daily.     [provider]  sennosides-docusate sodium (SENOKOT-S) 8.6-50 MG tablet Take 1 tablet by mouth daily as needed for constipation.    [provider]  traMADol  (ULTRAM) 50 MG tablet Take 1 tablet (50 mg total) by mouth every 12 (twelve) hours as needed for severe pain. 08/22/18   Mariel Aloe, MD  Vitamin D, Ergocalciferol, (DRISDOL) 50000 units CAPS capsule Take 50,000 Units by mouth every 7 (seven) days. SATURDAY    [provider]    Family History Family History  Problem Relation Age of Onset  . Hypertension Sister   . Cancer Brother   . Diabetes Paternal Aunt     Social History Social History   Tobacco Use  . Smoking status: Never Smoker  . Smokeless tobacco: Former Systems developer    Types: Snuff  Substance Use Topics  . Alcohol use: No  . Drug use: No     Allergies   Morphine and related and Penicillins   Review of Systems Review of Systems  All other systems reviewed and are negative.    Physical Exam Updated Vital Signs BP 113/89 (BP Location: Right Arm)   Pulse 93   Temp (!) 101.9 F (38.8 C) (Rectal)   Resp 18   LMP 04/20/2018 (Exact Date)   SpO2 99%   Physical Exam  Constitutional: She is oriented to person, place, and time. She appears cachectic. She has a sickly appearance.  HENT:  Head: Normocephalic and atraumatic.  Eyes: Pupils are equal, round, and reactive to light. Conjunctivae, EOM and lids are normal.  Neck: Normal range of motion. Neck supple. No tracheal deviation present. No thyroid mass present.  Cardiovascular: Normal rate, regular rhythm and normal heart sounds. Exam reveals no gallop.  No murmur heard. Pulmonary/Chest: Effort normal and breath sounds normal. No stridor. No respiratory distress. She has no decreased breath sounds. She has no wheezes. She has no rhonchi. She has no rales.  Abdominal: Soft. Normal appearance and bowel sounds are normal. She exhibits no distension. There is no tenderness. There is no rebound and no CVA tenderness.  Musculoskeletal: Normal range of motion. She exhibits no edema or tenderness.       Feet:  Neurological: She is alert and oriented to person,  place, and time. She displays atrophy. No cranial nerve deficit or sensory deficit. GCS eye subscore is 4. GCS verbal subscore is 5. GCS motor subscore is 6.  Skin: Skin is warm and dry. There is erythema.  Psychiatric: Her affect is blunt.  Nursing note and vitals reviewed.    ED Treatments / Results  Labs (all labs ordered are listed, but only abnormal results are displayed) Labs Reviewed  CBG MONITORING, ED - Abnormal; Notable for the following components:      Result Value   Glucose-Capillary 107 (*)    All other components within normal limits  CULTURE, BLOOD (ROUTINE X 2)  CULTURE, BLOOD (ROUTINE X 2)  URINE CULTURE  COMPREHENSIVE METABOLIC PANEL  CBC WITH DIFFERENTIAL/PLATELET  URINALYSIS,  ROUTINE W REFLEX MICROSCOPIC  I-STAT CG4 LACTIC ACID, ED    EKG EKG Interpretation  Date/Time:  Monday November 05 2018 18:52:50 EST Ventricular Rate:  97 PR Interval:    QRS Duration: 110 QT Interval:  398 QTC Calculation: 506 R Axis:   -19 Text Interpretation:  Atrial fibrillation LVH with secondary repolarization abnormality Probable anterior infarct, age indeterminate Prolonged QT interval Confirmed by Lacretia Leigh ([id number]) on 11/05/2018 8:59:39 PM   Radiology No results found.  Procedures Procedures (including critical care time)  Medications Ordered in ED Medications  vancomycin (VANCOCIN) IVPB 1000 mg/200 mL premix (has no administration in time range)     Initial Impression / Assessment and Plan / ED Course  I have reviewed the triage vital signs and the nursing notes.  Pertinent labs & imaging results that were available during my care of the patient were reviewed by me and considered in my medical decision making (see chart for details).    Patient empirically started on vancomycin due to severe cellulitis of her left lower extremity. Patient with not elevated lactate.  Does have leukocytosis on CBC.  Worsening renal function as well.  Lactate normal.   Urinalysis without evidence of infection. Will admit patient to the hospital  Final Clinical Impressions(s) / ED Diagnoses   Final diagnoses:  SOB (shortness of breath)    ED Discharge Orders    None       Lacretia Leigh, MD 11/05/18 2059

## 2018-11-05 NOTE — Progress Notes (Addendum)
Pharmacy Antibiotic Note  [name] is a 84 y.o. female admitted on 11/05/2018 with cellulitis.  Pharmacy has been consulted for vancomycin dosing. Of note, pt is scheduled for left AKA on 12/9. SCr 1.79. WBC 17.7.   Plan: -Vancomycin 1 gm IV once, then vanc 750 mg IV Q 48 hours -Monitor renal fx, cultures and clinical progress -VT as indicated      Temp (24hrs), Avg:100.8 F (38.2 C), Min:99.7 F (37.6 C), Max:101.9 F (38.8 C)  Recent Labs  Lab 11/05/18 1859 11/05/18 1914  WBC 17.7*  --   CREATININE 1.79*  --   LATICACIDVEN  --  1.42    Estimated Creatinine Clearance: 17.7 mL/min (A) (by C-G formula based on SCr of 1.79 mg/dL (H)).    Allergies  Allergen Reactions  . Morphine And Related Anaphylaxis  . Penicillins Other (See Comments) and Nausea And Vomiting    Makes patient sick Has patient had a PCN reaction causing immediate rash, facial/tongue/throat swelling, SOB or lightheadedness with hypotension: No Has patient had a PCN reaction causing severe rash involving mucus membranes or skin necrosis: No Has patient had a PCN reaction that required hospitalization: No Has patient had a PCN reaction occurring within the last 10 years: No If all of the above answers are "NO", then may proceed with Cephalosporin use.     Antimicrobials this admission: Vanc 12/12 >>    Thank you for allowing pharmacy to be a part of this patient's care.  Albertina Parr, PharmD., BCPS Clinical Pharmacist Clinical phone for 11/05/18 until 11pm: [phone]  Addendum: Now adding IV Cefepime 1 gm q 24 hours for wound infection. Monitor for improvement in renal function and dosage adjustment   Albertina Parr, PharmD., BCPS Clinical Pharmacist

## 2018-11-05 NOTE — ED Notes (Signed)
Patient currently at x-ray .  

## 2018-11-05 NOTE — ED Triage Notes (Signed)
Pt arrives via EMS from [facility] with fever onset today per staff, EMS reported temp of 102 temporally. Pt hot to the touch. Last dose tylenol at about 4pm today.   Pt oxygen dependent, wears 2L [place] at all times.

## 2018-11-05 NOTE — ED Notes (Signed)
Family member up to the desk requesting pain medication for the patient; offered tylenol per orders, family stating "that will not help her" Admitting team at bedside

## 2018-11-05 NOTE — H&P (Addendum)
Date: 11/05/2018               Patient Name:  [name] MRN: [id number]  DOB: [birth year] Age / Sex: 84 y.o., female   PCP: Janifer Adie, MD         Medical Service: Internal Medicine Teaching Service         Attending Physician: Dr. Sid Falcon, MD    First Contact: Dr. Truman Hayward Pager: [phone]  Second Contact: Dr. Maricela Bo Pager: [phone]       After Hours (After 5p/  First Contact Pager: [phone]  weekends / holidays): Second Contact Pager: [phone]   Chief Complaint: fever   History of Present Illness: [name] is a 84 y/o female with PMHx of Alzheimer's, CAD, afib/flutter, HTN, CKD IV, and PVD s/p right AKA who presents for 2 days of fever and worsening pain in left lower extremity. Patient has non-healing wounds of her left lower extremity. Family endorses worsening swelling and redness. She is followed by vascular and is scheduled for left AKA on 12/09. She is a resident of [facility] who noted her to be febrile to 102 beginning last night. She denies headache, cough, URI symptoms, chest pain, palpitations, abdominal pain, n/v, or urinary symptoms.  She reports significant discomfort due to constipation. Last BM was a few nights ago. She is on Senna at [facility].    Meds:  Current Meds  Medication Sig  . acetaminophen (TYLENOL) 325 MG tablet Take 650 mg by mouth every 6 (six) hours as needed (pain). Max dose 3000 mg/24 hours  . Amino Acids-Protein Hydrolys (FEEDING SUPPLEMENT, PRO-STAT SUGAR FREE 64,) LIQD Take 30 mLs by mouth 2 (two) times daily with a meal.  . amiodarone (PACERONE) 100 MG tablet Take 100 mg by mouth daily.  Marland Kitchen aspirin EC 81 MG EC tablet Take 1 tablet (81 mg total) by mouth daily.  . bisacodyl (DULCOLAX) 10 MG suppository Place 10 mg rectally daily as needed (constipation not relieved by MOM).  . calcium citrate (CALCITRATE - DOSED IN MG ELEMENTAL CALCIUM) 950 MG tablet Take 200 mg of elemental calcium by mouth 2 (two) times daily with a meal.     . Coenzyme Q10 100 MG capsule Take 200 mg by mouth daily.   . Darbepoetin Alfa (ARANESP) 60 MCG/0.3ML SOSY injection Inject 60 mcg into the skin every 14 (fourteen) days. Every other Wednesday  . feeding supplement, ENSURE ENLIVE, (ENSURE ENLIVE) LIQD Take 237 mLs by mouth daily at 2 PM.   . furosemide (LASIX) 40 MG tablet Take 40 mg by mouth daily.  Marland Kitchen levothyroxine (SYNTHROID, LEVOTHROID) 88 MCG tablet Take 88 mcg by mouth daily before breakfast.  . Magnesium Hydroxide (MILK OF MAGNESIA PO) Take 30 mLs by mouth daily as needed (constipation).  . ondansetron (ZOFRAN) 4 MG tablet Take 4 mg by mouth every 8 (eight) hours as needed for nausea.   Marland Kitchen oxyCODONE-acetaminophen (PERCOCET/ROXICET) 5-325 MG tablet Take by mouth every 6 (six) hours as needed (left leg pain).  . pantoprazole (PROTONIX) 20 MG tablet Take 20 mg by mouth daily before breakfast.   . pravastatin (PRAVACHOL) 40 MG tablet Take 40 mg by mouth at bedtime.   . sennosides-docusate sodium (SENOKOT-S) 8.6-50 MG tablet Take 2 tablets by mouth 2 (two) times daily.   . Sodium Phosphates (FLEET ENEMA RE) Place 1 enema rectally daily as needed (constipation not relieved by Bisacodyl suppository).     Allergies: Allergies as of 11/05/2018 - Review  Complete 11/05/2018  Allergen Reaction Noted  . Morphine and related Anaphylaxis 06/01/2011  . Penicillins Other (See Comments) and Nausea And Vomiting 07/07/2011   Past Medical History:  Diagnosis Date  . Anemia   . Arthritis   . Atrial fibrillation ([place])   . Blood clot in vein   . Coronary artery disease   . DVT (deep venous thrombosis) ([place])   . GERD (gastroesophageal reflux disease)   . Hyperlipidemia   . Hypertension   . Mesenteric ischemia   . Stroke [facility])     Family History:  Family History  Problem Relation Age of Onset  . Hypertension Sister   . Cancer Brother   . Diabetes Paternal Aunt     Social History: resident of [facility]; formerly used snuff; no current  tobacco, EtOH or illicit drug use   Review of Systems: A complete ROS was negative except as per HPI.   Physical Exam: Blood pressure (!) 130/93, pulse 97, temperature (!) 101.9 F (38.8 C), temperature source Rectal, resp. rate 18, last menstrual period 04/20/2018, SpO2 99 %. General: awake, alert, intermittently moaning due to pain, frail and ill-appearing HEENT: [place]/AT; PEERL; dry mucous membranes Neck: supple; no thyromegaly CV: tachy, irregular  Pulm: normal respiratory effort; lungs CTA anteriorly Abd: abdomen is soft, non-distended, TTP in bilateral lower quadrants  Neuro: A&Ox3; follows commands and answers questions appropriately  Ext: R AKA; left lower extremity with newly wrapped dressing around foot; pitting edema that extends up to mid-shin with erythema; foot is cool but entire extremity feels warm; second toe appears necrotic; 2+ DP pulse; well healing 2 cm x 8 cm wound on medial aspect of lower extremity   EKG: personally reviewed my interpretation is atrial flutter   CXR: personally reviewed my interpretation is no pneumothorax, effusion or focal consolidation  Assessment & Plan by Problem: Active Problems:   Cellulitis  1. Left lower extremity cellulitis with nonhealing wounds: Patient is febrile with leukocytosis. Lactate negative. No other systemic signs of infection. Wounds were freshly dressed, but EDP noted purulent drainage coming from foot wounds. She was started on Vancomycin. Given necrotic second toe, will add on cefepime for gram negative and anaerobic coverage.  - blood cx pending - wound consult - would touch base with vascular since she is scheduled for amputation next week  - continue home Percocet 5-325 mg q 6 hrs PRN pain  2. AKI on CKD - patient appears dry on exam and endorses poor PO intake  - will continue IVF at 100 cc/hr - trend BMP - holding home Lasix  3. Afib/flutter - continue Amiodarone 100 mg daily; not on anticoagulation   4.  Anemia of chronic disease - on Aranesp  - patient's hgb 7.7; baseline appears to be between 9 and 10 - no signs or symptoms of active bleeding; will continue to monitor; transfuse as needed to keep Hgb >7   5. HFpEF - appears dry on exam - holding home Lasix in the setting of AKI  - monitor I&Os  6. Constipation  - chronic issue likely worsened by opioids  - no n/v concerning for obstruction; would obtain imaging if symptoms worsen - scheduled Miralax and Senokot   Diet: Regular DVT ppx: Lovenox CODE: Limited (perform CPR, administer meds; NO intubation)   Dispo: Admit patient to Inpatient with expected length of stay greater than 2 midnights.  SignedDelice Bison, DO 11/05/2018, 10:40 PM  Pager: [phone]

## 2018-11-05 NOTE — ED Notes (Signed)
Vancomycin IV infusing , blood cultures collected prior to antibiotic administration , urine specimen collected for urinalysis , IV sites intact , denies pain/respirations unlabored. Patient waiting for X-ray and admitting MD .

## 2018-11-06 ENCOUNTER — Inpatient Hospital Stay (HOSPITAL_COMMUNITY): Payer: Medicare (Managed Care) | Admitting: Critical Care Medicine

## 2018-11-06 ENCOUNTER — Telehealth: Payer: Self-pay

## 2018-11-06 ENCOUNTER — Encounter (HOSPITAL_COMMUNITY): Payer: Self-pay | Admitting: Certified Registered Nurse Anesthetist

## 2018-11-06 ENCOUNTER — Encounter (HOSPITAL_COMMUNITY)
Admission: EM | Disposition: A | Discharge status: Skilled Nursing Facility | Payer: Self-pay | Source: Home / Self Care | Attending: Internal Medicine

## 2018-11-06 DIAGNOSIS — F0280 Dementia in other diseases classified elsewhere without behavioral disturbance: Secondary | ICD-10-CM

## 2018-11-06 DIAGNOSIS — I739 Peripheral vascular disease, unspecified: Secondary | ICD-10-CM

## 2018-11-06 DIAGNOSIS — L03116 Cellulitis of left lower limb: Secondary | ICD-10-CM

## 2018-11-06 DIAGNOSIS — G309 Alzheimer's disease, unspecified: Secondary | ICD-10-CM

## 2018-11-06 DIAGNOSIS — K5909 Other constipation: Secondary | ICD-10-CM

## 2018-11-06 DIAGNOSIS — R7881 Bacteremia: Secondary | ICD-10-CM

## 2018-11-06 DIAGNOSIS — D631 Anemia in chronic kidney disease: Secondary | ICD-10-CM

## 2018-11-06 DIAGNOSIS — L97923 Non-pressure chronic ulcer of unspecified part of left lower leg with necrosis of muscle: Secondary | ICD-10-CM

## 2018-11-06 DIAGNOSIS — R300 Dysuria: Secondary | ICD-10-CM

## 2018-11-06 DIAGNOSIS — K1329 Other disturbances of oral epithelium, including tongue: Secondary | ICD-10-CM

## 2018-11-06 DIAGNOSIS — N179 Acute kidney failure, unspecified: Secondary | ICD-10-CM

## 2018-11-06 DIAGNOSIS — Z87891 Personal history of nicotine dependence: Secondary | ICD-10-CM

## 2018-11-06 DIAGNOSIS — I70263 Atherosclerosis of native arteries of extremities with gangrene, bilateral legs: Secondary | ICD-10-CM

## 2018-11-06 DIAGNOSIS — E876 Hypokalemia: Secondary | ICD-10-CM

## 2018-11-06 DIAGNOSIS — N184 Chronic kidney disease, stage 4 (severe): Secondary | ICD-10-CM

## 2018-11-06 DIAGNOSIS — I130 Hypertensive heart and chronic kidney disease with heart failure and stage 1 through stage 4 chronic kidney disease, or unspecified chronic kidney disease: Secondary | ICD-10-CM

## 2018-11-06 DIAGNOSIS — I2510 Atherosclerotic heart disease of native coronary artery without angina pectoris: Secondary | ICD-10-CM

## 2018-11-06 DIAGNOSIS — Z79899 Other long term (current) drug therapy: Secondary | ICD-10-CM

## 2018-11-06 DIAGNOSIS — I5030 Unspecified diastolic (congestive) heart failure: Secondary | ICD-10-CM

## 2018-11-06 HISTORY — PX: AMPUTATION: SHX166

## 2018-11-06 LAB — BLOOD CULTURE ID PANEL (REFLEXED)
Acinetobacter baumannii: NOT DETECTED
CANDIDA GLABRATA: NOT DETECTED
Candida albicans: NOT DETECTED
Candida krusei: NOT DETECTED
Candida parapsilosis: NOT DETECTED
Candida tropicalis: NOT DETECTED
ENTEROBACTER CLOACAE COMPLEX: NOT DETECTED
Enterobacteriaceae species: NOT DETECTED
Enterococcus species: NOT DETECTED
Escherichia coli: NOT DETECTED
Haemophilus influenzae: NOT DETECTED
Klebsiella oxytoca: NOT DETECTED
Klebsiella pneumoniae: NOT DETECTED
Listeria monocytogenes: NOT DETECTED
Methicillin resistance: NOT DETECTED
Neisseria meningitidis: NOT DETECTED
Proteus species: NOT DETECTED
Pseudomonas aeruginosa: NOT DETECTED
STREPTOCOCCUS PNEUMONIAE: NOT DETECTED
Serratia marcescens: NOT DETECTED
Staphylococcus aureus (BCID): DETECTED — AB
Staphylococcus species: DETECTED — AB
Streptococcus agalactiae: NOT DETECTED
Streptococcus pyogenes: NOT DETECTED
Streptococcus species: NOT DETECTED

## 2018-11-06 LAB — COMPREHENSIVE METABOLIC PANEL
ALT: 52 U/L — ABNORMAL HIGH (ref 0–44)
ANION GAP: 9 (ref 5–15)
AST: 112 U/L — ABNORMAL HIGH (ref 15–41)
Albumin: 1.5 g/dL — ABNORMAL LOW (ref 3.5–5.0)
Alkaline Phosphatase: 101 U/L (ref 38–126)
BUN: 43 mg/dL — ABNORMAL HIGH (ref 8–23)
CALCIUM: 7.4 mg/dL — AB (ref 8.9–10.3)
CO2: 30 mmol/L (ref 22–32)
Chloride: 97 mmol/L — ABNORMAL LOW (ref 98–111)
Creatinine, Ser: 1.73 mg/dL — ABNORMAL HIGH (ref 0.44–1.00)
GFR calc Af Amer: 29 mL/min — ABNORMAL LOW (ref >60)
GFR calc non Af Amer: 25 mL/min — ABNORMAL LOW (ref >60)
Glucose, Bld: 96 mg/dL (ref 70–99)
Potassium: 3.3 mmol/L — ABNORMAL LOW (ref 3.5–5.1)
Sodium: 136 mmol/L (ref 135–145)
Total Bilirubin: 1.1 mg/dL (ref 0.3–1.2)
Total Protein: 7.2 g/dL (ref 6.5–8.1)

## 2018-11-06 LAB — CBC
HCT: 21.3 % — ABNORMAL LOW (ref 36.0–46.0)
HCT: 24.5 % — ABNORMAL LOW (ref 36.0–46.0)
Hemoglobin: 6.7 g/dL — CL (ref 12.0–15.0)
Hemoglobin: 8.1 g/dL — ABNORMAL LOW (ref 12.0–15.0)
MCH: 23.8 pg — ABNORMAL LOW (ref 26.0–34.0)
MCH: 25.5 pg — AB (ref 26.0–34.0)
MCHC: 31.5 g/dL (ref 30.0–36.0)
MCHC: 33.1 g/dL (ref 30.0–36.0)
MCV: 75.5 fL — ABNORMAL LOW (ref 80.0–100.0)
MCV: 77 fL — ABNORMAL LOW (ref 80.0–100.0)
NRBC: 0 % (ref 0.0–0.2)
PLATELETS: 379 10*3/uL (ref 150–400)
Platelets: 338 10*3/uL (ref 150–400)
RBC: 2.82 MIL/uL — AB (ref 3.87–5.11)
RBC: 3.18 MIL/uL — ABNORMAL LOW (ref 3.87–5.11)
RDW: 16.6 % — ABNORMAL HIGH (ref 11.5–15.5)
RDW: 17.7 % — ABNORMAL HIGH (ref 11.5–15.5)
WBC: 19.3 10*3/uL — ABNORMAL HIGH (ref 4.0–10.5)
WBC: 17.3 10*3/uL — ABNORMAL HIGH (ref 4.0–10.5)
nRBC: 0 % (ref 0.0–0.2)

## 2018-11-06 LAB — PREPARE RBC (CROSSMATCH)

## 2018-11-06 LAB — MAGNESIUM: Magnesium: 2 mg/dL (ref 1.7–2.4)

## 2018-11-06 SURGERY — AMPUTATION, ABOVE KNEE
Anesthesia: General | Laterality: Left

## 2018-11-06 MED ORDER — LEVOTHYROXINE SODIUM 88 MCG PO TABS
88.0000 ug | ORAL_TABLET | Freq: Every day | ORAL | Status: DC
  Administered 2018-11-08 – 2018-11-10 (×3): 88 ug via ORAL
  Filled 2018-11-06 (×3): qty 1

## 2018-11-06 MED ORDER — LEVOTHYROXINE SODIUM 100 MCG IV SOLR
44.0000 ug | Freq: Once | INTRAVENOUS | Status: AC
  Administered 2018-11-07: 44 ug via INTRAVENOUS
  Filled 2018-11-06: qty 5

## 2018-11-06 MED ORDER — PRO-STAT SUGAR FREE PO LIQD
30.0000 mL | Freq: Two times a day (BID) | ORAL | Status: DC
  Administered 2018-11-07 – 2018-11-10 (×5): 30 mL via ORAL
  Filled 2018-11-06 (×5): qty 30

## 2018-11-06 MED ORDER — FENTANYL CITRATE (PF) 100 MCG/2ML IJ SOLN
25.0000 ug | Freq: Once | INTRAMUSCULAR | Status: AC
  Administered 2018-11-06: 25 ug via INTRAVENOUS

## 2018-11-06 MED ORDER — LIDOCAINE HCL (CARDIAC) PF 100 MG/5ML IV SOSY
PREFILLED_SYRINGE | INTRAVENOUS | Status: DC | PRN
  Administered 2018-11-06: 30 mg via INTRAVENOUS

## 2018-11-06 MED ORDER — POTASSIUM CHLORIDE CRYS ER 20 MEQ PO TBCR: 40.0000 meq | EXTENDED_RELEASE_TABLET | Freq: Once | ORAL | Status: DC

## 2018-11-06 MED ORDER — ASPIRIN EC 81 MG PO TBEC
81.0000 mg | DELAYED_RELEASE_TABLET | Freq: Every day | ORAL | Status: DC
  Administered 2018-11-08 – 2018-11-10 (×3): 81 mg via ORAL
  Filled 2018-11-06 (×3): qty 1

## 2018-11-06 MED ORDER — FUROSEMIDE 10 MG/ML IJ SOLN
20.0000 mg | Freq: Every day | INTRAMUSCULAR | Status: DC
  Administered 2018-11-06: 20 mg via INTRAVENOUS
  Filled 2018-11-06: qty 2

## 2018-11-06 MED ORDER — ACETAMINOPHEN 325 MG PO TABS: 650.0000 mg | ORAL_TABLET | Freq: Four times a day (QID) | ORAL | Status: DC | PRN

## 2018-11-06 MED ORDER — PANTOPRAZOLE SODIUM 20 MG PO TBEC
20.0000 mg | DELAYED_RELEASE_TABLET | Freq: Every day | ORAL | Status: DC
  Administered 2018-11-08 – 2018-11-10 (×3): 20 mg via ORAL
  Filled 2018-11-06 (×3): qty 1

## 2018-11-06 MED ORDER — PROPOFOL 500 MG/50ML IV EMUL
INTRAVENOUS | Status: DC | PRN
  Administered 2018-11-06: 50 ug/kg/min via INTRAVENOUS

## 2018-11-06 MED ORDER — FUROSEMIDE 40 MG PO TABS: 40.0000 mg | ORAL_TABLET | Freq: Every day | ORAL | Status: DC

## 2018-11-06 MED ORDER — SODIUM CHLORIDE 0.9% IV SOLUTION
Freq: Once | INTRAVENOUS | Status: AC
  Administered 2018-11-06: 09:00:00 via INTRAVENOUS

## 2018-11-06 MED ORDER — DARBEPOETIN ALFA 60 MCG/0.3ML IJ SOSY
60.0000 ug | PREFILLED_SYRINGE | INTRAMUSCULAR | Status: DC
  Administered 2018-11-07: 60 ug via SUBCUTANEOUS
  Filled 2018-11-06: qty 0.3

## 2018-11-06 MED ORDER — TRAMADOL HCL 50 MG PO TABS: 50.0000 mg | ORAL_TABLET | Freq: Two times a day (BID) | ORAL | Status: DC | PRN

## 2018-11-06 MED ORDER — LEVOTHYROXINE SODIUM 100 MCG IV SOLR: 88.0000 ug | Freq: Once | INTRAVENOUS | Status: DC

## 2018-11-06 MED ORDER — PROPOFOL 10 MG/ML IV BOLUS
INTRAVENOUS | Status: DC | PRN
  Administered 2018-11-06: 50 mg via INTRAVENOUS

## 2018-11-06 MED ORDER — FENTANYL CITRATE (PF) 250 MCG/5ML IJ SOLN
INTRAMUSCULAR | Status: AC
  Filled 2018-11-06: qty 5

## 2018-11-06 MED ORDER — BISACODYL 10 MG RE SUPP: 10.0000 mg | Freq: Every day | RECTAL | Status: DC | PRN

## 2018-11-06 MED ORDER — PHENYLEPHRINE HCL 10 MG/ML IJ SOLN
INTRAMUSCULAR | Status: DC | PRN
  Administered 2018-11-06: 40 ug via INTRAVENOUS
  Administered 2018-11-06 (×2): 80 ug via INTRAVENOUS
  Administered 2018-11-06: 40 ug via INTRAVENOUS
  Administered 2018-11-06: 80 ug via INTRAVENOUS

## 2018-11-06 MED ORDER — POTASSIUM CHLORIDE 10 MEQ/100ML IV SOLN
10.0000 meq | INTRAVENOUS | Status: AC
  Administered 2018-11-06 (×2): 10 meq via INTRAVENOUS
  Filled 2018-11-06 (×2): qty 100

## 2018-11-06 MED ORDER — SENNOSIDES-DOCUSATE SODIUM 8.6-50 MG PO TABS
2.0000 | ORAL_TABLET | Freq: Two times a day (BID) | ORAL | Status: DC
  Administered 2018-11-07 – 2018-11-10 (×6): 2 via ORAL
  Filled 2018-11-06 (×6): qty 2

## 2018-11-06 MED ORDER — GERHARDT'S BUTT CREAM
TOPICAL_CREAM | Freq: Two times a day (BID) | CUTANEOUS | Status: DC
  Administered 2018-11-07 – 2018-11-10 (×6): via TOPICAL
  Filled 2018-11-06 (×2): qty 1

## 2018-11-06 MED ORDER — ONDANSETRON HCL 4 MG/2ML IJ SOLN: 4.0000 mg | Freq: Once | INTRAMUSCULAR | Status: DC | PRN

## 2018-11-06 MED ORDER — CEFAZOLIN SODIUM-DEXTROSE 2-4 GM/100ML-% IV SOLN
2.0000 g | Freq: Two times a day (BID) | INTRAVENOUS | Status: DC
  Administered 2018-11-07 – 2018-11-10 (×7): 2 g via INTRAVENOUS
  Filled 2018-11-06 (×9): qty 100

## 2018-11-06 MED ORDER — ONDANSETRON HCL 4 MG PO TABS: 4.0000 mg | ORAL_TABLET | Freq: Three times a day (TID) | ORAL | Status: DC | PRN

## 2018-11-06 MED ORDER — BUPIVACAINE-EPINEPHRINE (PF) 0.5% -1:200000 IJ SOLN
INTRAMUSCULAR | Status: DC | PRN
  Administered 2018-11-06: 20 mL via PERINEURAL

## 2018-11-06 MED ORDER — ONDANSETRON HCL 4 MG/2ML IJ SOLN
INTRAMUSCULAR | Status: AC
  Filled 2018-11-06: qty 2

## 2018-11-06 MED ORDER — PHENYLEPHRINE 40 MCG/ML (10ML) SYRINGE FOR IV PUSH (FOR BLOOD PRESSURE SUPPORT)
PREFILLED_SYRINGE | INTRAVENOUS | Status: AC
  Filled 2018-11-06: qty 10

## 2018-11-06 MED ORDER — LACTATED RINGERS IV SOLN
INTRAVENOUS | Status: DC | PRN
  Administered 2018-11-06: 12:00:00 via INTRAVENOUS

## 2018-11-06 MED ORDER — FENTANYL CITRATE (PF) 100 MCG/2ML IJ SOLN: 25.0000 ug | INTRAMUSCULAR | Status: DC | PRN

## 2018-11-06 MED ORDER — LIDOCAINE 2% (20 MG/ML) 5 ML SYRINGE
INTRAMUSCULAR | Status: AC
  Filled 2018-11-06: qty 5

## 2018-11-06 MED ORDER — PRAVASTATIN SODIUM 40 MG PO TABS
40.0000 mg | ORAL_TABLET | Freq: Every day | ORAL | Status: DC
  Administered 2018-11-07 – 2018-11-09 (×3): 40 mg via ORAL
  Filled 2018-11-06 (×3): qty 1

## 2018-11-06 MED ORDER — HYDROMORPHONE HCL 1 MG/ML IJ SOLN
0.5000 mg | INTRAMUSCULAR | Status: DC | PRN
  Administered 2018-11-07: 0.5 mg via INTRAVENOUS
  Filled 2018-11-06: qty 1

## 2018-11-06 MED ORDER — CALCIUM CITRATE 950 (200 CA) MG PO TABS
200.0000 mg | ORAL_TABLET | Freq: Two times a day (BID) | ORAL | Status: DC
  Administered 2018-11-07 – 2018-11-10 (×5): 200 mg via ORAL
  Filled 2018-11-06 (×9): qty 1

## 2018-11-06 MED ORDER — FENTANYL CITRATE (PF) 100 MCG/2ML IJ SOLN
INTRAMUSCULAR | Status: AC
  Filled 2018-11-06: qty 2

## 2018-11-06 MED ORDER — 0.9 % SODIUM CHLORIDE (POUR BTL) OPTIME
TOPICAL | Status: DC | PRN
  Administered 2018-11-06: 1000 mL

## 2018-11-06 MED ORDER — FENTANYL CITRATE (PF) 100 MCG/2ML IJ SOLN
INTRAMUSCULAR | Status: DC | PRN
  Administered 2018-11-06: 50 ug via INTRAVENOUS
  Administered 2018-11-06: 25 ug via INTRAVENOUS

## 2018-11-06 MED ORDER — PROPOFOL 1000 MG/100ML IV EMUL
INTRAVENOUS | Status: AC
  Filled 2018-11-06: qty 100

## 2018-11-06 MED ORDER — FLEET ENEMA 7-19 GM/118ML RE ENEM: 1.0000 | ENEMA | Freq: Every day | RECTAL | Status: DC | PRN

## 2018-11-06 SURGICAL SUPPLY — 51 items
BANDAGE ACE 4X5 VEL STRL LF (GAUZE/BANDAGES/DRESSINGS) ×2 IMPLANT
BANDAGE ACE 6X5 VEL STRL LF (GAUZE/BANDAGES/DRESSINGS) ×2 IMPLANT
BANDAGE ELASTIC 4 VELCRO ST LF (GAUZE/BANDAGES/DRESSINGS) ×2 IMPLANT
BANDAGE ELASTIC 6 VELCRO ST LF (GAUZE/BANDAGES/DRESSINGS) ×2 IMPLANT
BANDAGE ESMARK 6X9 LF (GAUZE/BANDAGES/DRESSINGS) ×1 IMPLANT
BLADE SAGITTAL (BLADE)
BLADE SAGITTAL 25.0X1.19X90 (BLADE) IMPLANT
BLADE SAW GIGLI 510 (BLADE) ×2 IMPLANT
BLADE SAW THK.89X75X18XSGTL (BLADE) IMPLANT
BNDG COHESIVE 6X5 TAN STRL LF (GAUZE/BANDAGES/DRESSINGS) ×2 IMPLANT
BNDG ESMARK 6X9 LF (GAUZE/BANDAGES/DRESSINGS) ×2
BNDG GAUZE ELAST 4 BULKY (GAUZE/BANDAGES/DRESSINGS) ×4 IMPLANT
BRUSH SCRUB EZ PLAIN DRY (MISCELLANEOUS) ×4 IMPLANT
CANISTER SUCT 3000ML PPV (MISCELLANEOUS) ×2 IMPLANT
CLIP VESOCCLUDE MED 6/CT (CLIP) ×4 IMPLANT
COVER SURGICAL LIGHT HANDLE (MISCELLANEOUS) ×2 IMPLANT
COVER WAND RF STERILE (DRAPES) ×2 IMPLANT
CUFF TOURNIQUET SINGLE 24IN (TOURNIQUET CUFF) IMPLANT
CUFF TOURNIQUET SINGLE 34IN LL (TOURNIQUET CUFF) IMPLANT
DRAIN CHANNEL 19F RND (DRAIN) IMPLANT
DRAPE HALF SHEET 40X57 (DRAPES) ×2 IMPLANT
DRAPE ORTHO SPLIT 77X108 STRL (DRAPES) ×2
DRAPE SURG ORHT 6 SPLT 77X108 (DRAPES) ×2 IMPLANT
DRSG ADAPTIC 3X8 NADH LF (GAUZE/BANDAGES/DRESSINGS) ×2 IMPLANT
ELECT CAUTERY BLADE 6.4 (BLADE) ×2 IMPLANT
ELECT REM PT RETURN 9FT ADLT (ELECTROSURGICAL) ×2
ELECTRODE REM PT RTRN 9FT ADLT (ELECTROSURGICAL) ×1 IMPLANT
EVACUATOR SILICONE 100CC (DRAIN) IMPLANT
GAUZE SPONGE 4X4 12PLY STRL (GAUZE/BANDAGES/DRESSINGS) ×2 IMPLANT
GLOVE BIO SURGEON STRL SZ7.5 (GLOVE) ×2 IMPLANT
GOWN STRL REUS W/ TWL LRG LVL3 (GOWN DISPOSABLE) ×2 IMPLANT
GOWN STRL REUS W/ TWL XL LVL3 (GOWN DISPOSABLE) ×1 IMPLANT
GOWN STRL REUS W/TWL LRG LVL3 (GOWN DISPOSABLE) ×2
GOWN STRL REUS W/TWL XL LVL3 (GOWN DISPOSABLE) ×1
KIT BASIN OR (CUSTOM PROCEDURE TRAY) ×2 IMPLANT
KIT TURNOVER KIT B (KITS) ×2 IMPLANT
NS IRRIG 1000ML POUR BTL (IV SOLUTION) ×2 IMPLANT
PACK GENERAL/GYN (CUSTOM PROCEDURE TRAY) ×2 IMPLANT
PAD ARMBOARD 7.5X6 YLW CONV (MISCELLANEOUS) ×4 IMPLANT
STAPLER VISISTAT 35W (STAPLE) ×4 IMPLANT
STOCKINETTE IMPERVIOUS LG (DRAPES) ×2 IMPLANT
SUT ETHILON 3 0 PS 1 (SUTURE) IMPLANT
SUT SILK 0 TIES 10X30 (SUTURE) ×2 IMPLANT
SUT SILK 2 0 (SUTURE) ×1
SUT SILK 2-0 18XBRD TIE 12 (SUTURE) ×1 IMPLANT
SUT SILK 3 0 (SUTURE)
SUT SILK 3-0 18XBRD TIE 12 (SUTURE) IMPLANT
SUT VIC AB 2-0 CT1 18 (SUTURE) ×6 IMPLANT
TOWEL GREEN STERILE (TOWEL DISPOSABLE) ×4 IMPLANT
UNDERPAD 30X30 (UNDERPADS AND DIAPERS) ×2 IMPLANT
WATER STERILE IRR 1000ML POUR (IV SOLUTION) ×2 IMPLANT

## 2018-11-06 NOTE — ED Notes (Signed)
Attempted to contact pt daughter for verbal consent for blood. No answer. Will attempt later in morning.

## 2018-11-06 NOTE — ED Notes (Signed)
Report to RN in short stay.  They will consent pt

## 2018-11-06 NOTE — Progress Notes (Signed)
  Dr Donzetta Matters notified of CBC results. Post op Hgb 8.1. Pt asymptomatic. No new orders given at this time.

## 2018-11-06 NOTE — Progress Notes (Signed)
PHARMACY - PHYSICIAN COMMUNICATION CRITICAL VALUE ALERT - BLOOD CULTURE IDENTIFICATION (BCID)  [name] is an 84 y.o. female who presented to [facility] on 11/05/2018 with a chief complaint of cellulitis and wounds  Assessment:  2/2 MSSA in blood  Name of physician (or Provider) Contacted: Dr Hetty Ely  Current antibiotics: Cefepime Vanc  Changes to prescribed antibiotics recommended:  Continue cefepime Dc vanc Auto ID consult  Results for orders placed or performed during the hospital encounter of 11/05/18  Blood Culture ID Panel (Reflexed) (Collected: 11/05/2018  7:30 PM)  Result Value Ref Range   Enterococcus species NOT DETECTED NOT DETECTED   Listeria monocytogenes NOT DETECTED NOT DETECTED   Staphylococcus species DETECTED (A) NOT DETECTED   Staphylococcus aureus (BCID) DETECTED (A) NOT DETECTED   Methicillin resistance NOT DETECTED NOT DETECTED   Streptococcus species NOT DETECTED NOT DETECTED   Streptococcus agalactiae NOT DETECTED NOT DETECTED   Streptococcus pneumoniae NOT DETECTED NOT DETECTED   Streptococcus pyogenes NOT DETECTED NOT DETECTED   Acinetobacter baumannii NOT DETECTED NOT DETECTED   Enterobacteriaceae species NOT DETECTED NOT DETECTED   Enterobacter cloacae complex NOT DETECTED NOT DETECTED   Escherichia coli NOT DETECTED NOT DETECTED   Klebsiella oxytoca NOT DETECTED NOT DETECTED   Klebsiella pneumoniae NOT DETECTED NOT DETECTED   Proteus species NOT DETECTED NOT DETECTED   Serratia marcescens NOT DETECTED NOT DETECTED   Haemophilus influenzae NOT DETECTED NOT DETECTED   Neisseria meningitidis NOT DETECTED NOT DETECTED   Pseudomonas aeruginosa NOT DETECTED NOT DETECTED   Candida albicans NOT DETECTED NOT DETECTED   Candida glabrata NOT DETECTED NOT DETECTED   Candida krusei NOT DETECTED NOT DETECTED   Candida parapsilosis NOT DETECTED NOT DETECTED   Candida tropicalis NOT DETECTED NOT DETECTED   Levester Fresh, PharmD, BCPS, BCCCP Clinical  Pharmacist [phone]  Please check AMION for all [place] numbers  11/06/2018 8:13 AM

## 2018-11-06 NOTE — ED Notes (Signed)
Brief checked and is dry. Pt remains asleep at this time, resting comfortably.

## 2018-11-06 NOTE — ED Notes (Signed)
Called main lab to add on Mg

## 2018-11-06 NOTE — Telephone Encounter (Signed)
Spoke with patient's daughter Juliene Pina). She stated pt. Is in ED since last night and going to be admitted. I called Sonya ([facility]) to inform her.

## 2018-11-06 NOTE — Progress Notes (Signed)
Patient NPO for aspiration event awaiting SLP evaluation. Notified on-call physician for need to, convert where possible, PO meds to IV.  Will continue to monitor and await orders.

## 2018-11-06 NOTE — Progress Notes (Signed)
   Subjective: No acute complaints. Denies sob, chest pain, or other pain at rest. Left leg hurts with movement. Endorsing dysuria and increased frequency.   Objective:  Vital signs in last 24 hours: Vitals:   11/06/18 1021 11/06/18 1030 11/06/18 1145 11/06/18 1150  BP:  119/77 (!) 152/96   Pulse:  83    Resp:   14 16  Temp: 99.4 F (37.4 C)     TempSrc: Oral     SpO2:  100%     Gen: laying comfortably in bed, NAD HENT: white plaque on tongue, unable to visualize oropharynx due to patient's poor effort.  Cardiac: irregularly irregular, no murmurs/rubs/gallops Pulm: CTAB Ext: right AKA. Left foot is cool with foul odor and gangrenous appearing middle toe and large eschar on heel. Thready DP pulse present. Foot is wrapped with gauze, no gross discharge seen through the gauze. Pain endorsed with minimal movement of the left leg/foot.   Micro:  BCx 12/2: MSSA UCx 12/2: pending   Antibiotics: Vanco 12/2 one dose Cefepime 12/2 one dose Cefazolin (12/3 - present)  Assessment/Plan:  Principal Problem:   Bacteremia due to methicillin susceptible Staphylococcus aureus (MSSA) Active Problems:   Microcytic anemia   AKI (acute kidney injury) ([place])   CKD (chronic kidney disease), stage IV (HCC)   Cellulitis  84 year old female with Alzheimer's dementia, CAD, A. fib/flutter, hypertension, CKD 4, PVD status post right AKA who presented with 2 days of fever worsening and left leg pain in the setting of nonhealing wounds and leukocytosis.  X-rays negative for osteomyelitis  1.  Bacteremia 2/2 left leg cellulitis and nonhealing wounds: Vascular has been consulted.  They are planning for left AKA today.  Blood culture results are growing staph aureus, methicillin susceptible.  Will de-escalate to cefazolin.  Pain is well controlled on home Percocet. - Last fever 12/2 @ 7pm 101.48F - Discontinue vancomycin and cefepime - Cefazolin per pharmacy - OR today for left AKA  2.  AKI on CKD:  Likely prerenal, continue IV fluids.  Holding home Lasix.  Continue to monitor  3. Anemia of chronic disease: Hemoglobin dropped to 6.7. No source of active bleeding. - f/u FOBT - transfusing 1 unit PRBC - f/u post transfusion H&H - continue to monitor after surgery  - continue aranesp, next dose due 12/4?   4. Dysuria: Endorsing dysuria with increased frequency. UA does not look infected. - f/u UCx  4. Hypokalemia: 3.3 this morning. Repleting IV 2/2 difficulty swallowing.   Afib/flutter: continue home amiodarone  HFpEF: hypovolemic. Holding lasix  Constipation: chronic. Continue scheduled miralax and senokot    Dispo: Anticipated discharge in approximately 2-3 day(s).   Isabelle Course, MD 11/06/2018, 11:57 AM Pager: [phone]

## 2018-11-06 NOTE — Anesthesia Procedure Notes (Signed)
Anesthesia Regional Block: Popliteal block (Sciatic block (mid-thigh))   Pre-Anesthetic Checklist: ,, timeout performed, Correct Patient, Correct Site, Correct Laterality, Correct Procedure, Correct Position, site marked, Risks and benefits discussed,  Surgical consent,  Pre-op evaluation,  At surgeon's request and post-op pain management  Laterality: Left  Prep: chloraprep       Needles:  Injection technique: Single-shot  Needle Type: Echogenic Needle     Needle Length: 10cm  Needle Gauge: 21     Additional Needles:   Narrative:  Start time: 11/06/2018 11:45 AM End time: 11/06/2018 11:51 AM Injection made incrementally with aspirations every 5 mL.  Performed by: Personally  Anesthesiologist: [name], MD  Additional Notes: No pain on injection. No increased resistance to injection. Injection made in 5cc increments. Good needle visualization. Patient tolerated the procedure well.

## 2018-11-06 NOTE — Transfer of Care (Signed)
Immediate Anesthesia Transfer of Care Note  Patient: [name]  Procedure(s) Performed: AMPUTATION ABOVE KNEE (Left )  Patient Location: PACU  Anesthesia Type:GA combined with regional for post-op pain  Level of Consciousness: awake and alert   Airway & Oxygen Therapy: Patient Spontanous Breathing and Patient connected to nasal cannula oxygen  Post-op Assessment: Report given to RN and Post -op Vital signs reviewed and stable  Post vital signs: Reviewed and stable  Last Vitals:  Vitals Value Taken Time  BP 106/68 11/06/2018  1:15 PM  Temp 36.8 C 11/06/2018  1:15 PM  Pulse 81 11/06/2018  1:17 PM  Resp 12 11/06/2018  1:17 PM  SpO2 100 % 11/06/2018  1:17 PM  Vitals shown include unvalidated device data.  Last Pain:  Vitals:   11/06/18 1315  TempSrc:   PainSc: (P) 0-No pain         Complications: No apparent anesthesia complications

## 2018-11-06 NOTE — Consult Note (Addendum)
[place] Nurse wound consult note Reason for Consult:Vascular nonhealing wounds to left leg and foot.  Follwed by vascular and was scheduled for Left AKA on 11/12/18.  Will defer to vascular services for the ongoing care of this nonviable extremity. She will proceed with Left AKA today.  Left gluteal fold near apex of gluteal cleft with stage 3 nonintact pressure injury.  Patient is in disposable brief.  I explained to patient and daughter at bedside that once admitted, she would likely not be in disposable briefs.  Explained the function of our therapeutic linen and the antimicrobial and moisture wicking properties that would promote healing and improve the skin microclimate.  They are both in agreement.  Wound type:Pressure and nonhealing vascular.  Pressure Injury POA: Yes Measurement:Stage 3 left gluteal fold:  1 cmx 1 cmx 0.2 cm and 2 cm x 3 cm x 0.2 cm scarring to periwound indicating healed full thickness skin loss, extends 2 cm circumferentially.  Wound bed: pink and moist Drainage (amount, consistency, odor) minimal serosanguinous  Periwound: scarring Dressing procedure/placement/frequency: Cleanse buttocks wound with NS and pat dry.  Apply Gerhardts butt paste twice daily.  Turn and reposition every two hours. No disposable briefs while in bed.  Dry dressing to LLE wounds, vascular to cover and any orders they recommend would supercede these orders. Will not follow at this time.  Please re-consult if needed.  Domenic Moras MSN, RN, FNP-BC CWON Wound, Ostomy, Continence Nurse Pager [phone]

## 2018-11-06 NOTE — Progress Notes (Signed)
SLP ordered for pt after aspiration event this am per ED note. SLP has not been by to see pt as of yet, placed call to internal med as pt has not had any PO med today. Notified resident. Awaiting new orders.  Amanda Cockayne, RN

## 2018-11-06 NOTE — Anesthesia Preprocedure Evaluation (Addendum)
Anesthesia Evaluation  Patient identified by MRN, date of birth, ID band Patient awake    Reviewed: Allergy & Precautions, NPO status , Patient's Chart, lab work & pertinent test results  History of Anesthesia Complications Negative for: history of anesthetic complications  Airway Mallampati: II  TM Distance: >3 FB Neck ROM: Full    Dental  (+) Dental Advisory Given, Partial Upper, Lower Dentures   Pulmonary neg pulmonary ROS,    breath sounds clear to auscultation       Cardiovascular hypertension, + CAD, + Peripheral Vascular Disease and + DVT  + dysrhythmias Atrial Fibrillation  Rhythm:Irregular Rate:Normal     Neuro/Psych PSYCHIATRIC DISORDERS Dementia CVA, No Residual Symptoms    GI/Hepatic Neg liver ROS, GERD  Medicated, Hx mesenteric ischemia   Endo/Other  Hypothyroidism  Hypokalemia Hypocalcemia  Renal/GU CRFRenal disease     Musculoskeletal  (+) Arthritis ,   Abdominal   Peds  Hematology  (+) anemia ,  Leukosytosis    Anesthesia Other Findings   Reproductive/Obstetrics                            Anesthesia Physical Anesthesia Plan  ASA: IV  Anesthesia Plan: General   Post-op Pain Management:  Regional for Post-op pain   Induction: Intravenous  PONV Risk Score and Plan: 4 or greater and Treatment may vary due to age or medical condition, Ondansetron and Propofol infusion  Airway Management Planned: LMA  Additional Equipment: None  Intra-op Plan:   Post-operative Plan: Extubation in OR  Informed Consent: I have reviewed the patients History and Physical, chart, labs and discussed the procedure including the risks, benefits and alternatives for the proposed anesthesia with the patient or authorized representative who has indicated his/her understanding and acceptance.   Dental advisory given and Consent reviewed with POA  Plan Discussed with: CRNA and  Anesthesiologist  Anesthesia Plan Comments: (Discussed partial DNR wishes with patient and family at bedside. Patient agreeable to invasive airway management as indicated by anesthesia.)      Anesthesia Quick Evaluation

## 2018-11-06 NOTE — Progress Notes (Signed)
Pharmacy Antibiotic Note  [name] is a 84 y.o. female admitted on 11/05/2018 with cellulitis found to have MSSA bacteremia.  Pharmacy has been consulted for cefazolin dosing. She is s/p left AKA today. She has known CKD IV. Last cefepime dose was 12/2 at 23:09.  Plan: Cefazolin 2 g IV q12h Monitor renal function, clinical progress, and LOT F/U TTE results F/U repeat BCx    Temp (24hrs), Avg:99.3 F (37.4 C), Min:98 F (36.7 C), Max:101.9 F (38.8 C)  Recent Labs  Lab 11/05/18 1859 11/05/18 1914 11/05/18 2308 11/06/18 0227 11/06/18 1341  WBC 17.7*  --   --  19.3* 17.3*  CREATININE 1.79*  --   --  1.73*  --   LATICACIDVEN  --  1.42 1.96*  --   --     Estimated Creatinine Clearance: 18.3 mL/min (A) (by C-G formula based on SCr of 1.73 mg/dL (H)).    Allergies  Allergen Reactions  . Morphine And Related Anaphylaxis  . Penicillins Other (See Comments) and Nausea And Vomiting    Makes patient sick Has patient had a PCN reaction causing immediate rash, facial/tongue/throat swelling, SOB or lightheadedness with hypotension: No Has patient had a PCN reaction causing severe rash involving mucus membranes or skin necrosis: No Has patient had a PCN reaction that required hospitalization: No Has patient had a PCN reaction occurring within the last 10 years: No If all of the above answers are "NO", then may proceed with Cephalosporin use.     Antimicrobials this admission: Vanc 12/2 >> 12/3 Cefepime 12/2 >> 12/3 Cefazolin 12/3 >>   Thank you for allowing pharmacy to be a part of this patient's care.  Renold Genta, PharmD, BCPS Clinical Pharmacist Clinical phone for 11/06/2018 until 3p is [phone] 11/06/2018 3:02 PM  **Pharmacist phone directory can now be found on [email or web address] listed under [place]**

## 2018-11-06 NOTE — Anesthesia Procedure Notes (Signed)
Procedure Name: LMA Insertion Date/Time: 11/06/2018 12:34 PM Performed by: Inda Coke, CRNA Pre-anesthesia Checklist: Patient identified, Emergency Drugs available, Suction available and Patient being monitored Patient Re-evaluated:Patient Re-evaluated prior to induction Oxygen Delivery Method: Circle System Utilized Preoxygenation: Pre-oxygenation with 100% oxygen Induction Type: IV induction Ventilation: Mask ventilation without difficulty LMA: LMA inserted LMA Size: 3.0 Number of attempts: 1 Placement Confirmation: positive ETCO2 Tube secured with: Tape Dental Injury: Teeth and Oropharynx as per pre-operative assessment

## 2018-11-06 NOTE — ED Notes (Signed)
Vascular here to see pt

## 2018-11-06 NOTE — Op Note (Signed)
    Patient name: [name] MRN: [id number] DOB: [birth year] Sex: female  11/06/2018 Pre-operative Diagnosis: critical left lower extremity ischemia Post-operative diagnosis:  Same Surgeon:  Erlene Quan C. Donzetta Matters, MD Assistant: Laurence Slate, PA Procedure Performed:  Left above knee amputation  Indications: 84 year old female with previous right above-knee amputation.  She now has nonhealing wounds of her left lower extremity.  She is nonambulatory has been indicated for left above-knee amputation.  Now that she has infection we will plan to proceed today.  Findings: There is some bleeding in the muscle which is all viable and appears to be suitable for healing.   Procedure:  The patient was identified in the holding area and taken to the operating room where she is placed supine operative when general anesthesia was induced.  She was sterilely prepped and draped in the left lower extremity usual fashion, antibiotics were already up-to-date, a timeout was called.  We began with a fishmouth type incision above the knee.  We dissected down through the skin and subcutaneous tissue and muscle to the level of the femur.  Periosteum was elevated.  Femur was transected with Gigli saw.  Posterior flap was created with amputation knife.  The vessels were clamped suture ligated.  The nerve was pulled on tension ligated with a Vicryl tie and trimmed.  Hemostasis obtained the wound was irrigated.  Bone rasp was used to smooth the bone.  Fascia was reapproximated with interrupted 2-0 Vicryl sutures.  Skin clips were placed to close the skin.  Sterile dressing was placed.  She was awakened from anesthesia having tolerated procedure without immediate comp occasion.  All counts were correct at completion.  EBL: 50cc   Brandon C. Donzetta Matters, MD [facility] Office: [phone] Pager: [phone]

## 2018-11-06 NOTE — ED Notes (Signed)
Verbal consent given by DTR-N-LAW for Pt to receive Blood transfusion

## 2018-11-06 NOTE — Consult Note (Addendum)
CONSULT NOTE   MRN : [id number]  Reason for Consult: Ischemic left LE with active infection Referring Physician: ED  History of Present Illness: [name] is a pleasant 84 y.o. female, who is referred with multiple nonhealing wounds of the left leg. The patient has a history of a right above-the-knee amputation.  This was done in October 2012.  She is nonambulatory.  She does not have a prosthesis.  She was last seen by Dr. Scot Dock 10/24/2018 and at that time the plan was for left AKA 11/12/2018 by Dr. Scot Dock.  Today at presentation to the ED she has positive blood cultures Methicillin (oxacillin) susceptible Staphylococcus aureus   with a Tm 99.    I have reviewed the records from the referring office.  The patient does have a history of Alzheimer's dementia.  In addition the patient has a history of coronary artery disease and is status post myocardial infarction.  She is had a previous stroke.  In addition she has congestive heart failure, hypertension, stage IV chronic kidney disease, history of atrial fibrillation.  She is not on anticoagulation medication currently.    She developed the wounds on her left leg  3 months ago.  There is no specific injury to the leg they started as a blister.  She does describe some pain in her left foot at night when she is sleeping.  She does have a history of coronary artery disease and a remote myocardial infarction but has had no recent cardiac symptoms.  She also has a remote history of a stroke.     Current Facility-Administered Medications  Medication Dose Route Frequency Provider Last Rate Last Dose  . 0.9 %  sodium chloride infusion   Intravenous Continuous Lorella Nimrod, MD 100 mL/hr at 11/06/18 2637    . acetaminophen (TYLENOL) tablet 650 mg  650 mg Oral Q6H PRN Lorella Nimrod, MD       Or  . acetaminophen (TYLENOL) suppository 650 mg  650 mg Rectal Q6H PRN Lorella Nimrod, MD      . acetaminophen (TYLENOL) tablet 650 mg  650 mg  Oral Once Lacretia Leigh, MD      . amiodarone (PACERONE) tablet 100 mg  100 mg Oral Daily Lorella Nimrod, MD      . ceFEPIme (MAXIPIME) 1 g in sodium chloride 0.9 % 100 mL IVPB  1 g Intravenous Q24H Lavenia Atlas, [place] at 11/05/18 2353  . enoxaparin (LOVENOX) injection 30 mg  30 mg Subcutaneous Q24H Amin, Soundra Pilon, MD      . feeding supplement (ENSURE ENLIVE) (ENSURE ENLIVE) liquid 237 mL  237 mL Oral Q1400 Lorella Nimrod, MD      . levothyroxine (SYNTHROID, LEVOTHROID) tablet 88 mcg  88 mcg Oral QAC breakfast Lorella Nimrod, MD   88 mcg at 11/06/18 0749  . ondansetron (ZOFRAN) tablet 4 mg  4 mg Oral Q6H PRN Lorella Nimrod, MD       Or  . ondansetron (ZOFRAN) injection 4 mg  4 mg Intravenous Q6H PRN Lorella Nimrod, MD      . oxyCODONE-acetaminophen (PERCOCET/ROXICET) 5-325 MG per tablet 1 tablet  1 tablet Oral Q6H PRN Lorella Nimrod, MD   1 tablet at 11/05/18 2240  . polyethylene glycol (MIRALAX / GLYCOLAX) packet 17 g  17 g Oral Daily Amin, Soundra Pilon, MD      . potassium chloride 10 mEq in 100 mL IVPB  10 mEq Intravenous Q1 Hr x 3 Isabelle Course, MD      .  senna (SENOKOT) tablet 8.6 mg  1 tablet Oral BID Lorella Nimrod, MD      . sodium chloride flush (NS) 0.9 % injection 3 mL  3 mL Intravenous Q12H Lorella Nimrod, MD       Current Outpatient Medications  Medication Sig Dispense Refill  . acetaminophen (TYLENOL) 325 MG tablet Take 650 mg by mouth every 6 (six) hours as needed (pain). Max dose 3000 mg/24 hours    . Amino Acids-Protein Hydrolys (FEEDING SUPPLEMENT, PRO-STAT SUGAR FREE 64,) LIQD Take 30 mLs by mouth 2 (two) times daily with a meal.    . amiodarone (PACERONE) 100 MG tablet Take 100 mg by mouth daily.    Marland Kitchen aspirin EC 81 MG EC tablet Take 1 tablet (81 mg total) by mouth daily.    . bisacodyl (DULCOLAX) 10 MG suppository Place 10 mg rectally daily as needed (constipation not relieved by MOM).    . calcium citrate (CALCITRATE - DOSED IN MG ELEMENTAL CALCIUM) 950 MG tablet Take  200 mg of elemental calcium by mouth 2 (two) times daily with a meal.     . Coenzyme Q10 100 MG capsule Take 200 mg by mouth daily.     . Darbepoetin Alfa (ARANESP) 60 MCG/0.3ML SOSY injection Inject 60 mcg into the skin every 14 (fourteen) days. Every other Wednesday    . feeding supplement, ENSURE ENLIVE, (ENSURE ENLIVE) LIQD Take 237 mLs by mouth daily at 2 PM.     . furosemide (LASIX) 40 MG tablet Take 40 mg by mouth daily.    Marland Kitchen levothyroxine (SYNTHROID, LEVOTHROID) 88 MCG tablet Take 88 mcg by mouth daily before breakfast.    . Magnesium Hydroxide (MILK OF MAGNESIA PO) Take 30 mLs by mouth daily as needed (constipation).    . ondansetron (ZOFRAN) 4 MG tablet Take 4 mg by mouth every 8 (eight) hours as needed for nausea.     Marland Kitchen oxyCODONE-acetaminophen (PERCOCET/ROXICET) 5-325 MG tablet Take by mouth every 6 (six) hours as needed (left leg pain).    . pantoprazole (PROTONIX) 20 MG tablet Take 20 mg by mouth daily before breakfast.     . pravastatin (PRAVACHOL) 40 MG tablet Take 40 mg by mouth at bedtime.     . sennosides-docusate sodium (SENOKOT-S) 8.6-50 MG tablet Take 2 tablets by mouth 2 (two) times daily.     . Sodium Phosphates (FLEET ENEMA RE) Place 1 enema rectally daily as needed (constipation not relieved by Bisacodyl suppository).    . traMADol (ULTRAM) 50 MG tablet Take 1 tablet (50 mg total) by mouth every 12 (twelve) hours as needed for severe pain. (Patient not taking: Reported on 11/05/2018) 5 tablet 0    Pt meds include: Statin :Yes Betablocker: No ASA: Yes Other anticoagulants/antiplatelets: none  Past Medical History:  Diagnosis Date  . Anemia   . Arthritis   . Atrial fibrillation ([place])   . Blood clot in vein   . Coronary artery disease   . DVT (deep venous thrombosis) ([place])   . GERD (gastroesophageal reflux disease)   . Hyperlipidemia   . Hypertension   . Mesenteric ischemia   . Stroke [facility])     Past Surgical History:  Procedure Laterality Date  . abdominal  aortogram    . ABDOMINAL HYSTERECTOMY  1964  . CHOLECYSTECTOMY    . RT AKA    . thrombectomy of superior mesenteric artery      Social History Social History   Tobacco Use  . Smoking status: Never Smoker  .  Smokeless tobacco: Former Systems developer    Types: Snuff  Substance Use Topics  . Alcohol use: No  . Drug use: No    Family History Family History  Problem Relation Age of Onset  . Hypertension Sister   . Cancer Brother   . Diabetes Paternal Aunt     Allergies  Allergen Reactions  . Morphine And Related Anaphylaxis  . Penicillins Other (See Comments) and Nausea And Vomiting    Makes patient sick Has patient had a PCN reaction causing immediate rash, facial/tongue/throat swelling, SOB or lightheadedness with hypotension: No Has patient had a PCN reaction causing severe rash involving mucus membranes or skin necrosis: No Has patient had a PCN reaction that required hospitalization: No Has patient had a PCN reaction occurring within the last 10 years: No If all of the above answers are "NO", then may proceed with Cephalosporin use.      REVIEW OF SYSTEMS  General: [ ]  Weight loss, [x ] Fever, [ ]  chills Neurologic: [ ]  Dizziness, [ ]  Blackouts, [ ]  Seizure [ ]  Stroke, [ ]  "Mini stroke", [ ]  Slurred speech, [ ]  Temporary blindness; [ ]  weakness in arms or legs, [ ]  Hoarseness [ ]  Dysphagia Cardiac: [ ]  Chest pain/pressure, [ ]  Shortness of breath at rest [ ]  Shortness of breath with exertion, [ ]  Atrial fibrillation or irregular heartbeat  Vascular: [ ]  Pain in legs with walking, [x ] Pain in legs at rest, [ ]  Pain in legs at night,  [ ]  Non-healing ulcer, [ ]  Blood clot in vein/DVT,   Pulmonary: [ ]  Home oxygen, [ ]  Productive cough, [ ]  Coughing up blood, [ ]  Asthma,  [ ]  Wheezing [ ]  COPD Musculoskeletal:  [ ]  Arthritis, [ ]  Low back pain, [ ]  Joint pain Hematologic: [ ]  Easy Bruising, [x ] Anemia; [ ]  Hepatitis Gastrointestinal: [ ]  Blood in stool, [ ]  Gastroesophageal  Reflux/heartburn, Urinary: [x ] chronic Kidney disease, [ ]  on HD - [ ]  MWF or [ ]  TTHS, [ ]  Burning with urination, [ ]  Difficulty urinating Skin: [ ]  Rashes, [x ] Wounds Psychological: [ ]  Anxiety, [ ]  Depression  Physical Examination Vitals:   11/06/18 0630 11/06/18 0700 11/06/18 0850 11/06/18 0852  BP: 121/63 115/78 116/75   Pulse: 78     Resp: (!) 29 18    Temp: 98.4 F (36.9 C)   99.4 F (37.4 C)  TempSrc: Oral   Oral  SpO2: 100%  100%    There is no height or weight on file to calculate BMI.  General:  WDWN in NAD HENT: WNL, normocephalic Eyes: Pupils equal Pulmonary: normal non-labored breathing , without Rales, rhonchi,  wheezing Cardiac: irregular , without  Murmurs, rubs or gallops; Abdomen: soft, NT, no masses Skin: no rashes, ulcers noted;  no Gangrene , positive cellulitis; positive open wounds left heel and lateral lower leg;   Vascular Exam/Pulses:radial B, left leg painful to movement unable to palpate LE pulses.  Musculoskeletal: no muscle wasting or atrophy; positive left LE edema  Neurologic: A&O X 3; Appropriate Affect ;  SENSATION: normal; hypersensitivity to touch left LE Speech is fluent/normal   Significant Diagnostic Studies: CBC Lab Results  Component Value Date   WBC 19.3 (H) 11/06/2018   HGB 6.7 (LL) 11/06/2018   HCT 21.3 (L) 11/06/2018   MCV 75.5 (L) 11/06/2018   PLT 379 11/06/2018    BMET    Component Value Date/Time   NA 136 11/06/2018 0227  K 3.3 (L) 11/06/2018 0227   CL 97 (L) 11/06/2018 0227   CO2 30 11/06/2018 0227   GLUCOSE 96 11/06/2018 0227   BUN 43 (H) 11/06/2018 0227   CREATININE 1.73 (H) 11/06/2018 0227   CALCIUM 7.4 (L) 11/06/2018 0227   GFRNONAA 25 (L) 11/06/2018 0227   GFRAA 29 (L) 11/06/2018 0227   Estimated Creatinine Clearance: 18.3 mL/min (A) (by C-G formula based on SCr of 1.73 mg/dL (H)).  COAG Lab Results  Component Value Date   INR 3.38 02/02/2018   INR 1.15 01/11/2012   INR 1.21 06/13/2011      Non-Invasive Vascular Imaging:  ARTERIAL DOPPLER STUDY: I have independently interpreted her arterial Doppler study today.  On the left side she has a monophasic posterior tibial and peroneal signal.  The dorsalis pedis signal is absent.  ABI is 48%.  A toe pressure cannot be obtained.  ASSESSMENT/PLAN:  PAD with non healing wounds left LE multilevel arterial disease.  Dr. Scot Dock did not think she was a candidate for revascularization.  She resides in a skilled facility and is WC bound for transportation.  She was scheduled for left AKA 11/12/2018.  Secondary to positive BC and fever we will proceed with left AKA today.    HGB 6.7 and she is currently receiving PRBC.  She has not had food or water today.  NPO order was laced.   Roxy Horseman 11/06/2018 9:31 AM   I have independently interviewed and examined patient and agree with PA assessment and plan above.  She is currently being transfused and is hemodynamically stable.  Even infection with bad wounds on left lower extremity we will plan above-knee amputation today.  She has been n.p.o. since last night.  I have posted her for operating room today and discussed with family.  Brandon C. Donzetta Matters, MD [facility] Office: [phone] Pager: [phone]

## 2018-11-06 NOTE — ED Notes (Signed)
Pt aspirated on H2o and Rx. Pt does not tolerate thin liquids well

## 2018-11-06 NOTE — Anesthesia Postprocedure Evaluation (Signed)
Anesthesia Post Note  Patient: [name]  Procedure(s) Performed: AMPUTATION ABOVE KNEE (Left )     Patient location during evaluation: PACU Anesthesia Type: General Level of consciousness: awake and alert Pain management: pain level controlled Vital Signs Assessment: post-procedure vital signs reviewed and stable Respiratory status: spontaneous breathing, nonlabored ventilation and respiratory function stable Cardiovascular status: blood pressure returned to baseline and stable Postop Assessment: no apparent nausea or vomiting Anesthetic complications: no    Last Vitals:  Vitals:   11/06/18 1430 11/06/18 1456  BP: 124/86 109/73  Pulse: 78 82  Resp: 14 15  Temp: 36.7 C 36.8 C  SpO2: 100% 100%    Last Pain:  Vitals:   11/06/18 1456  TempSrc: Oral  PainSc:                  [name]

## 2018-11-06 NOTE — Consult Note (Signed)
[place] for Infectious Disease    Date of Admission:  11/05/2018   Total days of antibiotics 1        Day 1 cefazolin                 Reason for Consult: MSSA Bacteremia     Referring Provider: [facility] autoconsult  Primary Care Provider: Janifer Adie, MD   Assessment: [name] is a 84 y.o. F with methicillin sensitive staphylococcus aureus bacteremia in the setting of long-standing wounds of the left foot. Likely source is chronic wounds; no other signs of symptoms of deep or metastatic infection present. She does not have cardiac murmur on exam. We attempted to arrange intraop TEE to limit sedation however unable to arrange d/t timing. Will check TTE tomorrow to rule out endocarditis involvement. Will repeat blood cultures tomorrow after amputation. Please do not place PICC until blood clears. I welcomed/answered all questions that [name] had.   Plan: 1. Narrow to cefazolin alone 2. TTE please (ordered)  3. Repeat Blood cultures tomorrow AM 4. Hold off on PICC line for now  Principal Problem:   Bacteremia due to methicillin susceptible Staphylococcus aureus (MSSA) Active Problems:   Cellulitis   Lower extremity ulceration, left, with necrosis of muscle (HCC)   Microcytic anemia   AKI (acute kidney injury) ([place])   CKD (chronic kidney disease), stage IV ([place])   . [MAR Hold] acetaminophen  650 mg Oral Once  . [MAR Hold] amiodarone  100 mg Oral Daily  . [MAR Hold] enoxaparin (LOVENOX) injection  30 mg Subcutaneous Q24H  . [MAR Hold] feeding supplement (ENSURE ENLIVE)  237 mL Oral Q1400  . fentaNYL      . [MAR Hold] Gerhardt's butt cream   Topical BID  . [MAR Hold] levothyroxine  88 mcg Oral QAC breakfast  . [MAR Hold] polyethylene glycol  17 g Oral Daily  . [MAR Hold] senna  1 tablet Oral BID  . [MAR Hold] sodium chloride flush  3 mL Intravenous Q12H    HPI: [name] is a 84 y.o. female brought to the hospital for evaluation after found  to have fevers and confusion at [employer]. She cannot provide much history. Daughter in law and chart review were done to obtain history.   Her daughter in law, Juliene Pina tells me that [name] has had trouble with chronic ulcerations/wounds to her left lower extremity since August of this year as well as a sacral wound. Initial wound on heel and toe started after friction blister evolved. She spends a lot of time in a wheelchair but have been elevating her legs recently and added air-loss pad to help heal sacrum.She has been working with wound therapy and apparently has been on multiple rounds of antibiotics for various things but most recently cellulitis in October. She is not certain which antibiotic she took but reports that she had improvement in symptoms. She does not report any allergies to any antibiotics but morphine causes facial swelling and difficulty breathing. She does not have any prosthetic joints or cardiac devices. She is in atrial fibrillation chronically and only anticoagulated with baby asprin. Only complaint the patient has is toe/foot pain (when touched) and constipation.  ER Course: VS stable, Lactic acid 1.7. Severe anemia with Hgb 6.7; leukocytosis to 19.3K. Started empirically on Vancomycin + Cefepime. Blood cultures have turned positive and BCID indicating MSSA. Vascular team has seen her and recommended  amputation and she is currently en route to go to short stay now.   Review of Systems  Unable to perform ROS: Dementia  Constitutional: Positive for chills.   Past Medical History:  Diagnosis Date  . Anemia   . Arthritis   . Atrial fibrillation ([place])   . Blood clot in vein   . Coronary artery disease   . DVT (deep venous thrombosis) ([place])   . GERD (gastroesophageal reflux disease)   . Hyperlipidemia   . Hypertension   . Mesenteric ischemia   . Stroke [facility])     Social History   Tobacco Use  . Smoking status: Never Smoker  . Smokeless tobacco:  Former Systems developer    Types: Snuff  Substance Use Topics  . Alcohol use: No  . Drug use: No    Family History  Problem Relation Age of Onset  . Hypertension Sister   . Cancer Brother   . Diabetes Paternal Aunt    Allergies  Allergen Reactions  . Morphine And Related Anaphylaxis  . Penicillins Other (See Comments) and Nausea And Vomiting    Makes patient sick Has patient had a PCN reaction causing immediate rash, facial/tongue/throat swelling, SOB or lightheadedness with hypotension: No Has patient had a PCN reaction causing severe rash involving mucus membranes or skin necrosis: No Has patient had a PCN reaction that required hospitalization: No Has patient had a PCN reaction occurring within the last 10 years: No If all of the above answers are "NO", then may proceed with Cephalosporin use.     OBJECTIVE: Blood pressure 124/60, pulse 83, temperature 99.2 F (37.3 C), temperature source Oral, resp. rate 16, last menstrual period 04/20/2018, SpO2 100 %.  Physical Exam  Constitutional:  Elderly. Resting quietly in bed.   HENT:  Mouth/Throat: Oropharynx is clear and moist.  Eyes: No scleral icterus.  Cardiovascular: Normal rate.  No murmur heard. afib on tele  Pulmonary/Chest: Effort normal. No respiratory distress. She has no rales.  Abdominal: Soft. Bowel sounds are normal. She exhibits no distension.  Musculoskeletal: She exhibits edema (LLE with edema and erythema up to mid shin with flaking skin. ).  Lymphadenopathy:    She has no cervical adenopathy.  Neurological: She is alert.  Skin: Skin is warm.  Ulcerated, necrotic wound on medial anterior shin that I did not measure considering upcoming amputation. #2 metatarsal is dark/black and swollen, painful with purulent drainage. Heel is covered.  Sacrum with scarred hypopigmented healed over areas with remaining fissure up gluteal cleft.     Lab Results Lab Results  Component Value Date   WBC 19.3 (H) 11/06/2018   HGB  6.7 (LL) 11/06/2018   HCT 21.3 (L) 11/06/2018   MCV 75.5 (L) 11/06/2018   PLT 379 11/06/2018    Lab Results  Component Value Date   CREATININE 1.73 (H) 11/06/2018   BUN 43 (H) 11/06/2018   NA 136 11/06/2018   K 3.3 (L) 11/06/2018   CL 97 (L) 11/06/2018   CO2 30 11/06/2018    Lab Results  Component Value Date   ALT 52 (H) 11/06/2018   AST 112 (H) 11/06/2018   ALKPHOS 101 11/06/2018   BILITOT 1.1 11/06/2018     Microbiology: Recent Results (from the past 240 hour(s))  Blood Culture (routine x 2)     Status: None (Preliminary result)   Collection Time: 11/05/18  7:05 PM  Result Value Ref Range Status   Specimen Description BLOOD RIGHT ANTECUBITAL  Final   Special Requests  Final    BOTTLES DRAWN AEROBIC AND ANAEROBIC Blood Culture adequate volume   Culture  Setup Time   Final    GRAM POSITIVE COCCI IN BOTH AEROBIC AND ANAEROBIC BOTTLES CRITICAL VALUE NOTED.  VALUE IS CONSISTENT WITH PREVIOUSLY REPORTED AND CALLED VALUE. Performed at [facility], [place] [address], [place], [place] [address]    Culture GRAM POSITIVE COCCI  Final   Report Status PENDING  Incomplete  Blood Culture (routine x 2)     Status: None (Preliminary result)   Collection Time: 11/05/18  7:30 PM  Result Value Ref Range Status   Specimen Description BLOOD RIGHT ARM UPPER  Final   Special Requests   Final    BOTTLES DRAWN AEROBIC AND ANAEROBIC Blood Culture adequate volume   Culture  Setup Time   Final    GRAM POSITIVE COCCI IN BOTH AEROBIC AND ANAEROBIC BOTTLES CRITICAL RESULT CALLED TO, READ BACK BY AND VERIFIED WITH: PHARMD M Carter 11/06/18 AT 745 AM B Y CM Performed at [facility], [place] [address] [place], [place] [address]    Culture GRAM POSITIVE COCCI  Final   Report Status PENDING  Incomplete  Blood Culture ID Panel (Reflexed)     Status: Abnormal   Collection Time: 11/05/18  7:30 PM  Result Value Ref Range Status   Enterococcus species NOT DETECTED NOT DETECTED Final    Listeria monocytogenes NOT DETECTED NOT DETECTED Final   Staphylococcus species DETECTED (A) NOT DETECTED Final    Comment: CRITICAL RESULT CALLED TO, READ BACK BY AND VERIFIED WITH: PHARMD M MACCIA 11/06/18 AT 745 AM BY CM    Staphylococcus aureus (BCID) DETECTED (A) NOT DETECTED Final    Comment: Methicillin (oxacillin) susceptible Staphylococcus aureus (MSSA). Preferred therapy is anti staphylococcal beta lactam antibiotic (Cefazolin or Nafcillin), unless clinically contraindicated. CRITICAL RESULT CALLED TO, READ BACK BY AND VERIFIED WITH: PHARMD M [place] 11/06/18 AT 745 AM BY CM    Methicillin resistance NOT DETECTED NOT DETECTED Final   Streptococcus species NOT DETECTED NOT DETECTED Final   Streptococcus agalactiae NOT DETECTED NOT DETECTED Final   Streptococcus pneumoniae NOT DETECTED NOT DETECTED Final   Streptococcus pyogenes NOT DETECTED NOT DETECTED Final   Acinetobacter baumannii NOT DETECTED NOT DETECTED Final   Enterobacteriaceae species NOT DETECTED NOT DETECTED Final   Enterobacter cloacae complex NOT DETECTED NOT DETECTED Final   Escherichia coli NOT DETECTED NOT DETECTED Final   Klebsiella oxytoca NOT DETECTED NOT DETECTED Final   Klebsiella pneumoniae NOT DETECTED NOT DETECTED Final   Proteus species NOT DETECTED NOT DETECTED Final   Serratia marcescens NOT DETECTED NOT DETECTED Final   Haemophilus influenzae NOT DETECTED NOT DETECTED Final   Neisseria meningitidis NOT DETECTED NOT DETECTED Final   Pseudomonas aeruginosa NOT DETECTED NOT DETECTED Final   Candida albicans NOT DETECTED NOT DETECTED Final   Candida glabrata NOT DETECTED NOT DETECTED Final   Candida krusei NOT DETECTED NOT DETECTED Final   Candida parapsilosis NOT DETECTED NOT DETECTED Final   Candida tropicalis NOT DETECTED NOT DETECTED Final    Comment: Performed at [facility], [place]. [address] [place], [place] [address]         [facility] Staphylococcus  aureus bacteremia   Staphylococcus aureus bacteremia (SAB) is associated with a high rate of complications and mortality.  Specific aspects of clinical management are critical to optimizing the outcome of patients with SAB.  Therefore, the [facility] Antimicrobial Management Team ([facility])  has initiated an intervention aimed at improving the management of SAB at [facility].  To do so, Infectious Diseases physicians are providing an evidence-based consult for the management of all patients with SAB.     Yes No Comments  Perform follow-up blood cultures (even if the patient is afebrile) to ensure clearance of bacteremia [x]  []  For 12/04  Remove vascular catheter and obtain follow-up blood cultures after the removal of the catheter []  [x]  n/a  Perform echocardiography to evaluate for endocarditis (transthoracic ECHO is 40-50% sensitive, TEE is > 90% sensitive) [x]  []  TTE ordered    Consult electrophysiologist to evaluate implanted cardiac device (pacemaker, ICD) []  [x]  N/a   Ensure source control []  []  AKA 11/06/18 for presumed LLE wound source; awaiting TTE  Investigate for "metastatic" sites of infection [x]  []  No other symptoms to suggest alt source of infection   Change antibiotic therapy to cefazolin  []  []  Beta-lactam antibiotics are preferred for MSSA due to higher cure rates.   If on Vancomycin, goal trough should be 15 - 20 mcg/mL  Estimated duration of IV antibiotic therapy:   []  []  Consult case management for probably prolonged outpatient IV antibiotic therapy    Janene Madeira, MSN, NP-C [facility] [place] Cell: [phone] Pager: [phone]  11/06/2018 12:29 PM

## 2018-11-07 ENCOUNTER — Encounter (HOSPITAL_COMMUNITY): Payer: Self-pay | Admitting: Vascular Surgery

## 2018-11-07 ENCOUNTER — Inpatient Hospital Stay (HOSPITAL_COMMUNITY): Payer: Medicare (Managed Care)

## 2018-11-07 DIAGNOSIS — Z9889 Other specified postprocedural states: Secondary | ICD-10-CM

## 2018-11-07 DIAGNOSIS — I340 Nonrheumatic mitral (valve) insufficiency: Secondary | ICD-10-CM

## 2018-11-07 DIAGNOSIS — R7881 Bacteremia: Secondary | ICD-10-CM

## 2018-11-07 DIAGNOSIS — E162 Hypoglycemia, unspecified: Secondary | ICD-10-CM

## 2018-11-07 DIAGNOSIS — Z89611 Acquired absence of right leg above knee: Secondary | ICD-10-CM

## 2018-11-07 DIAGNOSIS — R1310 Dysphagia, unspecified: Secondary | ICD-10-CM

## 2018-11-07 DIAGNOSIS — B9561 Methicillin susceptible Staphylococcus aureus infection as the cause of diseases classified elsewhere: Secondary | ICD-10-CM

## 2018-11-07 DIAGNOSIS — I361 Nonrheumatic tricuspid (valve) insufficiency: Secondary | ICD-10-CM

## 2018-11-07 LAB — GLUCOSE, CAPILLARY
Glucose-Capillary: 55 mg/dL — ABNORMAL LOW (ref 70–99)
Glucose-Capillary: 131 mg/dL — ABNORMAL HIGH (ref 70–99)

## 2018-11-07 LAB — BPAM RBC
Blood Product Expiration Date: 201912272359
ISSUE DATE / TIME: 201912030842
Unit Type and Rh: 6200

## 2018-11-07 LAB — URINE CULTURE: Culture: NO GROWTH

## 2018-11-07 LAB — BASIC METABOLIC PANEL
Anion gap: 13 (ref 5–15)
BUN: 38 mg/dL — ABNORMAL HIGH (ref 8–23)
CHLORIDE: 104 mmol/L (ref 98–111)
CO2: 24 mmol/L (ref 22–32)
CREATININE: 1.76 mg/dL — AB (ref 0.44–1.00)
Calcium: 7.5 mg/dL — ABNORMAL LOW (ref 8.9–10.3)
GFR calc Af Amer: 29 mL/min — ABNORMAL LOW (ref >60)
GFR calc non Af Amer: 25 mL/min — ABNORMAL LOW (ref >60)
Glucose, Bld: 57 mg/dL — ABNORMAL LOW (ref 70–99)
Potassium: 4 mmol/L (ref 3.5–5.1)
Sodium: 141 mmol/L (ref 135–145)

## 2018-11-07 LAB — ECHOCARDIOGRAM COMPLETE

## 2018-11-07 LAB — CBC
HCT: 24.5 % — ABNORMAL LOW (ref 36.0–46.0)
Hemoglobin: 7.9 g/dL — ABNORMAL LOW (ref 12.0–15.0)
MCH: 24.6 pg — ABNORMAL LOW (ref 26.0–34.0)
MCHC: 32.2 g/dL (ref 30.0–36.0)
MCV: 76.3 fL — ABNORMAL LOW (ref 80.0–100.0)
Platelets: 361 10*3/uL (ref 150–400)
RBC: 3.21 MIL/uL — ABNORMAL LOW (ref 3.87–5.11)
RDW: 17.4 % — ABNORMAL HIGH (ref 11.5–15.5)
WBC: 15.9 10*3/uL — ABNORMAL HIGH (ref 4.0–10.5)
nRBC: 0 % (ref 0.0–0.2)

## 2018-11-07 LAB — TYPE AND SCREEN
ABO/RH(D): A POS
Antibody Screen: NEGATIVE
Unit division: 0

## 2018-11-07 MED ORDER — AMIODARONE HCL 200 MG PO TABS
100.0000 mg | ORAL_TABLET | Freq: Once | ORAL | Status: AC
  Administered 2018-11-07: 100 mg via ORAL
  Filled 2018-11-07: qty 1

## 2018-11-07 MED ORDER — POLYETHYLENE GLYCOL 3350 17 G PO PACK
17.0000 g | PACK | Freq: Once | ORAL | Status: AC
  Administered 2018-11-07: 17 g via ORAL
  Filled 2018-11-07: qty 1

## 2018-11-07 MED ORDER — DEXTROSE 50 % IV SOLN
INTRAVENOUS | Status: AC
  Administered 2018-11-07: 50 mL
  Filled 2018-11-07: qty 50

## 2018-11-07 MED ORDER — DEXTROSE-NACL 5-0.45 % IV SOLN
INTRAVENOUS | Status: DC
  Administered 2018-11-07 (×2): via INTRAVENOUS

## 2018-11-07 NOTE — Progress Notes (Addendum)
  Date: 11/07/2018  Patient name: JASMARIE COPPOCK  Medical record number: [id number]  Date of birth: [birth year]   I have seen and evaluated this patient and I have discussed the plan of care with the house staff. Please see Dr. Cristal Generous note for complete details. I concur with her findings.    SLP team has placed orders for regular diet which I reviewed.  I do not see a note from SLP yet, but would follow up their note prior to attempting solids with Ms. Crandall.   Sid Falcon, MD 11/07/2018, 1:35 PM

## 2018-11-07 NOTE — NC FL2 (Signed)
[place] LEVEL OF CARE SCREENING TOOL     IDENTIFICATION  Patient Name: [name] Birthdate: 07/17/1925 Sex: female Admission Date (Current Location): 11/05/2018  [facility] and [place] Number:  Herbalist and Address:  [facility] [place]. [facility], [place] [address], [place], [place] [address]      Provider Number: [id number]  Attending Physician Name and Address:  Sid Falcon, MD  Relative Name and Phone Number:       Current Level of Care: Hospital Recommended Level of Care: [place] Prior Approval Number:    Date Approved/Denied:   PASRR Number: [id number]  Discharge Plan: SNF    Current Diagnoses: Patient Active Problem List   Diagnosis Date Noted  . Bacteremia due to methicillin susceptible Staphylococcus aureus (MSSA) 11/06/2018  . Lower extremity ulceration, left, with necrosis of muscle ([place]) 11/06/2018  . Cellulitis 11/05/2018  . Malnutrition of moderate degree 08/22/2018  . SBO (small bowel obstruction) ([place]) 08/18/2018  . Acute lower UTI 08/18/2018  . Lactic acid acidosis 08/18/2018  . CKD (chronic kidney disease), stage IV ([place]) 08/18/2018  . Heel ulcer ([place]) 08/18/2018  . Pressure ulcer, heel 08/18/2018  . Acute renal failure superimposed on stage 4 chronic kidney disease ([place])   . Non-cardiac chest pain 07/04/2017  . Aortic atherosclerosis ([place]) 07/04/2017  . AKI (acute kidney injury) ([place]) 07/04/2017  . Hypothyroidism 07/04/2017  . History of atrial flutter 04/19/2013  . CAD (coronary artery disease) 04/19/2013  . Dizziness 04/07/2012    Class: Acute  . PAD (peripheral artery disease) ([place]) 07/07/2011  . Weight loss 06/01/2011  . Wears dentures 06/01/2011  . Hypertension 06/01/2011  . Heart disease 06/01/2011  . Hyperlipidemia 06/01/2011  . Abdominal pain 06/01/2011  . Microcytic anemia 06/01/2011  . Glaucoma or retinopathy 06/01/2011  . Arthritis 06/01/2011  . Family history of breast  cancer 06/01/2011    Orientation RESPIRATION BLADDER Height & Weight     Self, Time, Situation, Place  Normal Incontinent, External catheter Weight:   Height:     BEHAVIORAL SYMPTOMS/MOOD NEUROLOGICAL BOWEL NUTRITION STATUS  (None) (None) Continent (Currently NPO. See diet on discharge summary when available.)  AMBULATORY STATUS COMMUNICATION OF NEEDS Skin     Verbally Surgical wounds                       Personal Care Assistance Level of Assistance              Functional Limitations Info  Sight, Hearing, Speech Sight Info: Adequate Hearing Info: Adequate Speech Info: Adequate    SPECIAL CARE FACTORS FREQUENCY                       Contractures Contractures Info: Not present    Additional Factors Info  Code Status, Allergies Code Status Info: Partial code: DNI Allergies Info: Morphine and related, Penicillins.           Current Medications (11/07/2018):  This is the current hospital active medication list Current Facility-Administered Medications  Medication Dose Route Frequency Provider Last Rate Last Dose  . acetaminophen (TYLENOL) tablet 650 mg  650 mg Oral Q6H PRN Laurence Slate M, PA-C       Or  . acetaminophen (TYLENOL) suppository 650 mg  650 mg Rectal Q6H PRN Laurence Slate M, PA-C      . acetaminophen (TYLENOL) tablet 650 mg  650 mg Oral Once Ulyses Amor, [place]      .  amiodarone (PACERONE) tablet 100 mg  100 mg Oral Daily Laurence Slate M, PA-C      . aspirin EC tablet 81 mg  81 mg Oral Daily Laurence Slate M, [place]      . bisacodyl (DULCOLAX) suppository 10 mg  10 mg Rectal Daily PRN Laurence Slate M, PA-C      . calcium citrate (CALCITRATE - dosed in mg elemental calcium) tablet 200 mg of elemental calcium  200 mg of elemental calcium Oral BID WC Collins, Emma M, PA-C      . ceFAZolin (ANCEF) IVPB 2g/100 mL premix  2 g Intravenous Q12H Alvira Philips, RPH 200 mL/hr at 11/07/18 0655 2 g at 11/07/18 0655  . Darbepoetin Alfa (ARANESP)  injection 60 mcg  60 mcg Subcutaneous Q14 Days Laurence Slate M, PA-C      . dextrose 5 %-0.45 % sodium chloride infusion   Intravenous Continuous Isabelle Course, MD 75 mL/hr at 11/07/18 [phone]    . enoxaparin (LOVENOX) injection 30 mg  30 mg Subcutaneous Q24H Laurence Slate M, PA-C   30 mg at 11/07/18 1259  . feeding supplement (ENSURE ENLIVE) (ENSURE ENLIVE) liquid 237 mL  237 mL Oral Q1400 Ulyses Amor, PA-C      . feeding supplement (PRO-STAT SUGAR FREE 64) liquid 30 mL  30 mL Oral BID WC Ulyses Amor, PA-C      . Gerhardt's butt cream   Topical BID Ulyses Amor, PA-C      . HYDROmorphone (DILAUDID) injection 0.5 mg  0.5 mg Intravenous Q4H PRN Mosetta Anis, MD      . Derrill Memo ON 11/08/2018] levothyroxine (SYNTHROID, LEVOTHROID) tablet 88 mcg  88 mcg Oral QAC breakfast Schorr, Rhetta Mura, NP      . ondansetron [facility]) injection 4 mg  4 mg Intravenous Q6H PRN Laurence Slate M, PA-C   4 mg at 11/06/18 1256  . ondansetron (ZOFRAN) tablet 4 mg  4 mg Oral Q8H PRN Laurence Slate M, PA-C      . pantoprazole (PROTONIX) EC tablet 20 mg  20 mg Oral QAC breakfast Theda Sers, Emma M, PA-C      . polyethylene glycol (MIRALAX / GLYCOLAX) packet 17 g  17 g Oral Daily Theda Sers, Emma M, PA-C      . pravastatin (PRAVACHOL) tablet 40 mg  40 mg Oral QHS Collins, Emma M, PA-C      . senna-docusate (Senokot-S) tablet 2 tablet  2 tablet Oral BID Laurence Slate M, PA-C      . sodium chloride flush (NS) 0.9 % injection 3 mL  3 mL Intravenous Q12H Collins, Emma M, PA-C      . sodium phosphate (FLEET) 7-19 GM/118ML enema 1 enema  1 enema Rectal Daily PRN Ulyses Amor, PA-C         Discharge Medications: Please see discharge summary for a list of discharge medications.  Relevant Imaging Results:  Relevant Lab Results:   Additional Information SS#: [id number]  Candie Chroman, LCSW

## 2018-11-07 NOTE — Progress Notes (Signed)
Per MD pt choked on potassium pills in ED. MD want to have RN do yale swallow screen. Performed assessment and mouth care prior to swallow. During drinking pt stopped multiple times and coughed several times. Awaiting SLP consult that was previously placed. Will continue to monitor.  Amanda Cockayne, RN

## 2018-11-07 NOTE — Progress Notes (Signed)
Hypoglycemic Event  CBG: 55  Treatment: amp of D50  Symptoms: None  Follow-up CBG: KDTO:[id number] CBG Result:131  Possible Reasons for Event: Patient is NPO  Comments/MD notified: On-call notified    Elicia Lamp

## 2018-11-07 NOTE — Progress Notes (Signed)
PT Cancellation Note  Patient Details Name: [name] MRN: [id number] DOB: 1925-07-17   Cancelled Treatment:    Reason Eval/Treat Not Completed: PT screened, no needs identified, will sign off. Per discussion with pt and pt family, prior to admission, pt residing at [facility] and not receiving therapy services. She requires use of a lift for all activities out of bed even prior to new left AKA and cannot propel a wheelchair secondary to shoulder dislocation. Pt family stating they have no acute PT needs. PT signing off. Thank you for the consult.  Ellamae Sia, PT, DPT Acute Rehabilitation Services Pager [phone] Office [phone]    Willy Eddy 11/07/2018, 2:44 PM

## 2018-11-07 NOTE — Progress Notes (Addendum)
     Subjective  - Doing OK no new complaints.  No complaints of pain.   Objective 107/75 86 98.4 F (36.9 C) (Oral) 15 98%  Intake/Output Summary (Last 24 hours) at 11/07/2018 0800 Last data filed at 11/06/2018 1430 Gross per 24 hour  Intake 1774 ml  Output 50 ml  Net 1724 ml    Left AKA dressing clean and dry.  Assessment/Planning: POD # 1 Left AKA  Plan to change dressing tomorrow and then daily after that. PT eval ordered. Will consult Social work for SNF help.  She will likely return to SNF from here when medically stable.  Roxy Horseman 11/07/2018 8:00 AM --  Laboratory Lab Results: Recent Labs    11/06/18 1341 11/07/18 0457  WBC 17.3* 15.9*  HGB 8.1* 7.9*  HCT 24.5* 24.5*  PLT 338 361   BMET Recent Labs    11/06/18 0227 11/07/18 0457  NA 136 141  K 3.3* 4.0  CL 97* 104  CO2 30 24  GLUCOSE 96 57*  BUN 43* 38*  CREATININE 1.73* 1.76*  CALCIUM 7.4* 7.5*    COAG Lab Results  Component Value Date   INR 3.38 02/02/2018   INR 1.15 01/11/2012   INR 1.21 06/13/2011   No results found for: PTT   I have independently interviewed and examined patient and agree with PA assessment and plan above.   Mica Releford C. Donzetta Matters, MD [facility] Office: [phone] Pager: [phone]

## 2018-11-07 NOTE — Progress Notes (Addendum)
   Subjective: Hypoglycemic this morning to 55, asymptomatic. Remains npo for aspiration risk. Denies pain. Slept well.   Objective:  Vital signs in last 24 hours: Vitals:   11/06/18 1414 11/06/18 1430 11/06/18 1456 11/07/18 0431  BP: 108/82 124/86 109/73 107/75  Pulse: 74 78 82 86  Resp: 14 14 15    Temp:  98 F (36.7 C) 98.3 F (36.8 C) 98.4 F (36.9 C)  TempSrc:   Oral Oral  SpO2: 95% 100% 100% 98%   Gen: sleeping comfortably, easily arousable and oriented Cardiac: RRR, no m/r/g Pulm: CTAB Ext: left stump bandages are clean and dry  Micro:  BCx 12/2: GPCs, rapid biofire shows MSSA UCx 12/2: no growth, final Repeat BCx 12/4: pending   Antibiotics: Vanco 12/2 one dose Cefepime 12/2 one dose Cefazolin (12/3 - present)  Assessment/Plan:  Principal Problem:   Bacteremia due to methicillin susceptible Staphylococcus aureus (MSSA) Active Problems:   Microcytic anemia   AKI (acute kidney injury) (HCC)   CKD (chronic kidney disease), stage IV (HCC)   Cellulitis   Lower extremity ulceration, left, with necrosis of muscle ([place])  84 year old female with Alzheimer's dementia, CAD, A. fib/flutter, hypertension, CKD 4, PVD status post right AKA who presented with 2 days of fever worsening and left leg pain in the setting of nonhealing wounds and leukocytosis.  X-rays negative for osteomyelitis  1.  Bacteremia 2/2 left leg cellulitis and nonhealing wounds: s/p left AKA 12/3. Doing well. - Last fever 12/2 @ 7pm 101.82F - continue cefazolin - f/u repeat blood cultures  - PT eval   2. Dysphagia: denies difficulty swallowing at SNF but she aspirated when trying to take po potassium pill and did not pass nursing bedside swallow eval - npo for aspiration risk - switch IVFs to D5 1/2 NS to prevent hypoglycemia  - formal swallow eval per speech therapy   3.  AKI on CKD: Creatine unchanged this morning. Hypovolemic on exam. Patient has been npo but receiving maintenance fluids.  -  hold lasix - continue IVFs - recheck BMP tomorrow   4. Anemia of chronic disease: s/p 1 unit PRBCs. Hemoglobin stable at 7.9. Baseline appears to be around 9.   - f/u FOBT - continue aranesp, next dose due 12/4   Afib/flutter: convert po amiodarone to IV while patient is npo HFpEF: hypovolemic. Holding lasix  Constipation: chronic. Last BM 3-4 days ago. Continue scheduled miralax and senokot    Dispo: Anticipated discharge in approximately 2-3 day(s).   Isabelle Course, MD 11/07/2018, 8:38 AM Pager: [phone]

## 2018-11-07 NOTE — Progress Notes (Signed)
  Echocardiogram 2D Echocardiogram has been performed.  Jennette Dubin 11/07/2018, 4:25 PM

## 2018-11-07 NOTE — Progress Notes (Signed)
INFECTIOUS DISEASE PROGRESS NOTE  ID: [name] is a 84 y.o. female with  Principal Problem:   Bacteremia due to methicillin susceptible Staphylococcus aureus (MSSA) Active Problems:   Microcytic anemia   AKI (acute kidney injury) ([place])   CKD (chronic kidney disease), stage IV (HCC)   Cellulitis   Lower extremity ulceration, left, with necrosis of muscle (HCC)  Subjective: A & A, c/o hunger. No pain.   Abtx:  Anti-infectives (From admission, onward)   Start     Dose/Rate Route Frequency Ordered Stop   11/07/18 2000  vancomycin (VANCOCIN) IVPB 750 mg/150 ml premix  Status:  Discontinued     750 mg 150 mL/hr over 60 Minutes Intravenous Every 48 hours 11/05/18 2228 11/06/18 0812   11/06/18 2200  ceFAZolin (ANCEF) IVPB 2g/100 mL premix     2 g 200 mL/hr over 30 Minutes Intravenous Every 12 hours 11/06/18 1502     11/05/18 2300  ceFEPIme (MAXIPIME) 1 g in sodium chloride 0.9 % 100 mL IVPB  Status:  Discontinued     1 g 200 mL/hr over 30 Minutes Intravenous Every 24 hours 11/05/18 2241 11/06/18 1200   11/05/18 1930  vancomycin (VANCOCIN) IVPB 1000 mg/200 mL premix     1,000 mg 200 mL/hr over 60 Minutes Intravenous  Once 11/05/18 1921 11/05/18 2055      Medications:  Scheduled: . acetaminophen  650 mg Oral Once  . amiodarone  100 mg Oral Daily  . aspirin EC  81 mg Oral Daily  . calcium citrate  200 mg of elemental calcium Oral BID WC  . Darbepoetin Alfa  60 mcg Subcutaneous Q14 Days  . enoxaparin (LOVENOX) injection  30 mg Subcutaneous Q24H  . feeding supplement (ENSURE ENLIVE)  237 mL Oral Q1400  . feeding supplement (PRO-STAT SUGAR FREE 64)  30 mL Oral BID WC  . Gerhardt's butt cream   Topical BID  . [START ON 11/08/2018] levothyroxine  88 mcg Oral QAC breakfast  . pantoprazole  20 mg Oral QAC breakfast  . polyethylene glycol  17 g Oral Daily  . pravastatin  40 mg Oral QHS  . senna-docusate  2 tablet Oral BID  . sodium chloride flush  3 mL Intravenous Q12H     Objective: Vital signs in last 24 hours: Temp:  [98 F (36.7 C)-99.4 F (37.4 C)] 98.4 F (36.9 C) (12/04 0431) Pulse Rate:  [74-86] 86 (12/04 0431) Resp:  [11-17] 15 (12/03 1456) BP: (97-152)/(58-96) 107/75 (12/04 0431) SpO2:  [95 %-100 %] 98 % (12/04 0431)   General appearance: alert, cooperative and no distress Resp: clear to auscultation bilaterally Cardio: regular rate and rhythm GI: normal findings: bowel sounds normal and soft, non-tender Extremities: R stump dressed, clean.   Lab Results Recent Labs    11/06/18 0227 11/06/18 1341 11/07/18 0457  WBC 19.3* 17.3* 15.9*  HGB 6.7* 8.1* 7.9*  HCT 21.3* 24.5* 24.5*  NA 136  --  141  K 3.3*  --  4.0  CL 97*  --  104  CO2 30  --  24  BUN 43*  --  38*  CREATININE 1.73*  --  1.76*   Liver Panel Recent Labs    11/05/18 1859 11/06/18 0227  PROT 7.8 7.2  ALBUMIN 1.7* 1.5*  AST 122* 112*  ALT 54* 52*  ALKPHOS 108 101  BILITOT 1.0 1.1   Sedimentation Rate No results for input(s): ESRSEDRATE in the last 72 hours. C-Reactive Protein No results for input(s): CRP  in the last 72 hours.  Microbiology: Recent Results (from the past 240 hour(s))  Blood Culture (routine x 2)     Status: Abnormal (Preliminary result)   Collection Time: 11/05/18  7:05 PM  Result Value Ref Range Status   Specimen Description BLOOD RIGHT ANTECUBITAL  Final   Special Requests   Final    BOTTLES DRAWN AEROBIC AND ANAEROBIC Blood Culture adequate volume   Culture  Setup Time   Final    GRAM POSITIVE COCCI IN BOTH AEROBIC AND ANAEROBIC BOTTLES CRITICAL VALUE NOTED.  VALUE IS CONSISTENT WITH PREVIOUSLY REPORTED AND CALLED VALUE. Performed at [facility], [place] [address] [place], [place] [address]    Culture STAPHYLOCOCCUS AUREUS (A)  Final   Report Status PENDING  Incomplete  Blood Culture (routine x 2)     Status: None (Preliminary result)   Collection Time: 11/05/18  7:30 PM  Result Value Ref Range Status   Specimen  Description BLOOD RIGHT ARM UPPER  Final   Special Requests   Final    BOTTLES DRAWN AEROBIC AND ANAEROBIC Blood Culture adequate volume   Culture  Setup Time   Final    GRAM POSITIVE COCCI IN BOTH AEROBIC AND ANAEROBIC BOTTLES CRITICAL RESULT CALLED TO, READ BACK BY AND VERIFIED WITH: PHARMD M [place] 11/06/18 AT 745 AM B Y CM Performed at [facility], [place] [address], [place], [place] [address]    Culture GRAM POSITIVE COCCI  Final   Report Status PENDING  Incomplete  Blood Culture ID Panel (Reflexed)     Status: Abnormal   Collection Time: 11/05/18  7:30 PM  Result Value Ref Range Status   Enterococcus species NOT DETECTED NOT DETECTED Final   Listeria monocytogenes NOT DETECTED NOT DETECTED Final   Staphylococcus species DETECTED (A) NOT DETECTED Final    Comment: CRITICAL RESULT CALLED TO, READ BACK BY AND VERIFIED WITH: PHARMD M [place] 11/06/18 AT 745 AM BY CM    Staphylococcus aureus (BCID) DETECTED (A) NOT DETECTED Final    Comment: Methicillin (oxacillin) susceptible Staphylococcus aureus (MSSA). Preferred therapy is anti staphylococcal beta lactam antibiotic (Cefazolin or Nafcillin), unless clinically contraindicated. CRITICAL RESULT CALLED TO, READ BACK BY AND VERIFIED WITH: PHARMD M [place] 11/06/18 AT 745 AM BY CM    Methicillin resistance NOT DETECTED NOT DETECTED Final   Streptococcus species NOT DETECTED NOT DETECTED Final   Streptococcus agalactiae NOT DETECTED NOT DETECTED Final   Streptococcus pneumoniae NOT DETECTED NOT DETECTED Final   Streptococcus pyogenes NOT DETECTED NOT DETECTED Final   Acinetobacter baumannii NOT DETECTED NOT DETECTED Final   Enterobacteriaceae species NOT DETECTED NOT DETECTED Final   Enterobacter cloacae complex NOT DETECTED NOT DETECTED Final   Escherichia coli NOT DETECTED NOT DETECTED Final   Klebsiella oxytoca NOT DETECTED NOT DETECTED Final   Klebsiella pneumoniae NOT DETECTED NOT DETECTED Final   Proteus species NOT DETECTED  NOT DETECTED Final   Serratia marcescens NOT DETECTED NOT DETECTED Final   Haemophilus influenzae NOT DETECTED NOT DETECTED Final   Neisseria meningitidis NOT DETECTED NOT DETECTED Final   Pseudomonas aeruginosa NOT DETECTED NOT DETECTED Final   Candida albicans NOT DETECTED NOT DETECTED Final   Candida glabrata NOT DETECTED NOT DETECTED Final   Candida krusei NOT DETECTED NOT DETECTED Final   Candida parapsilosis NOT DETECTED NOT DETECTED Final   Candida tropicalis NOT DETECTED NOT DETECTED Final    Comment: Performed at [facility], [place]. [address] [place], [place] [address]  Urine culture     Status: None   Collection Time: 11/05/18  7:50 PM  Result Value Ref Range Status   Specimen Description URINE, CATHETERIZED  Final   Special Requests NONE  Final   Culture   Final    NO GROWTH Performed at [facility], [address], [place], [place] [address]    Report Status 11/07/2018 FINAL  Final    Studies/Results: Dg Chest 1 View  Result Date: 11/05/2018 CLINICAL DATA:  84 year old female with shortness of breath and fever. EXAM: CHEST  1 VIEW COMPARISON:  Chest radiograph dated 08/17/2018 FINDINGS: A rounded area of density at the right lung base appears new compared to prior study. Although this may represent infection of the diaphragm a focal area of consolidation or rounded atelectasis is not excluded. The left lung is clear. There is no pleural effusion or pneumothorax. Stable moderate cardiomegaly. Calcified mediastinal and hilar lymph nodes. Osteopenia with degenerative changes of the spine and shoulders. No acute osseous pathology. IMPRESSION: 1. Right lung base density may represent rounded atelectasis. Pneumonia is not excluded. 2. Cardiomegaly. Electronically Signed   By: Anner Crete M.D.   On: 11/05/2018 21:09   Dg Ankle 2 Views Left  Result Date: 11/05/2018 CLINICAL DATA:  Infection.  Fever, foot and ankle pain. EXAM: LEFT ANKLE - 2 VIEW COMPARISON:  Foot  performed today. FINDINGS: Diffuse osteopenia. No acute bony abnormality. Specifically, no fracture, subluxation, or dislocation. IMPRESSION: No acute bony abnormality. Electronically Signed   By: Rolm Baptise M.D.   On: 11/05/2018 21:09   Dg Foot Complete Left  Result Date: 11/05/2018 CLINICAL DATA:  Fever.  Left foot pain. EXAM: LEFT FOOT - COMPLETE 3+ VIEW COMPARISON:  12/18/2012 FINDINGS: Moderate osteoarthritic changes at the 1st MTP joint. Diffuse osteopenia. No bone destruction to suggest osteomyelitis. Soft tissue swelling along the dorsum of the foot. IMPRESSION: No radiographic changes of osteomyelitis. Diffuse osteopenia. No acute bony abnormality. Electronically Signed   By: Rolm Baptise M.D.   On: 11/05/2018 21:08     Assessment/Plan: R AKA MSSA bacteremia  Total days of antibiotics: 2 ancef  Await repeat BCx sent this AM TTE to be done today Continue ancef WBC improving.          Bobby Rumpf MD, FACP Infectious Diseases (pager) [phone] [email or web address] 11/07/2018, 9:05 AM  LOS: 2 days

## 2018-11-07 NOTE — Clinical Social Work Note (Signed)
Clinical Social Work Assessment  Patient Details  Name: [name] MRN: [id number] Date of Birth: 01/28/1925  Date of referral:  11/07/18               Reason for consult:  Discharge Planning                Permission sought to share information with:  Family Supports, Facility Contact Representative Permission granted to share information::     Name::     Myra Cheslock  Agency::  [facility]  Relationship::  Daughter-in-law  Contact Information:  [phone]  Housing/Transportation Living arrangements for the past 2 months:  Skilled Nursing Facility Source of Information:  Patient, Medical Team, Adult Children, Facility Patient Interpreter Needed:  None Criminal Activity/Legal Involvement Pertinent to Current Situation/Hospitalization:  No - Comment as needed Significant Relationships:  Adult Children Lives with:  Facility Resident Do you feel safe going back to the place where you live?  Yes Need for family participation in patient care:  Yes (Comment)  Care giving concerns:  Patient is a long-term resident from [facility].   Social Worker assessment / plan:  CSW met with patient. Daughter-in-law at bedside. CSW introduced role and explained that discharge planning would be discussed. Patient confirmed she is from [facility] and plans to return at discharge. Per admissions coordinator she is a long-term resident. Confirmed plan with [facility] social worker as well. No further concerns. CSW encouraged patient and her daughter-in-law to contact CSW as needed. CSW will continue to follow patient and her daughter-in-law for support and facilitate discharge back to SNF once medically stable.  Employment status:  Retired Insurance information:  Other (Comment Required)([facility]) PT Recommendations:  Not assessed at this time Information / Referral to community resources:  Skilled Nursing Facility  Patient/Family's Response to care:  Patient and her daughter-in-law agreeable to  return to SNF. Patient's son and daughter-in-law supportive and involved in patient's care. Patient and her daughter-in-law appreciated social work intervention.  Patient/Family's Understanding of and Emotional Response to Diagnosis, Current Treatment, and Prognosis:  Patient and her daughter-in-law have a good understanding of the reason for admission and plan to return to SNF once discharged. Patient and her daughter-in-law appear happy with hospital care.  Emotional Assessment Appearance:  Appears stated age Attitude/Demeanor/Rapport:  Engaged, Gracious Affect (typically observed):  Accepting, Appropriate, Calm, Pleasant Orientation:  Oriented to Self, Oriented to Place, Oriented to  Time, Oriented to Situation Alcohol / Substance use:  Never Used Psych involvement (Current and /or in the community):  No (Comment)  Discharge Needs  Concerns to be addressed:  Care Coordination Readmission within the last 30 days:  No Current discharge risk:  None Barriers to Discharge:  Continued Medical Work up    C , LCSW 11/07/2018, 1:22 PM  

## 2018-11-07 NOTE — Evaluation (Signed)
Clinical/Bedside Swallow Evaluation Patient Details  Name: [name] MRN: [id number] Date of Birth: 02-09-1925  Today's Date: 11/07/2018 Time:        Past Medical History:  Past Medical History:  Diagnosis Date  . Anemia   . Arthritis   . Atrial fibrillation ([place])   . Blood clot in vein   . Coronary artery disease   . DVT (deep venous thrombosis) ([place])   . GERD (gastroesophageal reflux disease)   . Hyperlipidemia   . Hypertension   . Mesenteric ischemia   . Stroke [facility])    Past Surgical History:  Past Surgical History:  Procedure Laterality Date  . abdominal aortogram    . ABDOMINAL HYSTERECTOMY  1964  . AMPUTATION Left 11/06/2018   Procedure: AMPUTATION ABOVE KNEE;  Surgeon: Waynetta Sandy, MD;  Location: [place];  Service: Vascular;  Laterality: Left;  . CHOLECYSTECTOMY    . RT AKA    . thrombectomy of superior mesenteric artery     HPI:  Ms.[name] is a 84 y.o. female with PMHx of Alzheimer's, stoke, GERD, CAD, afib/flutter, HTN, CKD IV, and PVD s/p right AKA who presented to [facility] from SNF on 11/07/18 for 2 days of fever and worsening pain in left lower extremity. Found to have Bacteremia 2/2 left leg cellulitis and nonhealing wounds. CXR Right lung base density may represent rounded atelectasis. Pneumonia is not excluded. Per RN note, pt not tolerating thin liquids well.    Assessment / Plan / Recommendation Clinical Impression  Per chart pt experienced difficulty consuming potassium pill and water in ED and coughing with liquids with RN this morning. Daughter reported pt had been scheduled for swallow test in late summer due to vomitting, "food coming back out" but was cancelled after heart complications identified and thought to be cause of GI issues (test ordered was an esophgram). She reported once heart issues stable, swallow seemed to improve. Immediate throat clear x 1 of multiple consecutive sips thin via cup and straw; no overt coughing. However  suspect possible intermittent aspiration as indicated by coughing episodes with RN. Suspect esophageal involvement with history of GERD and eructation noted today with po's. Recommend regular texture diet (with dentures donned), thin liquids, pills whole with water if small, one at at time and if larger pills, whole in applesauce. Educated pt and family re: reflux precautions; remove straw if increased coughing present. Will follow for safety with recommendations.     SLP Visit Diagnosis: Dysphagia, unspecified (R13.10)    Aspiration Risk  Moderate aspiration risk;Mild aspiration risk    Diet Recommendation Regular;Thin liquid   Medication Administration: Whole meds with liquid Supervision: Patient able to self feed;Full supervision/cueing for compensatory strategies Compensations: Slow rate;Small sips/bites    Other  Recommendations Oral Care Recommendations: Oral care BID   Follow up Recommendations None      Frequency and Duration min 2x/week  2 weeks       Prognosis        Swallow Study   General HPI: Ms.[name] is a 84 y.o. female with PMHx of Alzheimer's, stoke, GERD, CAD, afib/flutter, HTN, CKD IV, and PVD s/p right AKA who presented to [facility] from SNF on 11/07/18 for 2 days of fever and worsening pain in left lower extremity. Found to have Bacteremia 2/2 left leg cellulitis and nonhealing wounds. CXR Right lung base density may represent rounded atelectasis. Pneumonia is not excluded. Per RN note, pt not tolerating thin liquids well.  Type of Study: Bedside Swallow Evaluation  Previous Swallow Assessment: none Diet Prior to this Study: NPO Temperature Spikes Noted: Yes Respiratory Status: Room air History of Recent Intubation: No Behavior/Cognition: Alert;Cooperative;Pleasant mood Oral Cavity Assessment: Other (comment)(lingual candidias) Oral Care Completed by SLP: No Oral Cavity - Dentition: Dentures, top;Dentures, bottom Vision: Functional for self-feeding Self-Feeding  Abilities: Needs set up Patient Positioning: Upright in bed Baseline Vocal Quality: Normal Volitional Swallow: Able to elicit    Oral/Motor/Sensory Function Overall Oral Motor/Sensory Function: Within functional limits   Ice Chips Ice chips: Not tested   Thin Liquid Thin Liquid: Impaired Presentation: Cup;Straw Pharyngeal  Phase Impairments: Throat Clearing - Immediate    Nectar Thick Nectar Thick Liquid: Not tested   Honey Thick Honey Thick Liquid: Not tested   Puree Puree: Within functional limits   Solid     Solid: Impaired Oral Phase Impairments: Impaired mastication Oral Phase Functional Implications: Prolonged oral transit      [name] 11/07/2018,3:07 PM  [name] [name].Ed Risk analyst [phone] Office [phone]

## 2018-11-08 DIAGNOSIS — I4891 Unspecified atrial fibrillation: Secondary | ICD-10-CM

## 2018-11-08 DIAGNOSIS — K5900 Constipation, unspecified: Secondary | ICD-10-CM

## 2018-11-08 DIAGNOSIS — Z885 Allergy status to narcotic agent status: Secondary | ICD-10-CM

## 2018-11-08 DIAGNOSIS — Z89612 Acquired absence of left leg above knee: Secondary | ICD-10-CM

## 2018-11-08 DIAGNOSIS — Z880 Allergy status to penicillin: Secondary | ICD-10-CM

## 2018-11-08 LAB — CBC
HCT: 22 % — ABNORMAL LOW (ref 36.0–46.0)
Hemoglobin: 7.4 g/dL — ABNORMAL LOW (ref 12.0–15.0)
MCH: 25.3 pg — ABNORMAL LOW (ref 26.0–34.0)
MCHC: 33.6 g/dL (ref 30.0–36.0)
MCV: 75.3 fL — ABNORMAL LOW (ref 80.0–100.0)
NRBC: 0 % (ref 0.0–0.2)
Platelets: 311 10*3/uL (ref 150–400)
RBC: 2.92 MIL/uL — ABNORMAL LOW (ref 3.87–5.11)
RDW: 17.9 % — ABNORMAL HIGH (ref 11.5–15.5)
WBC: 15.1 10*3/uL — AB (ref 4.0–10.5)

## 2018-11-08 LAB — BASIC METABOLIC PANEL
ANION GAP: 11 (ref 5–15)
BUN: 38 mg/dL — ABNORMAL HIGH (ref 8–23)
CO2: 24 mmol/L (ref 22–32)
Calcium: 7.2 mg/dL — ABNORMAL LOW (ref 8.9–10.3)
Chloride: 101 mmol/L (ref 98–111)
Creatinine, Ser: 1.6 mg/dL — ABNORMAL HIGH (ref 0.44–1.00)
GFR calc Af Amer: 32 mL/min — ABNORMAL LOW (ref >60)
GFR calc non Af Amer: 28 mL/min — ABNORMAL LOW (ref >60)
Glucose, Bld: 138 mg/dL — ABNORMAL HIGH (ref 70–99)
Potassium: 3.3 mmol/L — ABNORMAL LOW (ref 3.5–5.1)
Sodium: 136 mmol/L (ref 135–145)

## 2018-11-08 LAB — CULTURE, BLOOD (ROUTINE X 2)
Special Requests: ADEQUATE
Special Requests: ADEQUATE

## 2018-11-08 LAB — GLUCOSE, CAPILLARY: Glucose-Capillary: 137 mg/dL — ABNORMAL HIGH (ref 70–99)

## 2018-11-08 MED ORDER — SODIUM CHLORIDE 0.9 % IV SOLN
INTRAVENOUS | Status: DC
  Administered 2018-11-08 – 2018-11-09 (×2): via INTRAVENOUS

## 2018-11-08 MED ORDER — POTASSIUM CHLORIDE 20 MEQ/15ML (10%) PO SOLN
40.0000 meq | Freq: Two times a day (BID) | ORAL | Status: AC
  Administered 2018-11-08 (×2): 40 meq via ORAL
  Filled 2018-11-08 (×2): qty 30

## 2018-11-08 NOTE — Progress Notes (Addendum)
     Subjective  - Doing well with mild left stump pain.   Objective 115/78 83 98.2 F (36.8 C) (Oral) (!) 21 99%  Intake/Output Summary (Last 24 hours) at 11/08/2018 0737 Last data filed at 11/08/2018 0650 Gross per 24 hour  Intake 2056.24 ml  Output 800 ml  Net 1256.24 ml    Left stump healing well without ischemic changes, staples intact. Clean dry dressing applied.  Assessment/Planning: POD # 2 left AKA  Skin warm with intact staples.  The staples will be maintained for 4 weeks. I will schedule a 4 week f/u with our office I will call [employer] for retention sock to make dressing changes easier.    [name] 11/08/2018 7:37 AM --  Laboratory Lab Results: Recent Labs    11/07/18 0457 11/08/18 0221  WBC 15.9* 15.1*  HGB 7.9* 7.4*  HCT 24.5* 22.0*  PLT 361 311   BMET Recent Labs    11/07/18 0457 11/08/18 0221  NA 141 136  K 4.0 3.3*  CL 104 101  CO2 24 24  GLUCOSE 57* 138*  BUN 38* 38*  CREATININE 1.76* 1.60*  CALCIUM 7.5* 7.2*    COAG Lab Results  Component Value Date   INR 3.38 02/02/2018   INR 1.15 01/11/2012   INR 1.21 06/13/2011   No results found for: PTT   I have independently interviewed and examined patient and agree with PA assessment and plan above.   Aminta Sakurai C. Donzetta Matters, MD [facility] Office: [phone] Pager: [phone]

## 2018-11-08 NOTE — Plan of Care (Signed)
  Problem: Education: Goal: Knowledge of General Education information will improve Description Including pain rating scale, medication(s)/side effects and non-pharmacologic comfort measures Outcome: Progressing   Problem: Nutrition: Goal: Adequate nutrition will be maintained Outcome: Progressing   Problem: Coping: Goal: Level of anxiety will decrease Outcome: Progressing   Problem: Elimination: Goal: Will not experience complications related to urinary retention Outcome: Progressing   Problem: Pain Managment: Goal: General experience of comfort will improve Outcome: Progressing   Problem: Safety: Goal: Ability to remain free from injury will improve Outcome: Progressing   Problem: Activity: Goal: Risk for activity intolerance will decrease Outcome: Not Progressing   Problem: Elimination: Goal: Will not experience complications related to bowel motility Outcome: Not Progressing

## 2018-11-08 NOTE — Progress Notes (Signed)
   Subjective: No acute events overnight.  Endorsing difficulty with constipation  Objective:  Vital signs in last 24 hours: Vitals:   11/08/18 0526 11/08/18 0700 11/08/18 0900 11/08/18 1000  BP: 115/78     Pulse: 83     Resp: (!) 21 18 (!) 22   Temp: 98.2 F (36.8 C)     TempSrc: Oral     SpO2: 99%     Height:    5\' 4"  (1.626 m)   General: Sitting up in bed, family is at bedside Cardiac: Regular rate and rhythm Pulmonary: CTA B Abdomen: Firm, non-tender, active bowel sounds Extremities: Left AKA dressing is clean and dry  Micro:  BCx 12/2: GPCs, rapid biofire shows MSSA UCx 12/2: no growth, final Repeat BCx 12/4: pending   Antibiotics: Vanco 12/2 one dose Cefepime 12/2 one dose Cefazolin (12/3 - present)  Assessment/Plan:  Principal Problem:   Bacteremia due to methicillin susceptible Staphylococcus aureus (MSSA) Active Problems:   Microcytic anemia   AKI (acute kidney injury) ([place])   CKD (chronic kidney disease), stage IV (HCC)   Cellulitis   Lower extremity ulceration, left, with necrosis of muscle ([place])  84 year old female with Alzheimer's dementia, CAD, A. fib/flutter, hypertension, CKD 4, PVD status post right AKA who presented with 2 days of fever worsening and left leg pain in the setting of nonhealing wounds and leukocytosis.  X-rays negative for osteomyelitis  1.  MSSA bacteremia: Source control with left AKA on December 3.  Afebrile.  Leukocytosis is improving.  Continuing IV cefazolin.  Repeat blood cultures drawn December 4 are pending.  Appreciate IDs assistance -IV cefazolin for 4 weeks -We will place PICC line once blood cultures are negative for 72 hours  2.  Cellulitis, nonhealing wounds: Status post left AKA 12/3.  Dressing is clean and dry and she denies pain.  PT evaluated her yesterday and felt that she did not need any physical therapy since she was not previously receiving PT services at SNF.  It is difficult because of her shoulder  dislocation and bilateral AKA's.  However, I feel some physical therapy could be beneficial at SNF. -Reconsult PT  3.  AKI on CKD 4: Creatinine mildly improved.  Hypovolemic on exam.  P.o. intake has been poor. -Continue IV fluids, switch back to NS since she is no longer npo  4.  Constipation: Senna and MiraLAX without bowel movement.  Patient endorsing abdominal discomfort and the urge to have a bowel movement.  Family at bedside reports use of enemas at SNF. -Soapsuds enema  Afib/flutter:  Irregular heart rhythm on monitor.  Rate controlled.  Continue po amiodarone  HFpEF: hypovolemic. Holding lasix  Anemia of chronic disease: stable hemoglobin 7.4  Dispo: Anticipated discharge in approximately 1 day(s).   Isabelle Course, MD 11/08/2018, 11:47 AM Pager: [phone]

## 2018-11-08 NOTE — Progress Notes (Signed)
PHARMACY CONSULT NOTE FOR:  OUTPATIENT  PARENTERAL ANTIBIOTIC THERAPY (OPAT)  Indication: MSSA bacteremia Regimen:  Cefazolin 2 gm every 12 hours End date:  12/05/18   IV antibiotic discharge orders are pended. To discharging provider:  please sign these orders via discharge navigator,  Select New Orders & click on the button choice - Manage This Unsigned Work.     Thank you for allowing pharmacy to be a part of this patient's care.  Jimmy Footman, PharmD, BCPS, BCIDP Infectious Diseases Clinical Pharmacist Phone: [phone] 11/08/2018, 2:55 PM

## 2018-11-08 NOTE — Progress Notes (Signed)
  Speech Language Pathology Treatment: Dysphagia  Patient Details Name: [name] MRN: [id number] DOB: 07-05-1925 Today's Date: 11/08/2018 Time: 1557-1610 SLP Time Calculation (min) (ACUTE ONLY): 13 min  Assessment / Plan / Recommendation Clinical Impression  Son and daughter-in-law at bedside. Pt more alert, peppier today and demonstrated safe swallow during observation of solid texture snack and thin liquids. Pt/family deny s/s aspiration with meals since assessment. Consecutive sips thin via straw without observable difficulty with effective mastication and transit of cracker. Reiterated swallow precautions for upright position and remain upright minimum of 30 min if able. Continue regular, thin, no further ST needed.    HPI HPI: [name] is a 84 y.o. female with PMHx of Alzheimer's, stoke, GERD, CAD, afib/flutter, HTN, CKD IV, and PVD s/p right AKA who presented to [facility] from SNF on 11/07/18 for 2 days of fever and worsening pain in left lower extremity. Found to have Bacteremia 2/2 left leg cellulitis and nonhealing wounds. CXR Right lung base density may represent rounded atelectasis. Pneumonia is not excluded. Per RN note, pt not tolerating thin liquids well.       SLP Plan  All goals met;Discharge SLP treatment due to (comment)       Recommendations  Diet recommendations: Regular;Thin liquid Liquids provided via: Cup;Straw Medication Administration: Whole meds with liquid Supervision: Patient able to self feed Compensations: Slow rate;Small sips/bites Postural Changes and/or Swallow Maneuvers: Seated upright 90 degrees;Upright 30-60 min after meal                Oral Care Recommendations: Oral care BID SLP Visit Diagnosis: Dysphagia, unspecified (R13.10) Plan: All goals met;Discharge SLP treatment due to (comment)       GO                Houston Siren 11/08/2018, 4:14 PM  Orbie Pyo Colvin Caroli.Ed Risk analyst  [phone] Office [phone]

## 2018-11-08 NOTE — Evaluation (Signed)
Physical Therapy Evaluation and Discharge Patient Details Name: [name] MRN: [id number] DOB: 06-05-1925 Today's Date: 11/08/2018   History of Present Illness  Pt is a 84 y/o female who presented to [facility] from SNF on 11/07/18 for 2 days of fever and worsening pain in left lower extremity. Found to have Bacteremia 2/2 left leg cellulitis and nonhealing wounds. CXR Right lung base density may represent rounded atelectasis. Pneumonia is not excluded. PMH includes dementia, CVA, CAD, a fib, HTN, CKD, and PVD s/p R AKA.   Clinical Impression  Pt is s/p surgery above with deficits below. Pt requiring total A to roll from side to side. Pt unable to assist with UEs at baseline secondary to baseline deficits in [place]. Reviewed AKA HEP with pt's family and had pt perform during session. Also, spent time educating about possible need for new hoyer lift pad at SNF given pt now has bilateral AKAs. Therapists at [facility] may play a roll in determining new equipment needs upon return to SNF. Pt may be able to practice sitting balance in [facility] upon return to SNF with therapy services to ensure pt is safe to continue sitting in [facility] given pt is now bilateral AKA. Per pt's family, pt unable to propel WC or perform mobility tasks using BUE at baseline given BUE deficits. Feel there are no further acute PT needs at this time and therapy team at SNF can determine further skilled PT needs upon d/c. Will sign off. If needs change, please re-consult.     Follow Up Recommendations SNF;Supervision/Assistance - 24 hour    Equipment Recommendations  Other (comment)(new hoyer lift pad for AKA?)    Recommendations for Other Services       Precautions / Restrictions Precautions Precautions: Fall Restrictions Weight Bearing Restrictions: No Other Position/Activity Restrictions: Per family, pt cannot use UEs as she has had shoulder dislocations.       Mobility  Bed Mobility Overal bed mobility: Needs Assistance Bed  Mobility: Rolling Rolling: Total assist         General bed mobility comments: Total A to roll from side to side. Pt unable to use UEs to assist at baseline given deficits. Per family, this is baseline  Transfers                    Ambulation/Gait                Stairs            Wheelchair Mobility    Modified Rankin (Stroke Patients Only)       Balance                                             Pertinent Vitals/Pain Pain Assessment: Faces Faces Pain Scale: Hurts little more Pain Location: L residual limb Pain Descriptors / Indicators: Grimacing;Guarding Pain Intervention(s): Limited activity within patient's tolerance;Monitored during session;Repositioned    Home Living Family/patient expects to be discharged to:: Skilled nursing facility                 Additional Comments: Resident of [employer] [facility] [employer].     Prior Function Level of Independence: Needs assistance   Gait / Transfers Assistance Needed: Per family, pt requires hoyer lift to transfer to [facility]. Unable to propel WC secondary to UE issues.   ADL's / Homemaking Assistance Needed: Total care  for bathing/dressing. Pt's family reports pt able to feed herself.         Hand Dominance        Extremity/Trunk Assessment   Upper Extremity Assessment Upper Extremity Assessment: RUE deficits/detail;LUE deficits/detail RUE Deficits / Details: Unable to use RUE for mobility tasks secondary to history of shoulder dislocation and decreased bone density per family.  LUE Deficits / Details: Unable to use LUE for mobility tasks secondary to history of shoulder dislocation and decreased bone density per family.    Lower Extremity Assessment Lower Extremity Assessment: RLE deficits/detail;LLE deficits/detail RLE Deficits / Details: R AKA at baseline  LLE Deficits / Details: s/p L AKA       Communication   Communication: No difficulties  Cognition Arousal/Alertness:  Awake/alert Behavior During Therapy: WFL for tasks assessed/performed Overall Cognitive Status: History of cognitive impairments - at baseline                                        General Comments General comments (skin integrity, edema, etc.): Pt's family present during session. Educated about possible need for new hoyer lift sling at SNF, and possible need for new equipment at SNF given bilateral AKA. Educated about HEP as well that family can assist pt in performing.     Exercises Amputee Exercises Gluteal Sets: AROM;Both;10 reps;Supine Hip ABduction/ADduction: AAROM;Left;10 reps Hip Flexion/Marching: AAROM;Left;10 reps;Supine   Assessment/Plan    PT Assessment All further PT needs can be met in the next venue of care  PT Problem List Decreased strength;Decreased mobility;Decreased balance;Decreased knowledge of use of DME;Decreased knowledge of precautions       PT Treatment Interventions      PT Goals (Current goals can be found in the Care Plan section)  Acute Rehab PT Goals Patient Stated Goal: to go back to SNF per family.  PT Goal Formulation: With family Time For Goal Achievement: 11/08/18 Potential to Achieve Goals: Fair    Frequency     Barriers to discharge        Co-evaluation               AM-PAC PT "6 Clicks" Mobility  Outcome Measure Help needed turning from your back to your side while in a flat bed without using bedrails?: Total Help needed moving from lying on your back to sitting on the side of a flat bed without using bedrails?: Total Help needed moving to and from a bed to a chair (including a wheelchair)?: Total Help needed standing up from a chair using your arms (e.g., wheelchair or bedside chair)?: Total Help needed to walk in hospital room?: Total Help needed climbing 3-5 steps with a railing? : Total 6 Click Score: 6    End of Session   Activity Tolerance: Patient tolerated treatment well Patient left: in  bed;with call bell/phone within reach;with bed alarm set;with family/visitor present Nurse Communication: Mobility status PT Visit Diagnosis: Other abnormalities of gait and mobility (R26.89);Difficulty in walking, not elsewhere classified (R26.2);Pain Pain - Right/Left: Left Pain - part of body: (residual limb)    Time: 1696-7893 PT Time Calculation (min) (ACUTE ONLY): 25 min   Charges:   PT Evaluation $PT Eval Moderate Complexity: 1 Mod PT Treatments $Therapeutic Activity: 8-22 mins        Leighton Ruff, PT, DPT  Acute Rehabilitation Services  Pager: [phone] Office: [phone]   Rudean Hitt 11/08/2018,  6:22 PM

## 2018-11-08 NOTE — Progress Notes (Signed)
  Date: 11/08/2018  Patient name: [name]  Medical record number: [id number]  Date of birth: 10/07/1925   I have seen and evaluated this patient and I have discussed the plan of care with the house staff. Please see Dr. Cristal Generous note for complete details. I concur with her findings.   Sid Falcon, MD 11/08/2018, 12:55 PM

## 2018-11-08 NOTE — Discharge Summary (Signed)
Name: [name] MRN: [id number] DOB: Aug 05, 1925 84 y.o. PCP: Janifer Adie, MD  Date of Admission: 11/05/2018  6:52 PM Date of Discharge: 11/10/18 Attending Physician: Sid Falcon, MD  Discharge Diagnosis: 1. Left AKA 2. MSSA Bacteremia 2/2 nonhealing wounds 3. AKI 4. Constipation   Discharge Medications: Allergies as of 11/10/2018      Reactions   Morphine And Related Anaphylaxis   Penicillins Other (See Comments), Nausea And Vomiting   Makes patient sick Has patient had a PCN reaction causing immediate rash, facial/tongue/throat swelling, SOB or lightheadedness with hypotension: No Has patient had a PCN reaction causing severe rash involving mucus membranes or skin necrosis: No Has patient had a PCN reaction that required hospitalization: No Has patient had a PCN reaction occurring within the last 10 years: No If all of the above answers are "NO", then may proceed with Cephalosporin use.      Medication List    TAKE these medications   acetaminophen 325 MG tablet Commonly known as:  TYLENOL Take 650 mg by mouth every 6 (six) hours as needed (pain). Max dose 3000 mg/24 hours   amiodarone 100 MG tablet Commonly known as:  PACERONE Take 100 mg by mouth daily.   aspirin 81 MG EC tablet Take 1 tablet (81 mg total) by mouth daily.   bisacodyl 10 MG suppository Commonly known as:  DULCOLAX Place 10 mg rectally daily as needed (constipation not relieved by MOM).   calcium citrate 950 MG tablet Commonly known as:  CALCITRATE - dosed in mg elemental calcium Take 200 mg of elemental calcium by mouth 2 (two) times daily with a meal.   ceFAZolin  IVPB Commonly known as:  ANCEF Inject 2 g into the vein every 12 (twelve) hours for 27 days. Indication:  MSSA bacteremia Last Day of Therapy:  12/05/18 Labs - Once weekly:  CBC/D and BMP, Labs - Every other week:  ESR and CRP   Coenzyme Q10 100 MG capsule Take 200 mg by mouth daily.   Darbepoetin Alfa 60  MCG/0.3ML Sosy injection Commonly known as:  ARANESP Inject 60 mcg into the skin every 14 (fourteen) days. Every other Wednesday   feeding supplement (ENSURE ENLIVE) Liqd Take 237 mLs by mouth daily at 2 PM.   feeding supplement (PRO-STAT SUGAR FREE 64) Liqd Take 30 mLs by mouth 2 (two) times daily with a meal.   FLEET ENEMA RE Place 1 enema rectally daily as needed (constipation not relieved by Bisacodyl suppository).   furosemide 40 MG tablet Commonly known as:  LASIX Take 40 mg by mouth daily.   levothyroxine 88 MCG tablet Commonly known as:  SYNTHROID, LEVOTHROID Take 88 mcg by mouth daily before breakfast.   lubiprostone 24 MCG capsule Commonly known as:  AMITIZA Take 1 capsule (24 mcg total) by mouth 2 (two) times daily with a meal.   MILK OF MAGNESIA PO Take 30 mLs by mouth daily as needed (constipation).   ondansetron 4 MG tablet Commonly known as:  ZOFRAN Take 4 mg by mouth every 8 (eight) hours as needed for nausea.   oxyCODONE-acetaminophen 5-325 MG tablet Commonly known as:  PERCOCET/ROXICET Take by mouth every 6 (six) hours as needed (left leg pain).   pantoprazole 20 MG tablet Commonly known as:  PROTONIX Take 20 mg by mouth daily before breakfast.   pravastatin 40 MG tablet Commonly known as:  PRAVACHOL Take 40 mg by mouth at bedtime.   sennosides-docusate sodium 8.6-50 MG tablet Commonly known  as:  SENOKOT-S Take 2 tablets by mouth 2 (two) times daily.   traMADol 50 MG tablet Commonly known as:  ULTRAM Take 1 tablet (50 mg total) by mouth every 12 (twelve) hours as needed for severe pain.            Home Infusion Instuctions  (From admission, onward)         Start     Ordered   11/10/18 0000  Home infusion instructions [facility] May follow [place] Dosing Protocol; May administer Cathflo as needed to maintain patency of vascular access device.; Flushing of vascular access device: per [facility] Protocol: 0.9% NaCl pre/post medica...     Question Answer Comment  Instructions May follow [place] Dosing Protocol   Instructions May administer Cathflo as needed to maintain patency of vascular access device.   Instructions Flushing of vascular access device: per [facility] Protocol: 0.9% NaCl pre/post medication administration and prn patency; Heparin 100 u/ml, 39m for implanted ports and Heparin 10u/ml, 538mfor all other central venous catheters.   Instructions May follow [facility] Anaphylaxis Protocol for First Dose Administration in the home: 0.9% NaCl at 25-50 ml/hr to maintain IV access for protocol meds. Epinephrine 0.3 ml IV/IM PRN and Benadryl 25-50 IV/IM PRN s/s of anaphylaxis.   Instructions [facility] Infusion Coordinator (RN) to assist per patient IV care needs in the home PRN.      11/10/18 1229          Disposition and follow-up:   [name]  At the hospital follow up visit please address:  1.  Constipation: added scheduled lubiprostone and prn disacodyl if no BM for 3 days MSSA bacteremia: please complete 4 week course of cefazolin via PICC line Left AKA: f/u with vascular surgery in 4 weeks for staple removal. See PT recs below   *Please assess need for DVT ppx given reduced mobility with bilateral AKAs and unilateral shoulder dislocation.   2.  Labs / imaging needed at time of follow-up: none  3.  Pending labs/ test needing follow-up: none  Follow-up Appointments:  Contact information for follow-up providers    CaWaynetta SandyMD Follow up in 4 week(s).   Specialties:  Vascular Surgery, Cardiology Contact information: [id number] [place] [phone]          Contact information for after-discharge care    Destination    [facility] Preferred SNF .   Service:  Skilled Nursing Contact information: [address] problem list: [id number] old female with Alzheimer's dementia, CAD, A. fib/flutter, hypertension, CKD 4, PVD status post right AKA who presented with 2 days of fever worsening and left leg pain in the setting of nonhealing wounds and leukocytosis. X-rays negative for osteomyelitis  1. MSSA Bacteremia: 2/2 non healing wounds on left lower extremity. Hemodynamically stable. Source control on 12/3 after left above knee amputation. Discharged with PICC line to complete a 4 week course of IV cefazolin. Last negative blood cultures were drawn 11/07/18.   2. Left AKA: surgery on Dec 3rd without complication. Healing appropriately. Has f/u scheduled with vascular surgery outpatient    PT recs: Possible need for new hoyer lift pad. [facility]  needs upon return to SNF. Pt may be able to  practice sitting balance in [facility] upon return to SNF with therapy services to  ensure pt is safe to continue sitting in [facility] given pt is now bilateral AKA.   3. AKI: improved with IVFs, creatine at discharge was 1.6.   4. Constipation: Persisted despite Senna and miralax, required disimpaction. Added scheduled lubiprostone given chronic opiate use. If no BM for 3 days, give disacodyl.    5. Anemia of chronic disease: Hemoglobin dropped to 6.7 on admission and she was given 1 unit PRBCs. Minimal blood loss during surgery and hemoglobin remained stable around 7.4  Afib/flutter: managed without complication on home amiodarone HFpEF: hypovolemic so lasix was held during admission. Resumed by discharge   Discharge Vitals:   BP 130/70 (BP Location: Right Arm)   Pulse 99   Temp 98.9 F (37.2 C) (Oral)   Resp (!) 21   Ht '5\' 4"'  (1.626 m) Comment: prior to amputations  LMP 04/20/2018 (Exact Date) Comment: reviewed and question the date of LMP.   SpO2 98%   BMI 21.80 kg/m   Pertinent Labs, Studies, and Procedures:   Results for orders  placed or performed during the hospital encounter of 11/05/18  Blood Culture (routine x 2)     Status: Abnormal   Collection Time: 11/05/18  7:05 PM  Result Value Ref Range Status   Specimen Description BLOOD RIGHT ANTECUBITAL  Final   Special Requests   Final    BOTTLES DRAWN AEROBIC AND ANAEROBIC Blood Culture adequate volume   Culture  Setup Time   Final    GRAM POSITIVE COCCI IN BOTH AEROBIC AND ANAEROBIC BOTTLES CRITICAL VALUE NOTED.  VALUE IS CONSISTENT WITH PREVIOUSLY REPORTED AND CALLED VALUE.    Culture (A)  Final    STAPHYLOCOCCUS AUREUS SUSCEPTIBILITIES PERFORMED ON PREVIOUS CULTURE WITHIN THE LAST 5 DAYS. Performed at [facility], [place] [address] [place], [place] [address]    Report Status 11/08/2018 FINAL  Final  Blood Culture (routine x 2)     Status: Abnormal   Collection Time: 11/05/18  7:30 PM  Result Value Ref Range Status   Specimen Description BLOOD RIGHT ARM UPPER  Final   Special Requests   Final    BOTTLES DRAWN AEROBIC AND ANAEROBIC Blood Culture adequate volume   Culture  Setup Time   Final    GRAM POSITIVE COCCI IN BOTH AEROBIC AND ANAEROBIC BOTTLES CRITICAL RESULT CALLED TO, READ BACK BY AND VERIFIED WITH: PHARMD M [place] 11/06/18 AT 745 AM B Y CM Performed at [facility], [place] [address] [place], [place] [address]    Culture STAPHYLOCOCCUS AUREUS (A)  Final   Report Status 11/08/2018 FINAL  Final   Organism ID, Bacteria STAPHYLOCOCCUS AUREUS  Final      Susceptibility   Staphylococcus aureus - MIC*    CIPROFLOXACIN >=8 RESISTANT Resistant     ERYTHROMYCIN >=8 RESISTANT Resistant     GENTAMICIN <=0.5 SENSITIVE Sensitive     OXACILLIN 0.5 SENSITIVE Sensitive     TETRACYCLINE <=1 SENSITIVE Sensitive     VANCOMYCIN <=0.5 SENSITIVE Sensitive     TRIMETH/SULFA <=10 SENSITIVE Sensitive     CLINDAMYCIN <=0.25 SENSITIVE Sensitive     RIFAMPIN <=0.5 SENSITIVE Sensitive     Inducible Clindamycin NEGATIVE Sensitive     * STAPHYLOCOCCUS AUREUS   Blood Culture ID Panel (Reflexed)     Status: Abnormal   Collection Time: 11/05/18  7:30 PM  Result Value Ref Range Status   Enterococcus species NOT DETECTED NOT DETECTED Final   Listeria monocytogenes NOT DETECTED NOT DETECTED Final   Staphylococcus species DETECTED (A) NOT DETECTED Final    Comment: CRITICAL RESULT CALLED TO, READ BACK BY AND VERIFIED WITH: PHARMD M [place] 11/06/18 AT 745 AM BY CM    Staphylococcus aureus (BCID) DETECTED (A) NOT DETECTED Final    Comment: Methicillin (oxacillin) susceptible Staphylococcus aureus (MSSA). Preferred therapy is anti staphylococcal beta lactam antibiotic (Cefazolin or Nafcillin), unless clinically contraindicated. CRITICAL RESULT CALLED TO, READ BACK BY AND VERIFIED WITH: PHARMD M [place] 11/06/18 AT 745 AM BY CM    Methicillin resistance NOT DETECTED NOT DETECTED Final   Streptococcus species NOT DETECTED NOT DETECTED Final   Streptococcus agalactiae NOT DETECTED NOT DETECTED Final   Streptococcus pneumoniae NOT DETECTED NOT DETECTED Final   Streptococcus pyogenes NOT DETECTED NOT DETECTED Final   Acinetobacter baumannii NOT DETECTED NOT DETECTED Final   Enterobacteriaceae species NOT DETECTED NOT DETECTED Final   Enterobacter cloacae complex NOT DETECTED NOT DETECTED Final   Escherichia coli NOT DETECTED NOT DETECTED Final   Klebsiella oxytoca NOT DETECTED NOT DETECTED Final   Klebsiella pneumoniae NOT DETECTED NOT DETECTED Final   Proteus species NOT DETECTED NOT DETECTED Final   Serratia marcescens NOT DETECTED NOT DETECTED Final   Haemophilus influenzae NOT DETECTED NOT DETECTED Final   Neisseria meningitidis NOT DETECTED NOT DETECTED Final   Pseudomonas aeruginosa NOT DETECTED NOT DETECTED Final   Candida albicans NOT DETECTED NOT DETECTED Final   Candida glabrata NOT DETECTED NOT DETECTED Final   Candida krusei NOT DETECTED NOT DETECTED Final   Candida parapsilosis NOT DETECTED NOT DETECTED Final   Candida tropicalis NOT  DETECTED NOT DETECTED Final    Comment: Performed at [facility], [place]. [address], [place], [place] [address]  Urine culture     Status: None   Collection Time: 11/05/18  7:50 PM  Result Value Ref Range Status   Specimen Description URINE, CATHETERIZED  Final   Special Requests NONE  Final   Culture   Final    NO GROWTH Performed at [facility], [address], [place], [place] [address]    Report Status 11/07/2018 FINAL  Final  Culture, blood (routine x 2)     Status: None (Preliminary result)   Collection Time: 11/07/18  4:57 AM  Result Value Ref Range Status   Specimen Description BLOOD LEFT ARM  Final   Special Requests   Final    BOTTLES DRAWN AEROBIC ONLY Blood Culture results may not be optimal due to an inadequate volume of blood received in culture bottles   Culture   Final    NO GROWTH 2 DAYS Performed at [facility], [place] [address] [place], [place] [address]    Report Status PENDING  Incomplete  Culture, blood (routine x 2)     Status: None (Preliminary result)   Collection Time: 11/07/18  5:06 AM  Result Value Ref Range Status   Specimen Description BLOOD RIGHT HAND  Final   Special Requests   Final    BOTTLES DRAWN AEROBIC ONLY Blood Culture adequate volume   Culture   Final    NO GROWTH 2 DAYS Performed at [facility], [place] [address], [place], [place] [address]    Report Status PENDING  Incomplete     Discharge Instructions: Discharge Instructions    Diet - low sodium heart healthy  Complete by:  As directed    Discharge instructions   Complete by:  As directed    [name],   You were admitted to the hospital because you had an infection in your blood that was coming from the wounds on your left leg. We were able to control the infection with surgery and are treating you with antibiotics. You will be receiving antibiotics through your PICC line for the next 4 weeks. We also started you on another medicine to help you have  regular bowel movements. If you have increased pain, difficulty breathing, or fevers please come back to the hospital.   Home infusion instructions [place] May follow [place] Dosing Protocol; May administer Cathflo as needed to maintain patency of vascular access device.; Flushing of vascular access device: per [facility] Protocol: 0.9% NaCl pre/post medica...   Complete by:  As directed    Instructions:  May follow [place] Dosing Protocol   Instructions:  May administer Cathflo as needed to maintain patency of vascular access device.   Instructions:  Flushing of vascular access device: per [facility] Protocol: 0.9% NaCl pre/post medication administration and prn patency; Heparin 100 u/ml, 97m for implanted ports and Heparin 10u/ml, 522mfor all other central venous catheters.   Instructions:  May follow [facility] Anaphylaxis Protocol for First Dose Administration in the home: 0.9% NaCl at 25-50 ml/hr to maintain IV access for protocol meds. Epinephrine 0.3 ml IV/IM PRN and Benadryl 25-50 IV/IM PRN s/s of anaphylaxis.   Instructions:  [facility] Coord[facility] (RN) to assist per patient IV care needs in the home PRN.   Increase activity slowly   Complete by:  As directed       Signed: ChLars MageMD 11/10/2018, 12:29 PM   Pager: [phone]

## 2018-11-08 NOTE — Progress Notes (Signed)
[facility] for Infectious Disease  Date of Admission:  11/05/2018   Total days of antibiotics 3        Day 2 cefazolin            Patient ID: [name] is a 84 y.o. F with  Principal Problem:   Bacteremia due to methicillin susceptible Staphylococcus aureus (MSSA) Active Problems:   Cellulitis   Lower extremity ulceration, left, with necrosis of muscle (HCC)   Microcytic anemia   AKI (acute kidney injury) ([place])   CKD (chronic kidney disease), stage IV ([place])   . acetaminophen  650 mg Oral Once  . amiodarone  100 mg Oral Daily  . aspirin EC  81 mg Oral Daily  . calcium citrate  200 mg of elemental calcium Oral BID WC  . Darbepoetin Alfa  60 mcg Subcutaneous Q14 Days  . enoxaparin (LOVENOX) injection  30 mg Subcutaneous Q24H  . feeding supplement (ENSURE ENLIVE)  237 mL Oral Q1400  . feeding supplement (PRO-STAT SUGAR FREE 64)  30 mL Oral BID WC  . Gerhardt's butt cream   Topical BID  . levothyroxine  88 mcg Oral QAC breakfast  . pantoprazole  20 mg Oral QAC breakfast  . polyethylene glycol  17 g Oral Daily  . potassium chloride  40 mEq Oral BID  . pravastatin  40 mg Oral QHS  . senna-docusate  2 tablet Oral BID  . sodium chloride flush  3 mL Intravenous Q12H    SUBJECTIVE: With mild/moderate pain of the amputation leg. Uncomfortable due to constipation. WBC 15.1K. Temp now < 99.   Allergies  Allergen Reactions  . Morphine And Related Anaphylaxis  . Penicillins Other (See Comments) and Nausea And Vomiting    Makes patient sick Has patient had a PCN reaction causing immediate rash, facial/tongue/throat swelling, SOB or lightheadedness with hypotension: No Has patient had a PCN reaction causing severe rash involving mucus membranes or skin necrosis: No Has patient had a PCN reaction that required hospitalization: No Has patient had a PCN reaction occurring within the last 10 years: No If all of the above answers are "NO", then may proceed with  Cephalosporin use.     OBJECTIVE: Vitals:   11/07/18 1944 11/08/18 0526 11/08/18 0700 11/08/18 0900  BP: (!) 111/59 115/78    Pulse: 85 83    Resp: (!) 23 (!) 21 18 (!) 22  Temp: 99.8 F (37.7 C) 98.2 F (36.8 C)    TempSrc: Oral Oral    SpO2:  99%     There is no height or weight on file to calculate BMI.  Physical Exam  Constitutional: She is oriented to person, place, and time. She appears well-developed.  Sitting up in bed eating breakfast.   HENT:  Mouth/Throat: Oropharynx is clear and moist and mucous membranes are normal. No oral lesions. Normal dentition. No dental abscesses.  Eyes: Pupils are equal, round, and reactive to light. No scleral icterus.  Cardiovascular: Normal rate and normal heart sounds.  No murmur heard. afib on tele  Pulmonary/Chest: Effort normal and breath sounds normal.  Abdominal: Soft.  Musculoskeletal:  L AKA site dressed.  Neurological: She is alert and oriented to person, place, and time.  Skin: Skin is warm and dry. No rash noted.  Psychiatric: She has a normal mood and affect. Judgment normal.  Vitals reviewed.   Lab Results Lab Results  Component Value Date   WBC 15.1 (H) 11/08/2018  HGB 7.4 (L) 11/08/2018   HCT 22.0 (L) 11/08/2018   MCV 75.3 (L) 11/08/2018   PLT 311 11/08/2018    Lab Results  Component Value Date   CREATININE 1.60 (H) 11/08/2018   BUN 38 (H) 11/08/2018   NA 136 11/08/2018   K 3.3 (L) 11/08/2018   CL 101 11/08/2018   CO2 24 11/08/2018    Lab Results  Component Value Date   ALT 52 (H) 11/06/2018   AST 112 (H) 11/06/2018   ALKPHOS 101 11/06/2018   BILITOT 1.1 11/06/2018     Microbiology: Recent Results (from the past 240 hour(s))  Blood Culture (routine x 2)     Status: Abnormal   Collection Time: 11/05/18  7:05 PM  Result Value Ref Range Status   Specimen Description BLOOD RIGHT ANTECUBITAL  Final   Special Requests   Final    BOTTLES DRAWN AEROBIC AND ANAEROBIC Blood Culture adequate volume    Culture  Setup Time   Final    GRAM POSITIVE COCCI IN BOTH AEROBIC AND ANAEROBIC BOTTLES CRITICAL VALUE NOTED.  VALUE IS CONSISTENT WITH PREVIOUSLY REPORTED AND CALLED VALUE.    Culture (A)  Final    STAPHYLOCOCCUS AUREUS SUSCEPTIBILITIES PERFORMED ON PREVIOUS CULTURE WITHIN THE LAST 5 DAYS. Performed at [facility], [place] [address], [place], [place] [address]    Report Status 11/08/2018 FINAL  Final  Blood Culture (routine x 2)     Status: Abnormal   Collection Time: 11/05/18  7:30 PM  Result Value Ref Range Status   Specimen Description BLOOD RIGHT ARM UPPER  Final   Special Requests   Final    BOTTLES DRAWN AEROBIC AND ANAEROBIC Blood Culture adequate volume   Culture  Setup Time   Final    GRAM POSITIVE COCCI IN BOTH AEROBIC AND ANAEROBIC BOTTLES CRITICAL RESULT CALLED TO, READ BACK BY AND VERIFIED WITH: PHARMD M [place] 11/06/18 AT 745 AM B Y CM Performed at [facility], [place] [address] [place], [place] [address]    Culture STAPHYLOCOCCUS AUREUS (A)  Final   Report Status 11/08/2018 FINAL  Final   Organism ID, Bacteria STAPHYLOCOCCUS AUREUS  Final      Susceptibility   Staphylococcus aureus - MIC*    CIPROFLOXACIN >=8 RESISTANT Resistant     ERYTHROMYCIN >=8 RESISTANT Resistant     GENTAMICIN <=0.5 SENSITIVE Sensitive     OXACILLIN 0.5 SENSITIVE Sensitive     TETRACYCLINE <=1 SENSITIVE Sensitive     VANCOMYCIN <=0.5 SENSITIVE Sensitive     TRIMETH/SULFA <=10 SENSITIVE Sensitive     CLINDAMYCIN <=0.25 SENSITIVE Sensitive     RIFAMPIN <=0.5 SENSITIVE Sensitive     Inducible Clindamycin NEGATIVE Sensitive     * STAPHYLOCOCCUS AUREUS  Blood Culture ID Panel (Reflexed)     Status: Abnormal   Collection Time: 11/05/18  7:30 PM  Result Value Ref Range Status   Enterococcus species NOT DETECTED NOT DETECTED Final   Listeria monocytogenes NOT DETECTED NOT DETECTED Final   Staphylococcus species DETECTED (A) NOT DETECTED Final    Comment: CRITICAL RESULT CALLED TO,  READ BACK BY AND VERIFIED WITH: PHARMD M MACC[place] 11/06/18 AT 745 AM BY CM    Staphylococcus aureus (BCID) DETECTED (A) NOT DETECTED Final    Comment: Methicillin (oxacillin) susceptible Staphylococcus aureus (MSSA). Preferred therapy is anti staphylococcal beta lactam antibiotic (Cefazolin or Nafcillin), unless clinically contraindicated. CRITICAL RESULT CALLED TO, READ BACK BY AND VERIFIED WITH: PHARMD M [place] 11/06/18 AT 745  AM BY CM    Methicillin resistance NOT DETECTED NOT DETECTED Final   Streptococcus species NOT DETECTED NOT DETECTED Final   Streptococcus agalactiae NOT DETECTED NOT DETECTED Final   Streptococcus pneumoniae NOT DETECTED NOT DETECTED Final   Streptococcus pyogenes NOT DETECTED NOT DETECTED Final   Acinetobacter baumannii NOT DETECTED NOT DETECTED Final   Enterobacteriaceae species NOT DETECTED NOT DETECTED Final   Enterobacter cloacae complex NOT DETECTED NOT DETECTED Final   Escherichia coli NOT DETECTED NOT DETECTED Final   Klebsiella oxytoca NOT DETECTED NOT DETECTED Final   Klebsiella pneumoniae NOT DETECTED NOT DETECTED Final   Proteus species NOT DETECTED NOT DETECTED Final   Serratia marcescens NOT DETECTED NOT DETECTED Final   Haemophilus influenzae NOT DETECTED NOT DETECTED Final   Neisseria meningitidis NOT DETECTED NOT DETECTED Final   Pseudomonas aeruginosa NOT DETECTED NOT DETECTED Final   Candida albicans NOT DETECTED NOT DETECTED Final   Candida glabrata NOT DETECTED NOT DETECTED Final   Candida krusei NOT DETECTED NOT DETECTED Final   Candida parapsilosis NOT DETECTED NOT DETECTED Final   Candida tropicalis NOT DETECTED NOT DETECTED Final    Comment: Performed at [facility], [place] [address] [place], [place] [address]  Urine culture     Status: None   Collection Time: 11/05/18  7:50 PM  Result Value Ref Range Status   Specimen Description URINE, CATHETERIZED  Final   Special Requests NONE  Final   Culture   Final    NO  GROWTH Performed at [facility], [address] [place], [place] [address]    Report Status 11/07/2018 FINAL  Final   ASSESSMENT & PLAN:  1. MSSA Bacteremia = TTE done yesterday without confirmation of vegetations; some calcified valves. Would consider 4 week treatment with IV cefazolin as we will not be pursuing TEE in this patient. Hold on PICC line until blood cultures from 12/04 are 72 hours without growth.   2. S/P L AKA d/t Chronic Necrotic Wounds = POD 2.  Vascular note reviewed indicating expected healing process w/o concern for infection.   3. Medication Management = tolerating cefazolin well w/o concern for side effect.   4. Constipation = appreciate primary team;s assistance. She tells me she has had trouble with this for a few weeks prior to hospital admission.   Janene Madeira, MSN, NP-C [facility] [place] Cell: [phone] Pager: [phone]  11/08/2018  9:38 AM

## 2018-11-09 ENCOUNTER — Telehealth: Payer: Self-pay | Admitting: Vascular Surgery

## 2018-11-09 LAB — BASIC METABOLIC PANEL
Anion gap: 10 (ref 5–15)
BUN: 42 mg/dL — ABNORMAL HIGH (ref 8–23)
CO2: 23 mmol/L (ref 22–32)
Calcium: 7.2 mg/dL — ABNORMAL LOW (ref 8.9–10.3)
Chloride: 105 mmol/L (ref 98–111)
Creatinine, Ser: 1.6 mg/dL — ABNORMAL HIGH (ref 0.44–1.00)
GFR calc non Af Amer: 28 mL/min — ABNORMAL LOW (ref >60)
GFR, EST AFRICAN AMERICAN: 32 mL/min — AB (ref >60)
Glucose, Bld: 109 mg/dL — ABNORMAL HIGH (ref 70–99)
Potassium: 4.7 mmol/L (ref 3.5–5.1)
Sodium: 138 mmol/L (ref 135–145)

## 2018-11-09 LAB — GLUCOSE, CAPILLARY: Glucose-Capillary: 106 mg/dL — ABNORMAL HIGH (ref 70–99)

## 2018-11-09 MED ORDER — LUBIPROSTONE 24 MCG PO CAPS
24.0000 ug | ORAL_CAPSULE | Freq: Two times a day (BID) | ORAL | Status: DC
  Administered 2018-11-10: 24 ug via ORAL
  Filled 2018-11-09 (×3): qty 1

## 2018-11-09 NOTE — Progress Notes (Signed)
Pharmacy Antibiotic Note  [name] is a 84 y.o. female admitted on 11/05/2018 with cellulitis found to have MSSA bacteremia. S/p L AKA 12/3.  Pharmacy has been consulted for cefazolin dosing. Pt with known CKD stage IV.  Plan: Continue Cefazolin 2 g IV q12h - plan for 4 weeks total (ends 12/05/18) Monitor renal function and clinical progress  OPAT abx orders are pended   Height: 5\' 4"  (162.6 cm)(prior to amputations) IBW/kg (Calculated) : 54.7  Temp (24hrs), Avg:99 F (37.2 C), Min:98.6 F (37 C), Max:99.6 F (37.6 C)  Recent Labs  Lab 11/05/18 1859 11/05/18 1914 11/05/18 2308 11/06/18 0227 11/06/18 1341 11/07/18 0457 11/08/18 0221 11/09/18 0337  WBC 17.7*  --   --  19.3* 17.3* 15.9* 15.1*  --   CREATININE 1.79*  --   --  1.73*  --  1.76* 1.60* 1.60*  LATICACIDVEN  --  1.42 1.96*  --   --   --   --   --     CrCl cannot be calculated (Unknown ideal weight.).    Allergies  Allergen Reactions  . Morphine And Related Anaphylaxis  . Penicillins Other (See Comments) and Nausea And Vomiting    Makes patient sick Has patient had a PCN reaction causing immediate rash, facial/tongue/throat swelling, SOB or lightheadedness with hypotension: No Has patient had a PCN reaction causing severe rash involving mucus membranes or skin necrosis: No Has patient had a PCN reaction that required hospitalization: No Has patient had a PCN reaction occurring within the last 10 years: No If all of the above answers are "NO", then may proceed with Cephalosporin use.     Antimicrobials this admission: Vanc 12/2 >> 12/3 Cefepime 12/2 >> 12/3 Cefazolin 12/3 >>   Thank you for allowing pharmacy to be a part of this patient's care.  Sherlon Handing, PharmD, BCPS Clinical pharmacist  **Pharmacist phone directory can now be found on [email or web address] (PW [id number]).  Listed under [facility]. 11/09/2018 11:27 AM

## 2018-11-09 NOTE — Telephone Encounter (Signed)
sch appt spk to pt d[facility] pace 12/07/2018 3pm staple removal

## 2018-11-09 NOTE — Progress Notes (Signed)
[place] has been advised of anticipated discharge of patient tomorrow.   Thurmond Butts, [place] Social Worker [phone]

## 2018-11-09 NOTE — Progress Notes (Signed)
   Subjective: No complaints. Disimpacted yesterday and abdomen is feeling much better. Po intake has improved.   Objective:  Vital signs in last 24 hours: Vitals:   11/09/18 0500 11/09/18 0611 11/09/18 0700 11/09/18 0900  BP:  119/90    Pulse: (!) 51 94 88 (!) 107  Resp: (!) 27 17 (!) 23   Temp:  98.7 F (37.1 C)    TempSrc:  Tympanic    SpO2: 97% (!) 59% 94% 90%  Height:       General: Sitting up in bed, son is at bedside  Cardiac: irregular  Pulmonary: CTA B Abdomen: soft, non-tender, active bowel sounds Extremities: Left AKA dressing is clean and dry  Micro:  BCx 12/2: GPCs, rapid biofire shows MSSA UCx 12/2: no growth, final Repeat BCx 12/4: NG at 48hrs   Antibiotics: Vanco 12/2 one dose Cefepime 12/2 one dose Cefazolin (12/3 - present)  Assessment/Plan:  Principal Problem:   Bacteremia due to methicillin susceptible Staphylococcus aureus (MSSA) Active Problems:   Microcytic anemia   AKI (acute kidney injury) ([facility])   CKD (chronic kidney disease), stage IV ([facility])   Cellulitis   Lower extremity ulceration, left, with necrosis of muscle ([place])  84 year old female with Alzheimer's dementia, CAD, A. fib/flutter, hypertension, CKD 4, PVD status post right AKA who presented with 2 days of fever worsening and left leg pain in the setting of nonhealing wounds and leukocytosis.  X-rays negative for osteomyelitis  1.  MSSA bacteremia: Source control with left AKA on December 3.  Afebrile.    Continuing IV cefazolin. BCx no growth at 48h -If BCx remain negative tomorrow, can place PICC line and discharge back to SNF with 4 weeks IV cefazolin   2.  Cellulitis, nonhealing wounds: Status post left AKA 12/3.  Dressing is clean and dry and she denies pain. PT gave recs for new hoyer lift pad   3.  AKI on CKD 4: Creatinine mildly improving - po intake has improved  4.  Constipation: Disimpacted yesterday. Continue scheduled miralax and senna. Add scheduled lubiprostone. If  does not have BM in 3 days, do prn disacodyl.     Dispo: Anticipated discharge tomorrow after PICC line placement   Isabelle Course, MD 11/09/2018, 10:50 AM Pager: [phone]

## 2018-11-09 NOTE — Progress Notes (Signed)
  Date: 11/09/2018  Patient name: [name]  Medical record number: [id number]  Date of birth: [birth year]   I have seen and evaluated this patient and I have discussed the plan of care with the house staff. Please see Dr. Cristal Generous note for complete details. I concur with her findings.    Sid Falcon, MD 11/09/2018, 1:24 PM

## 2018-11-09 NOTE — Telephone Encounter (Signed)
-----   Message from Ulyses Amor, [place] sent at 11/08/2018  7:46 AM EST -----  S/P left AKA needs f/u in [facility] clinic on a Friday for stump check and staple removal.

## 2018-11-09 NOTE — Care Management Important Message (Signed)
Important Message  Patient Details  Name: [name] MRN: [id number] Date of Birth: [birth year]   Medicare Important Message Given:  Yes    Weston Kallman P [place] 11/09/2018, 1:03 PM

## 2018-11-09 NOTE — Progress Notes (Signed)
     Subjective  - patient comfortable no new complaints.   Objective 119/90 94 98.7 F (37.1 C) (Tympanic) 17 (!) 59%  Intake/Output Summary (Last 24 hours) at 11/09/2018 0723 Last data filed at 11/09/2018 0420 Gross per 24 hour  Intake 2280.31 ml  Output -  Net 2280.31 ml    Left stump dressing clean and dry    Assessment/Planning: POD # 2 left AKA  Biotech retention sock can be applied today when patient awake.  If no drainage on dressing sock only is fine.   Left stump appears viable. OK for discharge when medically stable form a vascular point of view.  Roxy Horseman 11/09/2018 7:23 AM --  Laboratory Lab Results: Recent Labs    11/07/18 0457 11/08/18 0221  WBC 15.9* 15.1*  HGB 7.9* 7.4*  HCT 24.5* 22.0*  PLT 361 311   BMET Recent Labs    11/08/18 0221 11/09/18 0337  NA 136 138  K 3.3* 4.7  CL 101 105  CO2 24 23  GLUCOSE 138* 109*  BUN 38* 42*  CREATININE 1.60* 1.60*  CALCIUM 7.2* 7.2*    COAG Lab Results  Component Value Date   INR 3.38 02/02/2018   INR 1.15 01/11/2012   INR 1.21 06/13/2011   No results found for: PTT   I have independently interviewed and examined patient and agree with PA assessment and plan above.  We will follow-up for staple removal in a few weeks.  Please call if there are issues prior to discharge  [place]. Donzetta Matters, MD [facility] Office: [phone] Pager: [phone]

## 2018-11-10 ENCOUNTER — Inpatient Hospital Stay (HOSPITAL_COMMUNITY): Payer: Medicare (Managed Care)

## 2018-11-10 ENCOUNTER — Inpatient Hospital Stay: Payer: Self-pay

## 2018-11-10 LAB — BASIC METABOLIC PANEL
Anion gap: 8 (ref 5–15)
Anion gap: 12 (ref 5–15)
BUN: 44 mg/dL — ABNORMAL HIGH (ref 8–23)
BUN: 45 mg/dL — ABNORMAL HIGH (ref 8–23)
CHLORIDE: 107 mmol/L (ref 98–111)
CO2: 23 mmol/L (ref 22–32)
CO2: 21 mmol/L — AB (ref 22–32)
CREATININE: 1.75 mg/dL — AB (ref 0.44–1.00)
Calcium: 7.4 mg/dL — ABNORMAL LOW (ref 8.9–10.3)
Calcium: 7.6 mg/dL — ABNORMAL LOW (ref 8.9–10.3)
Chloride: 106 mmol/L (ref 98–111)
Creatinine, Ser: 1.86 mg/dL — ABNORMAL HIGH (ref 0.44–1.00)
GFR calc Af Amer: 29 mL/min — ABNORMAL LOW (ref >60)
GFR calc Af Amer: 27 mL/min — ABNORMAL LOW (ref >60)
GFR calc non Af Amer: 25 mL/min — ABNORMAL LOW (ref >60)
GFR calc non Af Amer: 23 mL/min — ABNORMAL LOW (ref >60)
Glucose, Bld: 107 mg/dL — ABNORMAL HIGH (ref 70–99)
Glucose, Bld: 103 mg/dL — ABNORMAL HIGH (ref 70–99)
POTASSIUM: 4.2 mmol/L (ref 3.5–5.1)
Potassium: 6.1 mmol/L — ABNORMAL HIGH (ref 3.5–5.1)
Sodium: 137 mmol/L (ref 135–145)
Sodium: 140 mmol/L (ref 135–145)

## 2018-11-10 LAB — CBC
HCT: 25 % — ABNORMAL LOW (ref 36.0–46.0)
Hemoglobin: 8.3 g/dL — ABNORMAL LOW (ref 12.0–15.0)
MCH: 25.5 pg — ABNORMAL LOW (ref 26.0–34.0)
MCHC: 33.2 g/dL (ref 30.0–36.0)
MCV: 76.9 fL — ABNORMAL LOW (ref 80.0–100.0)
PLATELETS: 385 10*3/uL (ref 150–400)
RBC: 3.25 MIL/uL — ABNORMAL LOW (ref 3.87–5.11)
RDW: 19.9 % — AB (ref 11.5–15.5)
WBC: 16.7 10*3/uL — ABNORMAL HIGH (ref 4.0–10.5)
nRBC: 0.2 % (ref 0.0–0.2)

## 2018-11-10 LAB — GLUCOSE, CAPILLARY: Glucose-Capillary: 120 mg/dL — ABNORMAL HIGH (ref 70–99)

## 2018-11-10 MED ORDER — SODIUM CHLORIDE 0.9% FLUSH: 10.0000 mL | INTRAVENOUS | Status: DC | PRN

## 2018-11-10 MED ORDER — LUBIPROSTONE 24 MCG PO CAPS: 24.0000 ug | ORAL_CAPSULE | Freq: Two times a day (BID) | ORAL | Status: AC

## 2018-11-10 MED ORDER — CEFAZOLIN IV (FOR PTA / DISCHARGE USE ONLY): 2.0000 g | Freq: Two times a day (BID) | INTRAVENOUS | 0 refills | Status: AC

## 2018-11-10 NOTE — Progress Notes (Signed)
11/10/2018 6:13 PM  Report given to [facility].   Carney Corners

## 2018-11-10 NOTE — Progress Notes (Signed)
Called Micro- No growth at 72 hrs on blood culture drawn 11/07/18. Placed consult to IV team for picc line placement.   Lars Mage, MD Internal Medicine PGY2 Pager:[phone] 11/10/2018, 9:07 AM

## 2018-11-10 NOTE — Progress Notes (Signed)
Texted Dr. Maricela Bo concerning placement of PICC. She stated to move forward with the PICC as her CKD isn't an issue and wouldn't need HD.

## 2018-11-10 NOTE — Progress Notes (Addendum)
Peripherally Inserted Central Catheter/Midline Placement  The IV Nurse has discussed with the patient and/or persons authorized to consent for the patient, the purpose of this procedure and the potential benefits and risks involved with this procedure.  The benefits include less needle sticks, lab draws from the catheter, and the patient may be discharged home with the catheter. Risks include, but not limited to, infection, bleeding, blood clot (thrombus formation), and puncture of an artery; nerve damage and irregular heartbeat and possibility to perform a PICC exchange if needed/ordered by physician.  Alternatives to this procedure were also discussed.  Bard Power PICC patient education guide, fact sheet on infection prevention and patient information card has been provided to patient /or left at bedside.  Consent signed by son per patient's request.   PICC/Midline Placement Documentation  PICC Single Lumen 35/45/62 PICC Right Basilic 35 cm 0 cm (Active)  Indication for Insertion or Continuance of Line Home intravenous therapies (PICC only) 11/10/2018  3:00 PM  Exposed Catheter (cm) 0 cm 11/10/2018  3:00 PM  Site Assessment Clean;Intact;Dry 11/10/2018  3:00 PM  Line Status Flushed;Blood return noted;Saline locked 11/10/2018  3:00 PM  Dressing Type Transparent 11/10/2018  3:00 PM  Dressing Status Clean;Dry;Intact;Antimicrobial disc in place 11/10/2018  3:00 PM  Line Care Connections checked and tightened 11/10/2018  3:00 PM  Dressing Change Due 11/17/18 11/10/2018  3:00 PM       Lorenza Cambridge 11/10/2018, 3:23 PM

## 2018-11-10 NOTE — Progress Notes (Signed)
   Subjective:   [name] was seen resting in her bed this morning. She stated that she is doing well without any chest pain or difficulty breathing. She does not have any pain currently. She last had a bowel movement the day before.   Objective:  Vital signs in last 24 hours: Vitals:   11/09/18 1500 11/09/18 1759 11/09/18 2004 11/10/18 0408  BP:  120/73 (!) 108/95 130/70  Pulse: (!) 162 88 92 99  Resp: 16  (!) 25 (!) 21  Temp:  98.5 F (36.9 C) 99.5 F (37.5 C) 98.9 F (37.2 C)  TempSrc:  Oral Rectal Oral  SpO2: 93% 99% 100% 98%  Height:       Physical Exam  Constitutional: Appears well-developed and well-nourished. No distress.  HENT:  Head: Normocephalic and atraumatic.  Eyes: Conjunctivae are normal.  Cardiovascular: Normal rate, regular rhythm and normal heart sounds.  Respiratory: Effort normal and breath sounds normal. No respiratory distress. No wheezes.  GI: Soft. Bowel sounds are normal. No distension. There is no tenderness.  Musculoskeletal: No edema. No inflammation or swelling at site of amputation.  Neurological: Is alert.  Skin: Not diaphoretic. No erythema.  Psychiatric: Normal mood and affect. Behavior is normal. Judgment and thought content normal.    Assessment/Plan:  [name] is a 84 y.o f with CAD, afib/flutter, ckd4, alzheimer's dementia, pvd with right aka who presented with left lower extremity nonhealing wounds and systemic signs of infection (fever and leukocytosis). Patient had left aka on 12/3 to control infection.   MSSA Bacteremia Patient continues to be afebrile and without leukocytosis. Blood cultures from 12/4 72hrs without any growth. Will place picc line today and discharge with 4 week of IV cefazolin. Patient will be followed by the infectious disease OPAT team and be closely monitored for any side effects. Patient to be discharged today and follow up with ID in 4 weeks.   Nonhealing cellulitis s/p left aka 12/3 Patient's  dressing is clean without any discharge, erythema, inflammation, or pain. Physical therapy recommended hoyer lift pad  Hyperkalemia Patient repored to have k=6.1 on hemolyzed blood. Repeating bmp. Will give kayexelate if potassium continues to be elevated.   CKD4 Cr=1.75 today which is baseline.   Constipation The patient's last bowel movement was yesterday 12/6. Will continue miralax17g daily, senna 2 tablets bid, lubiprostone 24 mcg bid, dulcolax 10mg  qd prn.  Dispo: Anticipated discharge today  Lars Mage, MD 11/10/2018, 9:08 AM Pager: [phone]

## 2018-11-10 NOTE — Progress Notes (Signed)
Clinical Social Worker facilitated patient discharge including contacting patient family and facility to confirm patient discharge plans.  Clinical information faxed to facility and family agreeable with plan.  CSW arranged ambulance transport via [facility] to [facility].  RN to call [phone] (rm#[id number])  report prior to discharge.[facility] called for a 4:30 pick up per RN request  Clinical Social Worker will sign off for now as social work intervention is no longer needed. Please consult [place] again if new need arises.  Rhea Pink, MSW, Grandfather

## 2018-11-10 NOTE — Progress Notes (Signed)
11/10/2018 6:08 PM Pt transported to [facility] via [facility].  Report to be called to [facility]. [place]

## 2018-11-12 ENCOUNTER — Encounter (HOSPITAL_COMMUNITY): Admission: RE | Payer: Self-pay | Source: Ambulatory Visit

## 2018-11-12 ENCOUNTER — Inpatient Hospital Stay (HOSPITAL_COMMUNITY)
Admission: RE | Admit: 2018-11-12 | Payer: Medicare (Managed Care) | Source: Ambulatory Visit | Admitting: Vascular Surgery

## 2018-11-12 LAB — CULTURE, BLOOD (ROUTINE X 2)
Culture: NO GROWTH
Culture: NO GROWTH
Special Requests: ADEQUATE

## 2018-11-12 SURGERY — AMPUTATION, ABOVE KNEE
Anesthesia: General | Laterality: Left

## 2018-11-12 NOTE — Progress Notes (Signed)
Please call Ms. Randol to let her know that her blood cultures were clear and no signs of infection. Hopeful she continues to do well!

## 2018-11-13 ENCOUNTER — Telehealth: Payer: Self-pay

## 2018-11-13 NOTE — Telephone Encounter (Signed)
Called patients and spoke with Myra (Daughter-in law) and informed of abs results. Family very appreciate of cal.

## 2018-11-13 NOTE — Telephone Encounter (Signed)
-----   Message from [name], NP sent at 11/12/2018  4:38 PM EST ----- Please call [name] to let her know that her blood cultures were clear and no signs of infection. Hopeful she continues to do well!

## 2018-11-20 LAB — CBC AND DIFFERENTIAL
HCT: 25 — AB (ref 36–46)
Hemoglobin: 8.2 — AB (ref 12.0–16.0)
Platelets: 297 (ref 150–399)
WBC: 9.6

## 2018-11-20 LAB — BASIC METABOLIC PANEL
BUN: 34 — AB (ref 4–21)
CREATININE: 1.4 — AB (ref 0.5–1.1)
Glucose: 85
Potassium: 2.9 — AB (ref 3.4–5.3)
Sodium: 140 (ref 137–147)

## 2018-12-03 ENCOUNTER — Telehealth: Payer: Self-pay | Admitting: *Deleted

## 2018-12-03 NOTE — Telephone Encounter (Signed)
Phone call from Dr. Jimmye Norman of [facility]. Update Condition report.Wound healing well.  Patient developed DVT of right upper arm where she has a PIC line. Was started on Eliquis. Labs are good.  Swelling is much better.  She will complete IV antibiotics on 12/05/2018 and have PIC line removed. Will have a follow -up cultures by Dr. Jimmye Norman. Call Dr. Jimmye Norman if any questions [phone].

## 2018-12-07 ENCOUNTER — Ambulatory Visit (INDEPENDENT_AMBULATORY_CARE_PROVIDER_SITE_OTHER): Payer: Self-pay | Admitting: Physician Assistant

## 2018-12-07 ENCOUNTER — Other Ambulatory Visit: Payer: Self-pay

## 2018-12-07 VITALS — BP 120/78 | HR 62 | Temp 97.1°F | Resp 18

## 2018-12-07 DIAGNOSIS — Z89612 Acquired absence of left leg above knee: Secondary | ICD-10-CM

## 2018-12-07 NOTE — Progress Notes (Signed)
POST OPERATIVE OFFICE NOTE    CC:  F/u for surgery  HPI:  This is a 85 y.o. female who is s/p Left above knee amputation on 11/06/18 by Dr. Donzetta Matters.  She had multiple non healing wounds of the left leg.  She has a hx of right AKA that was done in October 2012 and in non ambulatory.  She had +blood cx in the ED and was placed on IV abx and had a PICC line.  She was scheduled to finish her abx on 12/05/18.  She had been scheduled for the amputation on 11/12/18 by Dr. Scot Dock, but given the +blood cx (Staph aureus) , she was taken to the OR on 11/06/18 when she presented to the ED with fever.  She developed a DVT of the RUE where she had the PICC line and was placed on Eliquis.  Swelling improved.   She presents today for follow up.  She states that she does have some phantom pain in her leg but continues to have some phantom pain in the right leg as well.  Daughter states that she had the PICC line removed in the past couple of days.     Allergies  Allergen Reactions  . Morphine And Related Anaphylaxis  . Penicillins Other (See Comments) and Nausea And Vomiting    Makes patient sick Has patient had a PCN reaction causing immediate rash, facial/tongue/throat swelling, SOB or lightheadedness with hypotension: No Has patient had a PCN reaction causing severe rash involving mucus membranes or skin necrosis: No Has patient had a PCN reaction that required hospitalization: No Has patient had a PCN reaction occurring within the last 10 years: No If all of the above answers are "NO", then may proceed with Cephalosporin use.     Current Outpatient Medications  Medication Sig Dispense Refill  . acetaminophen (TYLENOL) 325 MG tablet Take 650 mg by mouth every 6 (six) hours as needed (pain). Max dose 3000 mg/24 hours    . Amino Acids-Protein Hydrolys (FEEDING SUPPLEMENT, PRO-STAT SUGAR FREE 64,) LIQD Take 30 mLs by mouth 2 (two) times daily with a meal.    . amiodarone (PACERONE) 100 MG tablet Take 100 mg by  mouth daily.    Marland Kitchen aspirin EC 81 MG EC tablet Take 1 tablet (81 mg total) by mouth daily.    . bisacodyl (DULCOLAX) 10 MG suppository Place 10 mg rectally daily as needed (constipation not relieved by MOM).    . calcium citrate (CALCITRATE - DOSED IN MG ELEMENTAL CALCIUM) 950 MG tablet Take 200 mg of elemental calcium by mouth 2 (two) times daily with a meal.     . ceFAZolin (ANCEF) IVPB Inject 2 g into the vein every 12 (twelve) hours for 27 days. Indication:  MSSA bacteremia Last Day of Therapy:  12/05/18 Labs - Once weekly:  CBC/D and BMP, Labs - Every other week:  ESR and CRP 54 Units 0  . Coenzyme Q10 100 MG capsule Take 200 mg by mouth daily.     . Darbepoetin Alfa (ARANESP) 60 MCG/0.3ML SOSY injection Inject 60 mcg into the skin every 14 (fourteen) days. Every other Wednesday    . feeding supplement, ENSURE ENLIVE, (ENSURE ENLIVE) LIQD Take 237 mLs by mouth daily at 2 PM.     . furosemide (LASIX) 40 MG tablet Take 40 mg by mouth daily.    Marland Kitchen levothyroxine (SYNTHROID, LEVOTHROID) 88 MCG tablet Take 88 mcg by mouth daily before breakfast.    . lubiprostone (AMITIZA) 24 MCG  capsule Take 1 capsule (24 mcg total) by mouth 2 (two) times daily with a meal.    . Magnesium Hydroxide (MILK OF MAGNESIA PO) Take 30 mLs by mouth daily as needed (constipation).    . ondansetron (ZOFRAN) 4 MG tablet Take 4 mg by mouth every 8 (eight) hours as needed for nausea.     Marland Kitchen oxyCODONE-acetaminophen (PERCOCET/ROXICET) 5-325 MG tablet Take by mouth every 6 (six) hours as needed (left leg pain).    . pantoprazole (PROTONIX) 20 MG tablet Take 20 mg by mouth daily before breakfast.     . pravastatin (PRAVACHOL) 40 MG tablet Take 40 mg by mouth at bedtime.     . sennosides-docusate sodium (SENOKOT-S) 8.6-50 MG tablet Take 2 tablets by mouth 2 (two) times daily.     . Sodium Phosphates (FLEET ENEMA RE) Place 1 enema rectally daily as needed (constipation not relieved by Bisacodyl suppository).    . traMADol (ULTRAM) 50 MG  tablet Take 1 tablet (50 mg total) by mouth every 12 (twelve) hours as needed for severe pain. (Patient not taking: Reported on 11/05/2018) 5 tablet 0   No current facility-administered medications for this visit.      ROS:  See HPI  Physical Exam:  Today's Vitals   12/07/18 1450  BP: 120/78  Pulse: 62  Resp: 18  Temp: (!) 97.1 F (36.2 C)  SpO2: 96%   There is no height or weight on file to calculate BMI.   Incision:  Well healed with staples in place   Assessment/Plan:  This is a 85 y.o. female who is s/p: Left above knee amputation on 11/06/18 by Dr. Donzetta Matters  -incision has healed nicely.  Will remove staples today.  Pt is non ambulatory and resides in SNF. -PICC line removed in the past couple of days per daughter and pt is on Eliquis for DVT -f/u with [facility] as needed.   Leontine Locket, PA-C [facility] [phone]  Clinic MD:  Donzetta Matters

## 2018-12-12 ENCOUNTER — Other Ambulatory Visit: Payer: Self-pay | Admitting: Family Medicine

## 2018-12-12 DIAGNOSIS — R5381 Other malaise: Secondary | ICD-10-CM

## 2018-12-13 ENCOUNTER — Other Ambulatory Visit (HOSPITAL_COMMUNITY): Payer: Self-pay | Admitting: Family Medicine

## 2018-12-13 DIAGNOSIS — H547 Unspecified visual loss: Secondary | ICD-10-CM

## 2018-12-13 DIAGNOSIS — R471 Dysarthria and anarthria: Secondary | ICD-10-CM

## 2018-12-17 ENCOUNTER — Other Ambulatory Visit (HOSPITAL_COMMUNITY): Payer: Self-pay

## 2018-12-17 DIAGNOSIS — R1310 Dysphagia, unspecified: Secondary | ICD-10-CM

## 2018-12-19 ENCOUNTER — Ambulatory Visit (HOSPITAL_COMMUNITY)
Admission: RE | Admit: 2018-12-19 | Discharge: 2018-12-19 | Disposition: A | Discharge status: Home / Self Care | Payer: Medicare (Managed Care) | Source: Ambulatory Visit | Attending: Family Medicine | Admitting: Family Medicine

## 2018-12-19 DIAGNOSIS — H547 Unspecified visual loss: Secondary | ICD-10-CM | POA: Insufficient documentation

## 2018-12-19 DIAGNOSIS — R471 Dysarthria and anarthria: Secondary | ICD-10-CM | POA: Diagnosis present

## 2018-12-24 ENCOUNTER — Encounter: Payer: Medicare (Managed Care) | Admitting: Surgery

## 2018-12-24 ENCOUNTER — Encounter (HOSPITAL_COMMUNITY): Payer: Medicare (Managed Care)

## 2018-12-31 ENCOUNTER — Ambulatory Visit (HOSPITAL_COMMUNITY)
Admission: RE | Admit: 2018-12-31 | Discharge: 2018-12-31 | Disposition: A | Discharge status: Home / Self Care | Payer: Medicare (Managed Care) | Source: Ambulatory Visit | Attending: Internal Medicine | Admitting: Internal Medicine

## 2018-12-31 DIAGNOSIS — R1314 Dysphagia, pharyngoesophageal phase: Secondary | ICD-10-CM | POA: Insufficient documentation

## 2018-12-31 DIAGNOSIS — R1310 Dysphagia, unspecified: Secondary | ICD-10-CM | POA: Diagnosis present

## 2018-12-31 NOTE — Therapy (Signed)
Modified Barium Swallow Progress Note  Patient Details  Name: [name] MRN: [id number] Date of Birth: 1925/11/29  Today's Date: 12/31/2018  Modified Barium Swallow completed.  Full report located under Chart Review in the Imaging Section.  Brief recommendations include the following:  Clinical Impression  Pt exhibited functional ability to contain, manipulate and transit bolus to oropharynx. Masticated solid graham cracker texture in timely manner without pharyngeal residue. Dysphagia appears to be primary esophageal intermittently affecting pharyngeal phase leading to transient flash penetration noted on all textures but not consistently. Material entering laryngeal vestibule was ejected during the swallow and cup versus straw did not present a significant difference. Cricopharyngeus muscle is prominent with intermittent incomplete UES relaxation. Scan of esophagus revealed mild-mod (?) residue and appearance of decreased peristalsis although MBS does not diagnose esophageal dysmotility, narrowing etc. There is no need to recommend modification of liquids or food texture at this time. Daughter stated pt recently started on meds for GERD. Verbal and visual (handout) education and discussion of reflux/esophageal strategies to decrease symptoms. Daughter voiced understanding. If symptoms significantly increase further work up of esophagus could be performed if beneficial to make a significant difference in her goals of care at her age (sit up 30 min after meals, throat clear intermittently after meals, alternate liquids and solids, upright position)        Swallow Evaluation Recommendations       SLP Diet Recommendations: Regular solids;Thin liquid   Liquid Administration via: Straw;Cup   Medication Administration: Whole meds with puree   Supervision: Patient able to self feed;Full supervision/cueing for compensatory strategies   Compensations: Slow rate;Small sips/bites;Clear throat  intermittently   Postural Changes: Seated upright at 90 degrees;Remain semi-upright after after feeds/meals (Comment)   Oral Care Recommendations: Oral care BID        Houston Siren 12/31/2018,12:24 PM   [name].Ed Risk analyst [phone] Office [phone]

## 2019-04-05 DEATH — deceased

## 2019-12-14 IMAGING — DX DG FOOT COMPLETE 3+V*L*
3 series · 3 of 3 positions shown · non-contrast
Comparison: 12/18/2012

CLINICAL DATA: Fever.  Left foot pain.

EXAM:
LEFT FOOT - COMPLETE 3+ VIEW

[foot ap]
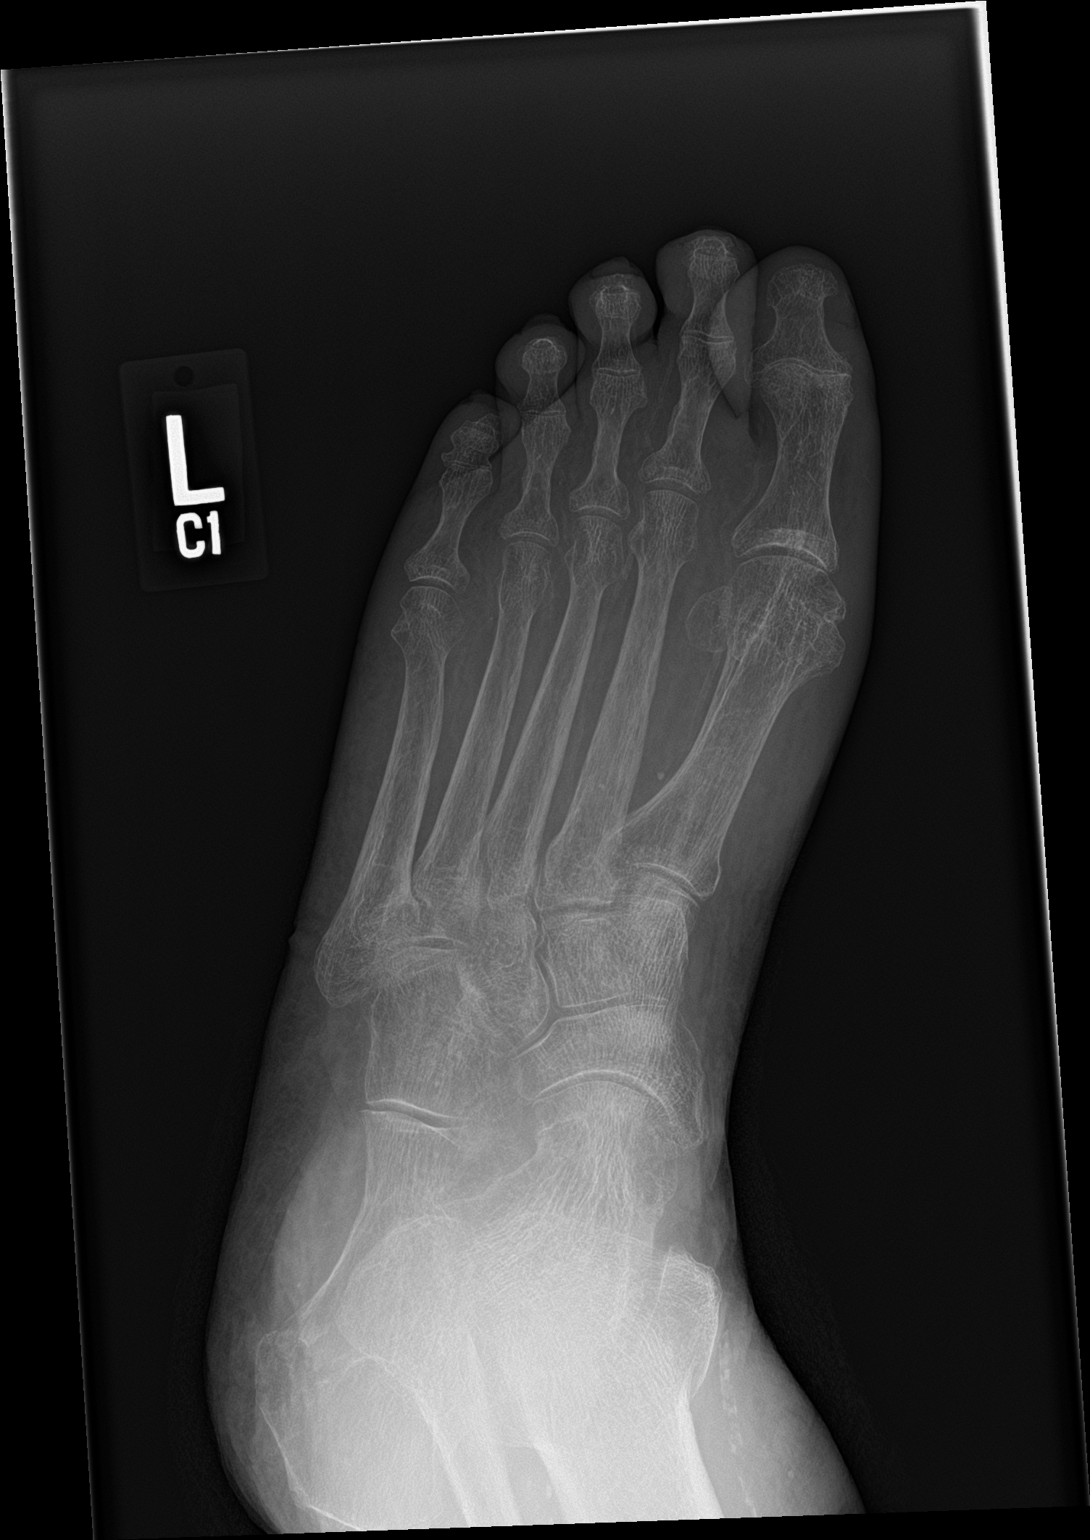

[foot obl]
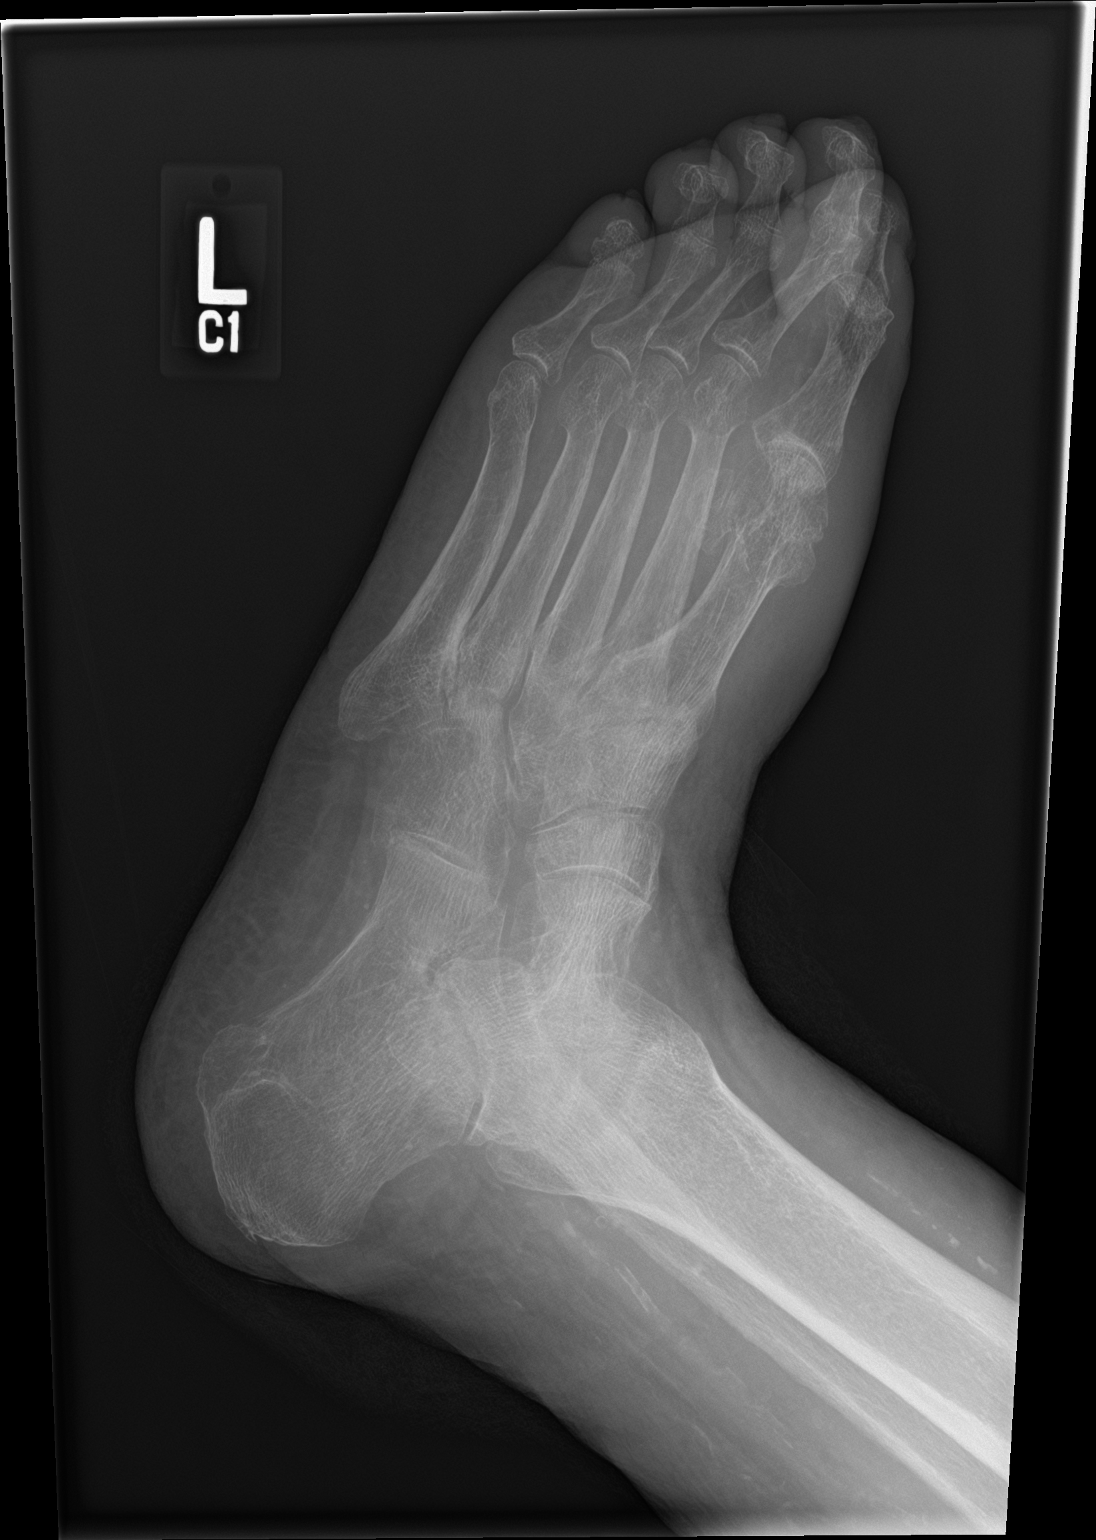

[foot lat]
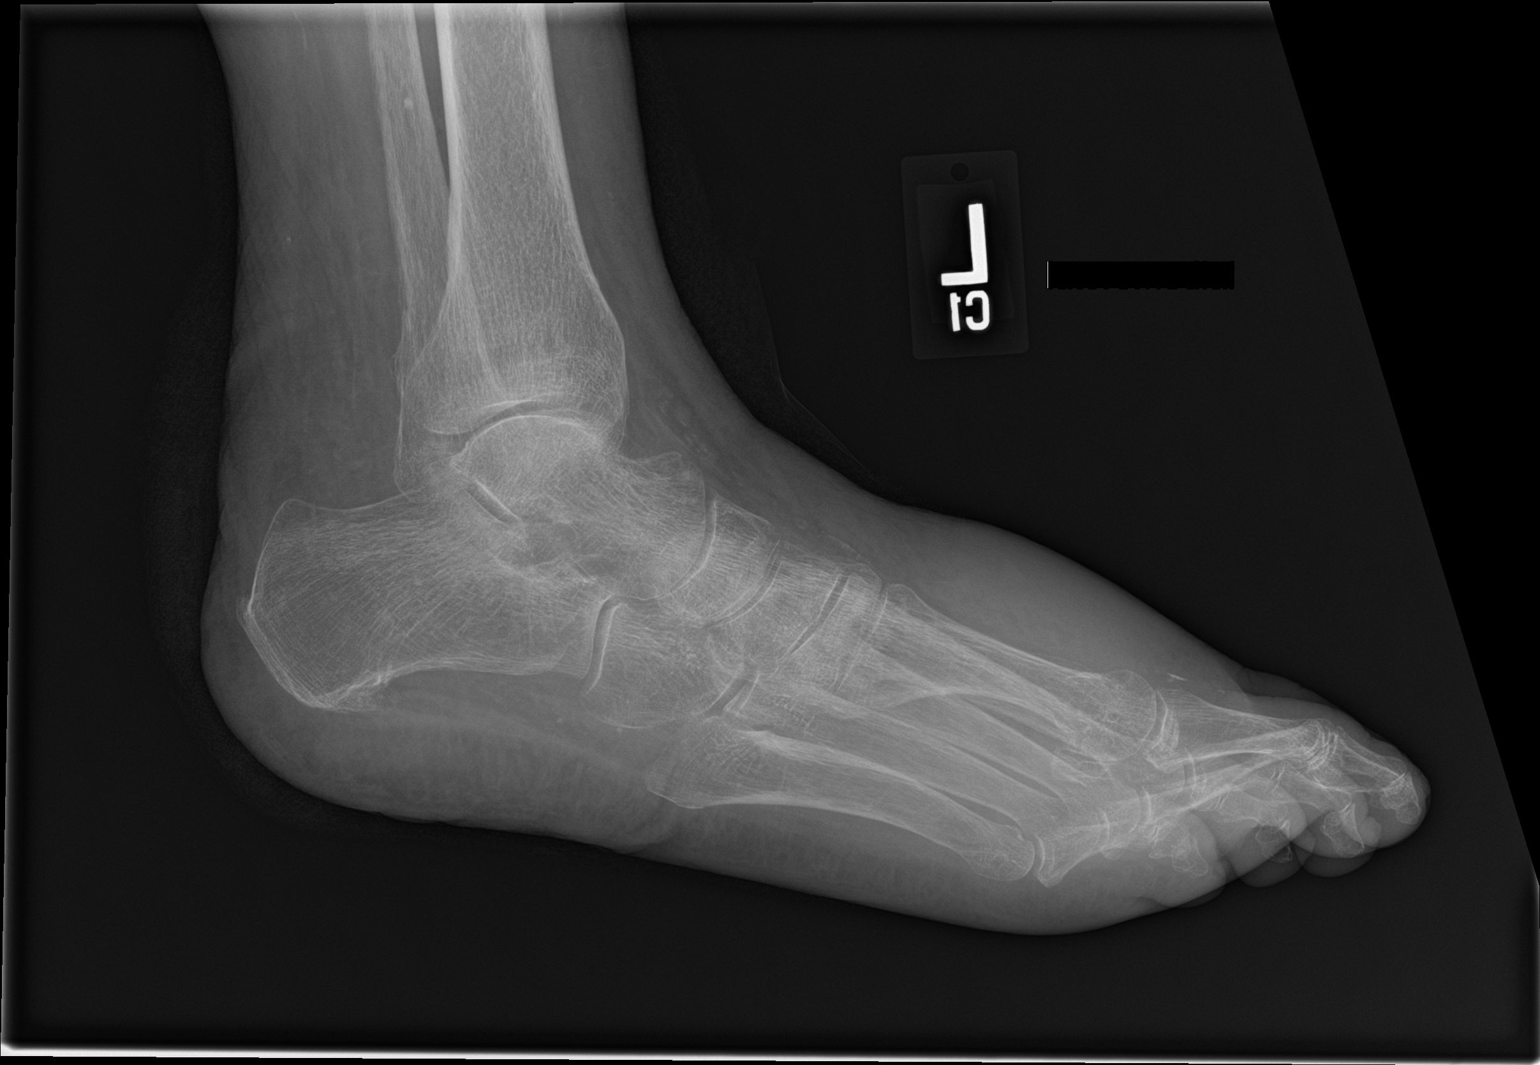

[3 of 3 positions shown; findings below may reference images not displayed]

FINDINGS: Moderate osteoarthritic changes at the 1st MTP joint. Diffuse
osteopenia. No bone destruction to suggest osteomyelitis. Soft
tissue swelling along the dorsum of the foot.
IMPRESSION: No radiographic changes of osteomyelitis. Diffuse osteopenia. No
acute bony abnormality.
